# Patient Record
Sex: Male | Born: 1960 | Race: White | Hispanic: No | Marital: Single | Smoking: Former smoker
Health system: Southern US, Community
[De-identification: ages and names within clinical notes are randomized; demographics above are authoritative.]

## PROBLEM LIST (undated history)

## (undated) DIAGNOSIS — I82409 Acute embolism and thrombosis of unspecified deep veins of unspecified lower extremity: Secondary | ICD-10-CM

## (undated) DIAGNOSIS — I10 Essential (primary) hypertension: Secondary | ICD-10-CM

---

## 2020-11-12 ENCOUNTER — Encounter (HOSPITAL_COMMUNITY): Payer: Self-pay | Admitting: Student

## 2020-11-12 ENCOUNTER — Emergency Department (HOSPITAL_COMMUNITY): Payer: Self-pay

## 2020-11-12 ENCOUNTER — Inpatient Hospital Stay (HOSPITAL_COMMUNITY)
Admission: EM | Admit: 2020-11-12 | Discharge: 2020-11-18 | DRG: 270 | Disposition: A | Discharge status: Home / Self Care | Payer: Self-pay | Attending: Internal Medicine | Admitting: Internal Medicine

## 2020-11-12 ENCOUNTER — Emergency Department (HOSPITAL_BASED_OUTPATIENT_CLINIC_OR_DEPARTMENT_OTHER): Payer: Self-pay

## 2020-11-12 ENCOUNTER — Other Ambulatory Visit: Payer: Self-pay

## 2020-11-12 DIAGNOSIS — I82411 Acute embolism and thrombosis of right femoral vein: Secondary | ICD-10-CM

## 2020-11-12 DIAGNOSIS — M79605 Pain in left leg: Secondary | ICD-10-CM

## 2020-11-12 DIAGNOSIS — I82422 Acute embolism and thrombosis of left iliac vein: Secondary | ICD-10-CM

## 2020-11-12 DIAGNOSIS — E222 Syndrome of inappropriate secretion of antidiuretic hormone: Secondary | ICD-10-CM | POA: Diagnosis present

## 2020-11-12 DIAGNOSIS — I82451 Acute embolism and thrombosis of right peroneal vein: Secondary | ICD-10-CM | POA: Diagnosis present

## 2020-11-12 DIAGNOSIS — Z79891 Long term (current) use of opiate analgesic: Secondary | ICD-10-CM

## 2020-11-12 DIAGNOSIS — I10 Essential (primary) hypertension: Secondary | ICD-10-CM

## 2020-11-12 DIAGNOSIS — I7092 Chronic total occlusion of artery of the extremities: Secondary | ICD-10-CM | POA: Diagnosis present

## 2020-11-12 DIAGNOSIS — I824Y3 Acute embolism and thrombosis of unspecified deep veins of proximal lower extremity, bilateral: Secondary | ICD-10-CM

## 2020-11-12 DIAGNOSIS — I82441 Acute embolism and thrombosis of right tibial vein: Secondary | ICD-10-CM

## 2020-11-12 DIAGNOSIS — I82413 Acute embolism and thrombosis of femoral vein, bilateral: Principal | ICD-10-CM | POA: Diagnosis present

## 2020-11-12 DIAGNOSIS — D62 Acute posthemorrhagic anemia: Secondary | ICD-10-CM | POA: Diagnosis not present

## 2020-11-12 DIAGNOSIS — I82433 Acute embolism and thrombosis of popliteal vein, bilateral: Secondary | ICD-10-CM | POA: Diagnosis present

## 2020-11-12 DIAGNOSIS — I2609 Other pulmonary embolism with acute cor pulmonale: Secondary | ICD-10-CM

## 2020-11-12 DIAGNOSIS — Z20822 Contact with and (suspected) exposure to covid-19: Secondary | ICD-10-CM | POA: Diagnosis present

## 2020-11-12 DIAGNOSIS — Z7901 Long term (current) use of anticoagulants: Secondary | ICD-10-CM

## 2020-11-12 DIAGNOSIS — I82431 Acute embolism and thrombosis of right popliteal vein: Secondary | ICD-10-CM

## 2020-11-12 DIAGNOSIS — Z85828 Personal history of other malignant neoplasm of skin: Secondary | ICD-10-CM

## 2020-11-12 DIAGNOSIS — Z86711 Personal history of pulmonary embolism: Secondary | ICD-10-CM

## 2020-11-12 DIAGNOSIS — J430 Unilateral pulmonary emphysema [MacLeod's syndrome]: Secondary | ICD-10-CM | POA: Diagnosis present

## 2020-11-12 DIAGNOSIS — D6859 Other primary thrombophilia: Secondary | ICD-10-CM | POA: Diagnosis present

## 2020-11-12 DIAGNOSIS — I82461 Acute embolism and thrombosis of right calf muscular vein: Secondary | ICD-10-CM | POA: Diagnosis present

## 2020-11-12 DIAGNOSIS — Z6831 Body mass index (BMI) 31.0-31.9, adult: Secondary | ICD-10-CM

## 2020-11-12 DIAGNOSIS — I82409 Acute embolism and thrombosis of unspecified deep veins of unspecified lower extremity: Secondary | ICD-10-CM

## 2020-11-12 DIAGNOSIS — G8929 Other chronic pain: Secondary | ICD-10-CM | POA: Diagnosis present

## 2020-11-12 DIAGNOSIS — M549 Dorsalgia, unspecified: Secondary | ICD-10-CM | POA: Diagnosis present

## 2020-11-12 DIAGNOSIS — Z86718 Personal history of other venous thrombosis and embolism: Secondary | ICD-10-CM

## 2020-11-12 DIAGNOSIS — I2699 Other pulmonary embolism without acute cor pulmonale: Secondary | ICD-10-CM | POA: Diagnosis present

## 2020-11-12 DIAGNOSIS — I2510 Atherosclerotic heart disease of native coronary artery without angina pectoris: Secondary | ICD-10-CM | POA: Diagnosis present

## 2020-11-12 DIAGNOSIS — T45516A Underdosing of anticoagulants, initial encounter: Secondary | ICD-10-CM | POA: Diagnosis present

## 2020-11-12 DIAGNOSIS — E278 Other specified disorders of adrenal gland: Secondary | ICD-10-CM | POA: Diagnosis present

## 2020-11-12 DIAGNOSIS — Z79899 Other long term (current) drug therapy: Secondary | ICD-10-CM

## 2020-11-12 DIAGNOSIS — Z91120 Patient's intentional underdosing of medication regimen due to financial hardship: Secondary | ICD-10-CM

## 2020-11-12 DIAGNOSIS — M79604 Pain in right leg: Secondary | ICD-10-CM

## 2020-11-12 DIAGNOSIS — I70202 Unspecified atherosclerosis of native arteries of extremities, left leg: Secondary | ICD-10-CM | POA: Diagnosis present

## 2020-11-12 DIAGNOSIS — Z87891 Personal history of nicotine dependence: Secondary | ICD-10-CM

## 2020-11-12 DIAGNOSIS — M7989 Other specified soft tissue disorders: Secondary | ICD-10-CM

## 2020-11-12 DIAGNOSIS — I82423 Acute embolism and thrombosis of iliac vein, bilateral: Secondary | ICD-10-CM | POA: Diagnosis present

## 2020-11-12 DIAGNOSIS — E669 Obesity, unspecified: Secondary | ICD-10-CM | POA: Diagnosis present

## 2020-11-12 DIAGNOSIS — Z597 Insufficient social insurance and welfare support: Secondary | ICD-10-CM

## 2020-11-12 DIAGNOSIS — I82220 Acute embolism and thrombosis of inferior vena cava: Secondary | ICD-10-CM | POA: Diagnosis present

## 2020-11-12 DIAGNOSIS — Z95828 Presence of other vascular implants and grafts: Secondary | ICD-10-CM

## 2020-11-12 HISTORY — DX: Essential (primary) hypertension: I10

## 2020-11-12 HISTORY — DX: Acute embolism and thrombosis of unspecified deep veins of unspecified lower extremity: I82.409

## 2020-11-12 LAB — BASIC METABOLIC PANEL
Anion gap: 10 (ref 5–15)
BUN: 19 mg/dL (ref 6–20)
CO2: 25 mmol/L (ref 22–32)
Calcium: 9.3 mg/dL (ref 8.9–10.3)
Chloride: 94 mmol/L — ABNORMAL LOW (ref 98–111)
Creatinine, Ser: 1.18 mg/dL (ref 0.61–1.24)
GFR, Estimated: >60 mL/min (ref >60)
Glucose, Bld: 100 mg/dL — ABNORMAL HIGH (ref 70–99)
Potassium: 4.5 mmol/L (ref 3.5–5.1)
Sodium: 129 mmol/L — ABNORMAL LOW (ref 135–145)

## 2020-11-12 LAB — CBC
HCT: 41.3 % (ref 39.0–52.0)
Hemoglobin: 13.7 g/dL (ref 13.0–17.0)
MCH: 30.2 pg (ref 26.0–34.0)
MCHC: 33.2 g/dL (ref 30.0–36.0)
MCV: 91.2 fL (ref 80.0–100.0)
Platelets: 205 10*3/uL (ref 150–400)
RBC: 4.53 MIL/uL (ref 4.22–5.81)
RDW: 13.9 % (ref 11.5–15.5)
WBC: 7.5 10*3/uL (ref 4.0–10.5)
nRBC: 0 % (ref 0.0–0.2)

## 2020-11-12 LAB — BRAIN NATRIURETIC PEPTIDE: B Natriuretic Peptide: 15.5 pg/mL (ref 0.0–100.0)

## 2020-11-12 LAB — PROCALCITONIN: Procalcitonin: <0.1 ng/mL

## 2020-11-12 LAB — RESP PANEL BY RT-PCR (FLU A&B, COVID) ARPGX2
Influenza A by PCR: NEGATIVE
Influenza B by PCR: NEGATIVE
SARS Coronavirus 2 by RT PCR: NEGATIVE

## 2020-11-12 LAB — APTT
aPTT: 36 seconds (ref 24–36)
aPTT: 75 seconds — ABNORMAL HIGH (ref 24–36)

## 2020-11-12 LAB — MAGNESIUM: Magnesium: 2.2 mg/dL (ref 1.7–2.4)

## 2020-11-12 LAB — PROTIME-INR
INR: 1.1 (ref 0.8–1.2)
Prothrombin Time: 14.2 seconds (ref 11.4–15.2)

## 2020-11-12 LAB — PHOSPHORUS: Phosphorus: 3.3 mg/dL (ref 2.5–4.6)

## 2020-11-12 LAB — TROPONIN I (HIGH SENSITIVITY): Troponin I (High Sensitivity): 40 ng/L — ABNORMAL HIGH (ref <18)

## 2020-11-12 LAB — HEPARIN LEVEL (UNFRACTIONATED): Heparin Unfractionated: >1.1 IU/mL — ABNORMAL HIGH (ref 0.30–0.70)

## 2020-11-12 LAB — OSMOLALITY: Osmolality: 270 mOsm/kg — ABNORMAL LOW (ref 275–295)

## 2020-11-12 MED ORDER — HYDROMORPHONE HCL 1 MG/ML IJ SOLN
0.5000 mg | INTRAMUSCULAR | Status: DC | PRN
  Administered 2020-11-12 – 2020-11-15 (×8): 1 mg via INTRAVENOUS
  Filled 2020-11-12 (×9): qty 1

## 2020-11-12 MED ORDER — POLYETHYLENE GLYCOL 3350 17 G PO PACK: 17.0000 g | PACK | Freq: Every day | ORAL | Status: DC | PRN

## 2020-11-12 MED ORDER — ACETAMINOPHEN 325 MG PO TABS: 650.0000 mg | ORAL_TABLET | Freq: Four times a day (QID) | ORAL | Status: DC | PRN

## 2020-11-12 MED ORDER — THIAMINE HCL 100 MG/ML IJ SOLN: 100.0000 mg | Freq: Every day | INTRAMUSCULAR | Status: DC

## 2020-11-12 MED ORDER — AMLODIPINE BESYLATE 5 MG PO TABS
5.0000 mg | ORAL_TABLET | Freq: Every day | ORAL | Status: DC
  Administered 2020-11-12 – 2020-11-18 (×6): 5 mg via ORAL
  Filled 2020-11-12 (×6): qty 1

## 2020-11-12 MED ORDER — GABAPENTIN 100 MG PO CAPS
100.0000 mg | ORAL_CAPSULE | Freq: Three times a day (TID) | ORAL | Status: DC
  Administered 2020-11-13 – 2020-11-18 (×15): 100 mg via ORAL
  Filled 2020-11-12 (×15): qty 1

## 2020-11-12 MED ORDER — HEPARIN (PORCINE) 25000 UT/250ML-% IV SOLN
2100.0000 [IU]/h | INTRAVENOUS | Status: DC
  Administered 2020-11-12: 1700 [IU]/h via INTRAVENOUS
  Administered 2020-11-13: 2100 [IU]/h via INTRAVENOUS
  Administered 2020-11-13: 1700 [IU]/h via INTRAVENOUS
  Administered 2020-11-14: 2100 [IU]/h via INTRAVENOUS
  Filled 2020-11-12 (×4): qty 250

## 2020-11-12 MED ORDER — ADULT MULTIVITAMIN W/MINERALS CH
1.0000 | ORAL_TABLET | Freq: Every day | ORAL | Status: DC
  Administered 2020-11-12 – 2020-11-18 (×6): 1 via ORAL
  Filled 2020-11-12 (×6): qty 1

## 2020-11-12 MED ORDER — THIAMINE HCL 100 MG PO TABS
100.0000 mg | ORAL_TABLET | Freq: Every day | ORAL | Status: DC
  Administered 2020-11-12 – 2020-11-18 (×6): 100 mg via ORAL
  Filled 2020-11-12 (×6): qty 1

## 2020-11-12 MED ORDER — LORAZEPAM 2 MG/ML IJ SOLN: 1.0000 mg | INTRAMUSCULAR | Status: AC | PRN

## 2020-11-12 MED ORDER — ACETAMINOPHEN 650 MG RE SUPP: 650.0000 mg | Freq: Four times a day (QID) | RECTAL | Status: DC | PRN

## 2020-11-12 MED ORDER — MORPHINE SULFATE (PF) 4 MG/ML IV SOLN
4.0000 mg | Freq: Once | INTRAVENOUS | Status: AC
  Administered 2020-11-12: 4 mg via INTRAVENOUS
  Filled 2020-11-12: qty 1

## 2020-11-12 MED ORDER — HEPARIN BOLUS VIA INFUSION
6000.0000 [IU] | Freq: Once | INTRAVENOUS | Status: AC
  Administered 2020-11-12: 6000 [IU] via INTRAVENOUS
  Filled 2020-11-12: qty 6000

## 2020-11-12 MED ORDER — LORAZEPAM 1 MG PO TABS
1.0000 mg | ORAL_TABLET | ORAL | Status: AC | PRN
  Administered 2020-11-14: 2 mg via ORAL
  Filled 2020-11-12: qty 2

## 2020-11-12 MED ORDER — CYCLOBENZAPRINE HCL 10 MG PO TABS
10.0000 mg | ORAL_TABLET | Freq: Three times a day (TID) | ORAL | Status: DC
  Administered 2020-11-13 – 2020-11-18 (×15): 10 mg via ORAL
  Filled 2020-11-12 (×17): qty 1

## 2020-11-12 MED ORDER — AMOXICILLIN-POT CLAVULANATE 875-125 MG PO TABS
1.0000 | ORAL_TABLET | Freq: Two times a day (BID) | ORAL | Status: DC
  Administered 2020-11-12 – 2020-11-18 (×11): 1 via ORAL
  Filled 2020-11-12 (×13): qty 1

## 2020-11-12 MED ORDER — FOLIC ACID 1 MG PO TABS
1.0000 mg | ORAL_TABLET | Freq: Every day | ORAL | Status: DC
  Administered 2020-11-12 – 2020-11-18 (×6): 1 mg via ORAL
  Filled 2020-11-12 (×6): qty 1

## 2020-11-12 MED ORDER — IOHEXOL 350 MG/ML SOLN
80.0000 mL | Freq: Once | INTRAVENOUS | Status: AC | PRN
  Administered 2020-11-12: 80 mL via INTRAVENOUS

## 2020-11-12 NOTE — ED Notes (Signed)
[facility] called for pt transport to [facility]

## 2020-11-12 NOTE — ED Triage Notes (Addendum)
Pt BIB EMS from home. Pt went to hospital on Saturday for back problems and was started on 2 pain medications and a BP medication. Pt noticed bilateral leg swelling after leaving to hospital. Pt has extensive hx of blood clot.

## 2020-11-12 NOTE — ED Notes (Signed)
Vascular at bedside

## 2020-11-12 NOTE — ED Provider Notes (Signed)
[facility] DEPT Provider Note   CSN: [id number] Arrival date & time: 11/12/20  1132     History Chief Complaint  Patient presents with   Leg Swelling   Leg Pain    Edgar Olson is a 60 y.o. male.   Leg Pain  Patient presents to the ED for evaluation of right leg swelling.  Patient has history of DVT.  He was switched from Xarelto to Eliquis due to cost issues last weekend.  Patient states he was in a hospital for treatment of back pain and during that treatment was switched to Eliquis as it was somewhat cheaper.  Patient states he has not missed any Xarelto or Eliquis doses.  He drove from [place] to [place] for work.  Patient noticed after that drive that he started having swelling in his right leg.  It moves up towards his thigh.  Patient states that his left leg is at baseline and is not swollen.  He is not having any chest pain or shortness of breath.  No fevers or chills.  Past Medical History:  Diagnosis Date   DVT (deep venous thrombosis) ([place])    Hypertension     There are no problems to display for this patient.   History reviewed. No pertinent surgical history.     History reviewed. No pertinent family history.  Social History   Tobacco Use   Smoking status: Former    Pack years: 0.00    Types: Cigarettes    Quit date: 2018    Years since quitting: 4.5   Smokeless tobacco: Never  Vaping Use   Vaping Use: Never used  Substance Use Topics   Alcohol use: Yes    Alcohol/week: 42.0 standard drinks    Types: 42 Cans of beer per week    Comment: 6 beers a night   Drug use: Yes    Frequency: 1.0 times per week    Types: Marijuana    Home Medications Prior to Admission medications   Medication Sig Start Date End Date Taking? Authorizing Provider  amLODipine (NORVASC) 5 MG tablet Take 5 mg by mouth daily.   Yes [provider]  apixaban (ELIQUIS) 5 MG TABS tablet Take 5 mg by mouth 2 (two) times daily.    Yes [provider]  cyclobenzaprine (FLEXERIL) 10 MG tablet Take 10 mg by mouth in the morning, at noon, and at bedtime.   Yes [provider]  gabapentin (NEURONTIN) 100 MG capsule Take 100 mg by mouth 3 (three) times daily.   Yes [provider]  oxyCODONE-acetaminophen (PERCOCET/ROXICET) 5-325 MG tablet Take 1 tablet by mouth every 6 (six) hours.   Yes [provider]    Allergies    Patient has no known allergies.  Review of Systems   Review of Systems  All other systems reviewed and are negative.  Physical Exam Updated Vital Signs BP (!) 154/100   Pulse 92   Temp 98 F (36.7 C) (Oral)   Resp (!) 21   Ht 1.835 m (6' 0.25")   Wt 99.8 kg   SpO2 96%   BMI 29.63 kg/m   Physical Exam Vitals and nursing note reviewed.  Constitutional:      General: He is not in acute distress.    Appearance: He is well-developed.  HENT:     Head: Normocephalic and atraumatic.     Right Ear: External ear normal.     Left Ear: External ear normal.  Eyes:  General: No scleral icterus.       Right eye: No discharge.        Left eye: No discharge.     Conjunctiva/sclera: Conjunctivae normal.  Neck:     Trachea: No tracheal deviation.  Cardiovascular:     Rate and Rhythm: Normal rate and regular rhythm.  Pulmonary:     Effort: Pulmonary effort is normal. No respiratory distress.     Breath sounds: Normal breath sounds. No stridor. No wheezing or rales.  Abdominal:     General: Bowel sounds are normal. There is no distension.     Palpations: Abdomen is soft.     Tenderness: There is no abdominal tenderness. There is no guarding or rebound.  Musculoskeletal:        General: Swelling present. No tenderness or deformity.     Cervical back: Neck supple.     Right lower leg: Edema present.  Skin:    General: Skin is warm and dry.     Findings: No rash.  Neurological:     General: No focal deficit present.     Mental Status: He is alert.      Cranial Nerves: No cranial nerve deficit (no facial droop, extraocular movements intact, no slurred speech).     Sensory: No sensory deficit.     Motor: No abnormal muscle tone or seizure activity.     Coordination: Coordination normal.  Psychiatric:        Mood and Affect: Mood normal.    ED Results / Procedures / Treatments   Labs (all labs ordered are listed, but only abnormal results are displayed) Labs Reviewed  BASIC METABOLIC PANEL - Abnormal; Notable for the following components:      Result Value   Sodium 129 (*)    Chloride 94 (*)    Glucose, Bld 100 (*)    All other components within normal limits  CBC  PROTIME-INR  APTT  TROPONIN I (HIGH SENSITIVITY)    EKG EKG Interpretation  Date/Time:  Tuesday November 12 2020 14:40:42 EDT Ventricular Rate:  84 PR Interval:  174 QRS Duration: 110 QT Interval:  366 QTC Calculation: 433 R Axis:   15 Text Interpretation: Sinus rhythm Probable left atrial enlargement Left ventricular hypertrophy No old tracing to compare Confirmed by Dorie Rank [phone]) on 11/12/2020 2:43:11 PM  Radiology DG Abd 1 View  Result Date: 11/12/2020 CLINICAL DATA:  Encounter for IVC location. Patient reports IVC filter was placed 2 years ago. EXAM: ABDOMEN - 1 VIEW COMPARISON:  None. FINDINGS: IVC filter is seen to the right of L2-L3. normal visualized bowel gas pattern. Excreted IV contrast within both renal collecting systems from prior CT. Kyphoplasty within lower thoracic vertebra, partially included. IMPRESSION: IVC filter to the right of L2-L3. Electronically Signed   By: Keith Rake M.D.   On: 11/12/2020 16:10   CT Angio Chest PE W and/or Wo Contrast  Result Date: 11/12/2020 CLINICAL DATA:  Bilateral lower extremity swelling. EXAM: CT ANGIOGRAPHY CHEST WITH CONTRAST TECHNIQUE: Multidetector CT imaging of the chest was performed using the standard protocol during bolus administration of intravenous contrast. Multiplanar CT image reconstructions and  MIPs were obtained to evaluate the vascular anatomy. CONTRAST:  67mL OMNIPAQUE IOHEXOL 350 MG/ML SOLN COMPARISON:  None. FINDINGS: Cardiovascular: Large linear pulmonary embolus is noted in the distal right pulmonary artery which extends into the upper and lower lobe branches. RV/LV ratio of 1.4 is noted suggesting right heart strain. Normal cardiac size. Coronary artery calcifications are  noted. No pericardial effusion is noted. Filling defect is also seen in upper lobe branch of the left pulmonary artery consistent with pulmonary embolus. Mediastinum/Nodes: No enlarged mediastinal, hilar, or axillary lymph nodes. Thyroid gland, trachea, and esophagus demonstrate no significant findings. Lungs/Pleura: No pneumothorax or pleural effusion is noted. 3.8 x 1.9 cm fluid-filled cavitary abnormality is noted posteriorly in the right lower lobe concerning for possible abscess or infected bulla. Upper Abdomen: 1.8 cm right adrenal nodule is noted. Musculoskeletal: Status post kyphoplasty at 2 lower thoracic levels. Review of the MIP images confirms the above findings. IMPRESSION: Large linear pulmonary embolus is noted in the distal right pulmonary artery which extends into the upper and lower lobe branches. Smaller embolus is seen in upper lobe branch of left pulmonary artery. Positive for acute PE with CT evidence of right heart strain (RV/LV Ratio = 1.4) consistent with at least submassive (intermediate risk) PE. The presence of right heart strain has been associated with an increased risk of morbidity and mortality. Please refer to the "PE Focused" order set in EPIC. 3.8 x 1.9 cm fluid-filled cavitary abnormality is noted posteriorly in the right lower lobe concerning for possible abscess or infected bulla. Critical Value/emergent results were called by telephone at the time of interpretation on 11/12/2020 at 4:07 pm to provider [facility] , who verbally acknowledged these results. 1.8 cm right adrenal nodule is noted.  Follow-up CT or MRI in 12 months is recommended to ensure stability. Electronically Signed   By: Marijo Conception M.D.   On: 11/12/2020 16:09   VAS [place] LOWER EXTREMITY VENOUS (DVT) (ONLY [facility] & [facility])  Result Date: 11/12/2020  Lower Venous DVT Study Patient Name:  TYKE OUTMAN  Date of Exam:   11/12/2020 Medical Rec #: [id number]        Accession #:    [id number] Date of Birth: [birth year]        Patient Gender: M Patient Age:   59Y Exam Location:  [facility] Procedure:      VAS [place] LOWER EXTREMITY VENOUS (DVT) Referring Phys: [id number] Adan Beal --------------------------------------------------------------------------------  Indications: Swelling, and Pain.  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 Anticoagulation: Eliquis. Limitations: Poor ultrasound/tissue interface. Performing Technologist: Rogelia Rohrer RVT, RDMS  Examination Guidelines: A complete evaluation includes B-mode imaging, spectral Doppler, color Doppler, and power Doppler as needed of all accessible portions of each vessel. Bilateral testing is considered an integral part of a complete examination. Limited examinations for reoccurring indications may be performed as noted. The reflux portion of the exam is performed with the patient in reverse Trendelenburg.  +---------+---------------+---------+-----------+----------+-----------------+ RIGHT    CompressibilityPhasicitySpontaneityPropertiesThrombus Aging    +---------+---------------+---------+-----------+----------+-----------------+ CFV      Partial        Yes      Yes                  Age Indeterminate +---------+---------------+---------+-----------+----------+-----------------+ SFJ      Partial                                      Acute             +---------+---------------+---------+-----------+----------+-----------------+ FV Prox  None           No       No  Acute              +---------+---------------+---------+-----------+----------+-----------------+ FV Mid   None           No       No                   Acute             +---------+---------------+---------+-----------+----------+-----------------+ FV DistalNone           No       No                   Acute             +---------+---------------+---------+-----------+----------+-----------------+ PFV      None           No       No                   Acute             +---------+---------------+---------+-----------+----------+-----------------+ POP      None           No       No                   Acute             +---------+---------------+---------+-----------+----------+-----------------+ PTV      None           No       No                   Acute             +---------+---------------+---------+-----------+----------+-----------------+ PERO     None           No       No                   Acute             +---------+---------------+---------+-----------+----------+-----------------+ Gastroc  None           No       No                   Acute             +---------+---------------+---------+-----------+----------+-----------------+   +---------+---------------+---------+-----------+----------+--------------+ LEFT     CompressibilityPhasicitySpontaneityPropertiesThrombus Aging +---------+---------------+---------+-----------+----------+--------------+ CFV      Partial        Yes      Yes                  Acute          +---------+---------------+---------+-----------+----------+--------------+ SFJ      Partial                                      Acute          +---------+---------------+---------+-----------+----------+--------------+ FV Prox  Full           Yes      Yes                                 +---------+---------------+---------+-----------+----------+--------------+ FV Mid   None           No       No  Acute           +---------+---------------+---------+-----------+----------+--------------+ FV DistalNone           No       No                   Acute          +---------+---------------+---------+-----------+----------+--------------+ PFV      Full           Yes      Yes                                 +---------+---------------+---------+-----------+----------+--------------+ POP      Partial        No       No                   Acute          +---------+---------------+---------+-----------+----------+--------------+ PTV      Full                                                        +---------+---------------+---------+-----------+----------+--------------+ PERO     Full                                                        +---------+---------------+---------+-----------+----------+--------------+     Summary: BILATERAL: -No evidence of popliteal cyst, bilaterally. RIGHT: - Findings consistent with acute deep vein thrombosis involving the SF junction, right femoral vein, right proximal profunda vein, right popliteal vein, right posterior tibial veins, right peroneal veins, and right gastrocnemius veins. - Findings consistent with age indeterminate deep vein thrombosis involving the right common femoral vein. - There is no evidence of superficial venous thrombosis.  LEFT: - Findings consistent with acute deep vein thrombosis involving the left common femoral vein, SF junction, left femoral vein, and left popliteal vein.  *See table(s) above for measurements and observations. Electronically signed by Ruta Hinds MD on 11/12/2020 at 3:53:09 PM.    Final    VAS US AORTA/IVC/ILIACS  Result Date: 11/12/2020 IVC/ILIAC STUDY Patient Name:  ANN GROENEVELD  Date of Exam:   11/12/2020 Medical Rec #: [id number]        Accession #:    [id number] Date of Birth: [birth year]        Patient Gender: M Patient Age:   59Y Exam Location:  [facility] Procedure:      VAS US AORTA/IVC/ILIACS Referring  Phys: [id number] Sherina Stammer --------------------------------------------------------------------------------  Indications: DVT OF BLE with extension into iliac system. 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                Paitent states he has IV filter also. Medical records unavailable                ([place]  Comparison Study: No previous exams in our system. Performing Technologist: Rogelia Rohrer RVT, RDMS  Examination Guidelines: A complete evaluation includes B-mode imaging, spectral Doppler, color Doppler, and power Doppler as needed of all accessible portions  of each vessel. Bilateral testing is considered an integral part of a complete examination. Limited examinations for reoccurring indications may be performed as noted.  IVC/Iliac Findings: +----------+------+--------+------------------------------+    IVC    PatentThrombus           Comments            +----------+------+--------+------------------------------+ IVC Prox  patent                                       +----------+------+--------+------------------------------+ IVC Mid         chronic mixed thrombus - acute/chronic +----------+------+--------+------------------------------+ IVC Distal       acute                                 +----------+------+--------+------------------------------+  +-------------+---------+-----------+---------+-----------+--------------------+      CIV     RT-PatentRT-ThrombusLT-PatentLT-Thrombus      Comments       +-------------+---------+-----------+---------+-----------+--------------------+ Common Iliac            chronic              acute    RT mixed thrombus   Mid                                                     acute/chronic     +-------------+---------+-----------+---------+-----------+--------------------+  +--------------+---------+-----------+---------+-----------+-------------------+      EIV       RT-PatentRT-ThrombusLT-PatentLT-Thrombus     Comments       +--------------+---------+-----------+---------+-----------+-------------------+ External Iliac           chronic              acute   RT mixed thrombus - Vein Mid                                                 acute/chronic    +--------------+---------+-----------+---------+-----------+-------------------+   Summary: IVC/Iliac: There is evidence of age indeterminate thrombus involving the IVC. There is evidence of age indeterminate thrombus involving the right common iliac vein. There is evidence of acute thrombus involving the left common iliac vein. There is evidence of age indeterminate thrombus involving the right external iliac vein. There is evidence of acute thrombus involving the left external iliac vein.  *See table(s) above for measurements and observations.  Electronically signed by Ruta Hinds MD on 11/12/2020 at 3:47:02 PM.    Final     Procedures .Critical Care  Date/Time: 11/12/2020 4:14 PM Performed by: Dorie Rank, MD Authorized by: Dorie Rank, MD   Critical care provider statement:    Critical care time (minutes):  45   Critical care was time spent personally by me on the following activities:  Discussions with consultants, evaluation of patient's response to treatment, examination of patient, ordering and performing treatments and interventions, ordering and review of laboratory studies, ordering and review of radiographic studies, pulse oximetry, re-evaluation of patient's condition, obtaining history from patient or surrogate and review of old charts   Medications Ordered in ED Medications  morphine 4 MG/ML injection 4 mg (4 mg Intravenous  Given 11/12/20 1439)  iohexol (OMNIPAQUE) 350 MG/ML injection 80 mL (80 mLs Intravenous Contrast Given 11/12/20 1542)    ED Course  I have reviewed the triage vital signs and the nursing notes.  Pertinent labs & imaging results that were available during my care  of the patient were reviewed by me and considered in my medical decision making (see chart for details).  Clinical Course as of 11/12/20 1613  Tue Nov 12, 2020  1428 Patient is now complaining of chest pain.  Will add on CT angiogram and an EKG [JK]  1428 Discussed with Dr. Oneida Alar.  He will consult on patient [JK]  1429 extensive clot, R>L. into IVC as well verbal report from tech.   Prelim report not available yet  [JK]  1610 CT scan also shows PE [JK]    Clinical Course User Index [JK] Dorie Rank, MD   MDM Rules/Calculators/A&P                          Patient presented to the ED for evaluation of worsening leg swelling.  Patient has known history of DVT.  He also has a IVC filter.  Patient states he has been compliant with his anticoagulant medications.  Patient is hemodynamically stable without signs of hypotension or tachycardia however he does have evidence of DVT involving the IVC.  Patient started complaining of chest pain while he is here so CT scan was performed and it does show pulmonary embolism.  I have started the patient on IV heparin.  I will consult with vascular surgery to see if he is a candidate for any further intervention.  I will consult the medical service for admission. Final Clinical Impression(s) / ED Diagnoses Final diagnoses:  Deep vein thrombosis (DVT) of proximal vein of both lower extremities, unspecified chronicity ([place])  Acute pulmonary embolism with acute cor pulmonale, unspecified pulmonary embolism type [facility])    Rx / DC Orders ED Discharge Orders     None        Dorie Rank, MD 11/12/20 1614

## 2020-11-12 NOTE — Progress Notes (Signed)
BLE venous duplex & IVC/Iliacs duplex have been completed.  Critical findings given to Dr. Tomi Bamberger.  Results can be found under chart review under CV PROC. 11/12/2020 3:50 PM Charmaine Placido RVT, RDMS

## 2020-11-12 NOTE — Consult Note (Signed)
NAME:  Edgar Olson, MRN:  [id number], DOB:  1961/01/09, LOS: 0 ADMISSION DATE:  11/12/2020, CONSULTATION DATE:  11/12/20 REFERRING MD: Hildred Laser CHIEF COMPLAINT: Chest pain  History of Present Illness:  60 year old with history of recurrent blood clots who was admitted with submassive PE and incidental discovery of right lower lobe cavitary lesion for which we are consulted.  H&P reviewed.  Vascular surgery consult note reviewed.  Patient in the ED with leg swelling.  Similar to prior presentation with clots.  I received filter placed 2 years ago.  Has been on Eliquis since.  Recently moved to the area.  Flying back and forth.  In the hospital for couple days 6/20 through 7/2 with low back pain.  Drove to [place] the next day.  Right leg was more swollen, up to the thigh.  Left leg looked a bit more swollen but not as bad.  Had chest pain.  Came to the ED.  CTA PE protocol with right greater than left clot, scattered bullae or cystic lung lesions primarily on the right with a large right lower lobe bullae with air-fluid level.  Procalcitonin negative.  Labs notable for hyponatremia.  Creatinine 1.18.  Troponin mildly elevated at 40.  CBC unremarkable.  Denies any cough.  Occasional brief sweats during the day.  Homeless for up to a year at a time.  4 days in jail.  He denies any risky sexual behavior.  No IV drug use.  Used to smoke cigarettes.  At least 2 packs a day.  Up to 40 years.  Sometimes more.  Smoke marijuana as well.  Quit cigarette smoking a few years ago with the PE.  Denies smoking other illicit substances.  Pertinent  Medical History  Recurrent blood clots  Significant Hospital Events: Including procedures, antibiotic start and stop dates in addition to other pertinent events   Admitted, CTA with PE, submassive, concern for thrombosed IVC filter  Interim History / Subjective:  Admitted with submassive PE, incidental right lower lobe cavitary lesion with air-fluid  level  Objective   Blood pressure (!) 154/105, pulse 92, temperature 98 F (36.7 C), temperature source Oral, resp. rate 20, height 6' 0.25" (1.835 m), weight 99.8 kg, SpO2 99 %.       No intake or output data in the 24 hours ending 11/12/20 1905 Filed Weights   11/12/20 1151  Weight: 99.8 kg    Examination: General: NAD, in stretcher Neck: Supple, no JVP Eyes: EOMI, no icterus Lungs: Clear to auscultation, normal work of breathing Cardiovascular: Regular rate and rhythm, no murmur Abdomen: Nondistended, bowel sounds present MSK: No synovitis, no joint effusion Neuro: No weakness, sensation intact Psych: Normal mood, full affect  Resolved Hospital Problem list     Assessment & Plan:  Cavitary lung lesion with air fluid level: suspect most likely related to aspiration vs CAP and represents abscess. He has no cough, not producing sputum. Location is atypical for TB - risk factor being h/o homelessness. Not consistent with active pulmonary TB based on symptoms. Vasculitis possible, infected bullae possible. Lastly possible infected area of infarct given PEs.  --Augmentin BID for at least 4 weeks --No role for bronchoscopy, no identified immune deficiency --No suspicion for active pulmonary TB, could consider quantiferon gold  test but no role for isolation and smears --ANCA ordered, send UA --If ultimate diagnosis is pulmonary abscess, will need antibiotics for length as above with repeat CT scan in 4 weeks to assess for improvement  Acute PE: PESI  score 67, low risk. Submassive with trop leak. --Heparin --TTE  PCCM will continue to follow.  Best Practice (right click and "Reselect all SmartList Selections" daily)   Per Primary  Labs   CBC: Recent Labs  Lab 11/12/20 1340  WBC 7.5  HGB 13.7  HCT 41.3  MCV 91.2  PLT 481    Basic Metabolic Panel: Recent Labs  Lab 11/12/20 1340  NA 129*  K 4.5  CL 94*  CO2 25  GLUCOSE 100*  BUN 19  CREATININE 1.18  CALCIUM  9.3   GFR: Estimated Creatinine Clearance: 84.8 mL/min (by C-G formula based on SCr of 1.18 mg/dL). Recent Labs  Lab 11/12/20 1340 11/12/20 1711  PROCALCITON  --  <0.10  WBC 7.5  --     Liver Function Tests: No results for input(s): AST, ALT, ALKPHOS, BILITOT, PROT, ALBUMIN in the last 168 hours. No results for input(s): LIPASE, AMYLASE in the last 168 hours. No results for input(s): AMMONIA in the last 168 hours.  ABG No results found for: PHART, PCO2ART, PO2ART, HCO3, TCO2, ACIDBASEDEF, O2SAT   Coagulation Profile: Recent Labs  Lab 11/12/20 1340  INR 1.1    Cardiac Enzymes: No results for input(s): CKTOTAL, CKMB, CKMBINDEX, TROPONINI in the last 168 hours.  HbA1C: No results found for: HGBA1C  CBG: No results for input(s): GLUCAP in the last 168 hours.  Review of Systems:   No CP, orthopnea, PND. Comprehensive ROS otherwise negative.   Past Medical History:  He,  has a past medical history of DVT (deep venous thrombosis) ([place]) and Hypertension.   Surgical History:  History reviewed. No pertinent surgical history.   Social History:   reports that he quit smoking about 4 years ago. His smoking use included cigarettes. He has never used smokeless tobacco. He reports current alcohol use of about 42.0 standard drinks of alcohol per week. He reports current drug use. Frequency: 1.00 time per week. Drug: Marijuana.   Family History:  His family history is not on file.   Allergies No Known Allergies   Home Medications  Prior to Admission medications   Medication Sig Start Date End Date Taking? Authorizing Provider  amLODipine (NORVASC) 5 MG tablet Take 5 mg by mouth daily.   Yes [provider]  apixaban (ELIQUIS) 5 MG TABS tablet Take 5 mg by mouth 2 (two) times daily.   Yes [provider]  cyclobenzaprine (FLEXERIL) 10 MG tablet Take 10 mg by mouth in the morning, at noon, and at bedtime.   Yes [provider]  gabapentin (NEURONTIN)  100 MG capsule Take 100 mg by mouth 3 (three) times daily.   Yes [provider]  oxyCODONE-acetaminophen (PERCOCET/ROXICET) 5-325 MG tablet Take 1 tablet by mouth every 6 (six) hours.   Yes [provider]     Critical care time: n/a

## 2020-11-12 NOTE — ED Notes (Signed)
Attempted to call report on pt to [place] at [facility]. Charge nurse will call me back for report

## 2020-11-12 NOTE — H&P (Addendum)
History and Physical        Hospital Admission Note Date: 11/12/2020  Patient name: Edgar Olson Medical record number: [id number] Date of birth: Sep 23, 1960 Age: 60 y.o. Gender: male  PCP: Patient is visiting from [place]    Patient coming from: Patient is visiting from [place]  I have reviewed all records in the [facility].    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  Patient reported that initially he was on Xarelto however he could not afford it and was recently then switched to Eliquis.  Patient reports that he has been visiting [facility] for a job, flew back to [place] last week, was admitted to [facility] in [place] on Thursday 11/07/2020 for acute back pain.  Patient was released on Saturday, 11/09/2020 and he drove down to [place] o next day.  Patient noticed that after the drive he started having swelling in his right leg which has been progressively worsening up to his thigh.  His left leg also appears to be swollen but is close to the baseline.  Today he was having chest pain which concerned him to come to ED.  At the time of my examination chest pain 4/10, sharp and intermittent.  No acute shortness of breath.  No fevers or chills or coughing.  Patient reports compliance with Eliquis and has not missed doses.   ED work-up/course:  In ED, patient noted to have temp 98 F, respirate 16, pulse 96, BP 166/96, O2 sats 98-100% on room air CBC unremarkable, sodium 129, chloride 94, glucose 100, creatinine 1.18 EKG showed rate 84, normal sinus rhythm, LVH  CT angiogram chest showed largely no pulmonary embolism in the distal right pulmonary artery extending into the  upper and lower lobe branches.  Smaller embolus in the upper lobe branch of the left pulmonary artery.  CT evidence of right heart strain consistent with at least submassive PE. 3.8x 1.9 cm fluid-filled cavitary abnormality noted posteriorly in the right lower lobe concerning for possible abscess or infected bulla  Ultrasound of the lower extremity showed acute DVT in the right SFA junction, right femoral, right proximal profunda vein, right popliteal vein, right posterior tibial vein, right peroneal vein and right gastrocnemius vein. Acute DVT in the left common femoral, SF junction, left femoral vein, left popliteal vein  Vascular ultrasound aorta/iliacs showed age indeterminate thrombus involving the IVC.  Age-indeterminate thrombus involving the right common iliac vein, left common iliac vein.  Age indeterminate thrombus involving the right external and left external iliac vein.   Review of Systems: Positives marked in 'bold' Constitutional: Denies fever, chills, diaphoresis, poor appetite and fatigue.  HEENT: Denies photophobia, eye pain, redness, hearing loss, ear pain, congestion, sore throat, rhinorrhea, sneezing, mouth sores, trouble swallowing, neck pain, neck stiffness and tinnitus.   Respiratory: Please see HPI Cardiovascular: Please see HPI Gastrointestinal: Denies nausea, vomiting, abdominal pain, diarrhea, constipation, blood in stool and abdominal distention.  Genitourinary: Denies dysuria, urgency, frequency, hematuria, flank  pain and difficulty urinating.  Musculoskeletal: Denies myalgias,  joint swelling, arthralgias and gait problem. + Chronic back pain Skin: Denies pallor, rash and wound.  Neurological: Denies dizziness, seizures, syncope, weakness, light-headedness, numbness and headaches.  Hematological: Denies adenopathy. Easy bruising, personal or family bleeding history  Psychiatric/Behavioral: Denies suicidal ideation, mood changes, confusion, nervousness, sleep  disturbance and agitation  Past Medical History: Past Medical History:  Diagnosis Date   DVT (deep venous thrombosis) (HCC)    Hypertension     Past surgical history None  Medications: Prior to Admission medications   Medication Sig Start Date End Date Taking? Authorizing Provider  amLODipine (NORVASC) 5 MG tablet Take 5 mg by mouth daily.   Yes [provider]  apixaban (ELIQUIS) 5 MG TABS tablet Take 5 mg by mouth 2 (two) times daily.   Yes [provider]  cyclobenzaprine (FLEXERIL) 10 MG tablet Take 10 mg by mouth in the morning, at noon, and at bedtime.   Yes [provider]  gabapentin (NEURONTIN) 100 MG capsule Take 100 mg by mouth 3 (three) times daily.   Yes [provider]  oxyCODONE-acetaminophen (PERCOCET/ROXICET) 5-325 MG tablet Take 1 tablet by mouth every 6 (six) hours.   Yes [provider]    Allergies:  No Known Allergies  Social History:  reports that he quit smoking about 4 years ago. His smoking use included cigarettes. He has never used smokeless tobacco. He reports current alcohol use of about 42.0 standard drinks of alcohol per week. He reports current drug use. Frequency: 1.00 time per week. Drug: Marijuana.  Family History: Patient reports that his mother died of PE otherwise no prior history of blood clots or blood clots in the family  Physical Exam: Blood pressure (!) 154/105, pulse 92, temperature 98 F (36.7 C), temperature source Oral, resp. rate 20, height 6' 0.25" (1.835 m), weight 99.8 kg, SpO2 99 %. General: Alert, awake, oriented x3, in no acute distress. Eyes: pink conjunctiva,anicteric sclera, pupils equal and reactive to light and accomodation, HEENT: normocephalic, atraumatic, oropharynx clear Neck: supple, no masses or lymphadenopathy, no goiter, no bruits, no JVD CVS: Regular rate and rhythm, without murmurs, rubs or gallops. No lower extremity edema Resp : Clear to auscultation bilaterally,  no wheezing, rales or rhonchi. GI : Soft, nontender, nondistended, positive bowel sounds, no masses. No hepatomegaly. No hernia.  Musculoskeletal: Right leg edema up to thigh, L LE edema, at baseline.  Both lower extremity with erythema Neuro: Grossly intact, no focal neurological deficits, strength 5/5 upper and lower extremities bilaterally Psych: alert and oriented x 3, normal mood and affect Skin: no rashes or lesions, warm and dry   LABS on Admission: I have personally reviewed all the labs and imagings below    Basic Metabolic Panel: Recent Labs  Lab 11/12/20 1340  NA 129*  K 4.5  CL 94*  CO2 25  GLUCOSE 100*  BUN 19  CREATININE 1.18  CALCIUM 9.3   Liver Function Tests: No results for input(s): AST, ALT, ALKPHOS, BILITOT, PROT, ALBUMIN in the last 168 hours. No results for input(s): LIPASE, AMYLASE in the last 168 hours. No results for input(s): AMMONIA in the last 168 hours. CBC: Recent Labs  Lab 11/12/20 1340  WBC 7.5  HGB 13.7  HCT 41.3  MCV 91.2  PLT 205   Cardiac Enzymes: No results for input(s): CKTOTAL, CKMB, CKMBINDEX, TROPONINI in the last 168 hours. BNP: Invalid input(s): POCBNP CBG: No results for input(s): GLUCAP in the last  168 hours.  Radiological Exams on Admission:  DG Abd 1 View  Result Date: 11/12/2020 CLINICAL DATA:  Encounter for IVC location. Patient reports IVC filter was placed 2 years ago. EXAM: ABDOMEN - 1 VIEW COMPARISON:  None. FINDINGS: IVC filter is seen to the right of L2-L3. normal visualized bowel gas pattern. Excreted IV contrast within both renal collecting systems from prior CT. Kyphoplasty within lower thoracic vertebra, partially included. IMPRESSION: IVC filter to the right of L2-L3. Electronically Signed   By: Keith Rake M.D.   On: 11/12/2020 16:10   CT Angio Chest PE W and/or Wo Contrast  Result Date: 11/12/2020 CLINICAL DATA:  Bilateral lower extremity swelling. EXAM: CT ANGIOGRAPHY CHEST WITH CONTRAST TECHNIQUE:  Multidetector CT imaging of the chest was performed using the standard protocol during bolus administration of intravenous contrast. Multiplanar CT image reconstructions and MIPs were obtained to evaluate the vascular anatomy. CONTRAST:  67mL OMNIPAQUE IOHEXOL 350 MG/ML SOLN COMPARISON:  None. FINDINGS: Cardiovascular: Large linear pulmonary embolus is noted in the distal right pulmonary artery which extends into the upper and lower lobe branches. RV/LV ratio of 1.4 is noted suggesting right heart strain. Normal cardiac size. Coronary artery calcifications are noted. No pericardial effusion is noted. Filling defect is also seen in upper lobe branch of the left pulmonary artery consistent with pulmonary embolus. Mediastinum/Nodes: No enlarged mediastinal, hilar, or axillary lymph nodes. Thyroid gland, trachea, and esophagus demonstrate no significant findings. Lungs/Pleura: No pneumothorax or pleural effusion is noted. 3.8 x 1.9 cm fluid-filled cavitary abnormality is noted posteriorly in the right lower lobe concerning for possible abscess or infected bulla. Upper Abdomen: 1.8 cm right adrenal nodule is noted. Musculoskeletal: Status post kyphoplasty at 2 lower thoracic levels. Review of the MIP images confirms the above findings. IMPRESSION: Large linear pulmonary embolus is noted in the distal right pulmonary artery which extends into the upper and lower lobe branches. Smaller embolus is seen in upper lobe branch of left pulmonary artery. Positive for acute PE with CT evidence of right heart strain (RV/LV Ratio = 1.4) consistent with at least submassive (intermediate risk) PE. The presence of right heart strain has been associated with an increased risk of morbidity and mortality. Please refer to the "PE Focused" order set in EPIC. 3.8 x 1.9 cm fluid-filled cavitary abnormality is noted posteriorly in the right lower lobe concerning for possible abscess or infected bulla. Critical Value/emergent results were  called by telephone at the time of interpretation on 11/12/2020 at 4:07 pm to provider [facility] , who verbally acknowledged these results. 1.8 cm right adrenal nodule is noted. Follow-up CT or MRI in 12 months is recommended to ensure stability. Electronically Signed   By: Marijo Conception M.D.   On: 11/12/2020 16:09   VAS [place] LOWER EXTREMITY VENOUS (DVT) (ONLY [facility] & [facility])  Result Date: 11/12/2020  Lower Venous DVT Study Patient Name:  Edgar Olson  Date of Exam:   11/12/2020 Medical Rec #: [id number]        Accession #:    [id number] Date of Birth: Dec 19, 1960        Patient Gender: M Patient Age:   59Y Exam Location:  [facility] Procedure:      VAS [place] LOWER EXTREMITY VENOUS (DVT) Referring Phys: [id number] JON KNAPP --------------------------------------------------------------------------------  Indications: Swelling, and Pain.  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 Anticoagulation: Eliquis. Limitations: Poor ultrasound/tissue interface. Performing Technologist: Rogelia Rohrer  RVT, RDMS  Examination Guidelines: A complete evaluation includes B-mode imaging, spectral Doppler, color Doppler, and power Doppler as needed of all accessible portions of each vessel. Bilateral testing is considered an integral part of a complete examination. Limited examinations for reoccurring indications may be performed as noted. The reflux portion of the exam is performed with the patient in reverse Trendelenburg.  +---------+---------------+---------+-----------+----------+-----------------+ RIGHT    CompressibilityPhasicitySpontaneityPropertiesThrombus Aging    +---------+---------------+---------+-----------+----------+-----------------+ CFV      Partial        Yes      Yes                  Age Indeterminate +---------+---------------+---------+-----------+----------+-----------------+ SFJ      Partial                                      Acute              +---------+---------------+---------+-----------+----------+-----------------+ FV Prox  None           No       No                   Acute             +---------+---------------+---------+-----------+----------+-----------------+ FV Mid   None           No       No                   Acute             +---------+---------------+---------+-----------+----------+-----------------+ FV DistalNone           No       No                   Acute             +---------+---------------+---------+-----------+----------+-----------------+ PFV      None           No       No                   Acute             +---------+---------------+---------+-----------+----------+-----------------+ POP      None           No       No                   Acute             +---------+---------------+---------+-----------+----------+-----------------+ PTV      None           No       No                   Acute             +---------+---------------+---------+-----------+----------+-----------------+ PERO     None           No       No                   Acute             +---------+---------------+---------+-----------+----------+-----------------+ Gastroc  None           No       No  Acute             +---------+---------------+---------+-----------+----------+-----------------+   +---------+---------------+---------+-----------+----------+--------------+ LEFT     CompressibilityPhasicitySpontaneityPropertiesThrombus Aging +---------+---------------+---------+-----------+----------+--------------+ CFV      Partial        Yes      Yes                  Acute          +---------+---------------+---------+-----------+----------+--------------+ SFJ      Partial                                      Acute          +---------+---------------+---------+-----------+----------+--------------+ FV Prox  Full           Yes      Yes                                  +---------+---------------+---------+-----------+----------+--------------+ FV Mid   None           No       No                   Acute          +---------+---------------+---------+-----------+----------+--------------+ FV DistalNone           No       No                   Acute          +---------+---------------+---------+-----------+----------+--------------+ PFV      Full           Yes      Yes                                 +---------+---------------+---------+-----------+----------+--------------+ POP      Partial        No       No                   Acute          +---------+---------------+---------+-----------+----------+--------------+ PTV      Full                                                        +---------+---------------+---------+-----------+----------+--------------+ PERO     Full                                                        +---------+---------------+---------+-----------+----------+--------------+     Summary: BILATERAL: -No evidence of popliteal cyst, bilaterally. RIGHT: - Findings consistent with acute deep vein thrombosis involving the SF junction, right femoral vein, right proximal profunda vein, right popliteal vein, right posterior tibial veins, right peroneal veins, and right gastrocnemius veins. - Findings consistent with age indeterminate deep vein thrombosis involving the right common femoral vein. - There is no evidence of superficial venous thrombosis.  LEFT: - Findings consistent with acute deep vein thrombosis involving the  left common femoral vein, SF junction, left femoral vein, and left popliteal vein.  *See table(s) above for measurements and observations. Electronically signed by Ruta Hinds MD on 11/12/2020 at 3:53:09 PM.    Final    VAS US AORTA/IVC/ILIACS  Result Date: 11/12/2020 IVC/ILIAC STUDY Patient Name:  Edgar Olson  Date of Exam:   11/12/2020 Medical Rec #: [id number]        Accession #:    [id number]  Date of Birth: 1961/03/19        Patient Gender: M Patient Age:   59Y Exam Location:  [facility] Procedure:      VAS US AORTA/IVC/ILIACS Referring Phys: [id number] JON KNAPP --------------------------------------------------------------------------------  Indications: DVT OF BLE with extension into iliac system. 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                Paitent states he has IV filter also. Medical records unavailable                [place]  Comparison Study: No previous exams in our system. Performing Technologist: Rogelia Rohrer RVT, RDMS  Examination Guidelines: A complete evaluation includes B-mode imaging, spectral Doppler, color Doppler, and power Doppler as needed of all accessible portions of each vessel. Bilateral testing is considered an integral part of a complete examination. Limited examinations for reoccurring indications may be performed as noted.  IVC/Iliac Findings: +----------+------+--------+------------------------------+    IVC    PatentThrombus           Comments            +----------+------+--------+------------------------------+ IVC Prox  patent                                       +----------+------+--------+------------------------------+ IVC Mid         chronic mixed thrombus - acute/chronic +----------+------+--------+------------------------------+ IVC Distal       acute                                 +----------+------+--------+------------------------------+  +-------------+---------+-----------+---------+-----------+--------------------+      CIV     RT-PatentRT-ThrombusLT-PatentLT-Thrombus      Comments       +-------------+---------+-----------+---------+-----------+--------------------+ Common Iliac            chronic              acute    RT mixed thrombus   Mid                                                     acute/chronic      +-------------+---------+-----------+---------+-----------+--------------------+  +--------------+---------+-----------+---------+-----------+-------------------+      EIV      RT-PatentRT-ThrombusLT-PatentLT-Thrombus     Comments       +--------------+---------+-----------+---------+-----------+-------------------+ External Iliac           chronic              acute   RT mixed thrombus - Vein Mid  acute/chronic    +--------------+---------+-----------+---------+-----------+-------------------+   Summary: IVC/Iliac: There is evidence of age indeterminate thrombus involving the IVC. There is evidence of age indeterminate thrombus involving the right common iliac vein. There is evidence of acute thrombus involving the left common iliac vein. There is evidence of age indeterminate thrombus involving the right external iliac vein. There is evidence of acute thrombus involving the left external iliac vein.  *See table(s) above for measurements and observations.  Electronically signed by Ruta Hinds MD on 11/12/2020 at 3:47:02 PM.    Final       EKG: Independently reviewed. EKG showed rate 84, normal sinus rhythm, LVH   Assessment/Plan Principal Problem:   Acute bilateral submassive pulmonary embolism (HCC), acute bilateral DVT lower extremity, acute thrombus in bilateral common iliac veins and IVC -Admit to progressive care unit at [facility], vascular surgery has been consulted, EDP discussed with Dr. Oneida Alar. -Continue IV heparin drip.  Per patient he did not miss any doses of Eliquis, may need hematology consult for extensive DVT/PE -Obtain 2D echo   Active Problems:  Abscess versus infected bulla right lower lobe noted on CT angiogram chest -CT angiogram chest showed 3.8x 1.9 cm fluid-filled cavitary abnormality in the posterior right lower lobe concerning for possible abscess and infected bulla -Will place on IV antibiotics,  obtain blood cultures, pulmonary consulted  - currently has no productive cough, fevers or chills or leukocytosis     Essential hypertension -Continue Norvasc 5 mg daily   Alcohol use -States he drinks 5-6 beers every few days, last drink was 3 days ago -Placed on CIWA protocol with Ativan as needed, thiamine, folate, MVI -Counseled on alcohol cessation  Hyponatremia  - no prior baseline, obtain serum osmolarity, urine osm, urine Na for work up  DVT prophylaxis: Currently on heparin drip  CODE STATUS: Full CODE STATUS  Consults called: pulmonology, EDP has called vascular surgery, Dr. Oneida Alar  Family Communication: Admission, patients condition and plan of care including tests being ordered have been discussed with the patient who indicates understanding and agree with the plan and Code Status  Admission status: Inpatient, progressive   The medical decision making on this patient was of high complexity and the patient is at high risk for clinical deterioration, therefore this is a level 3 admission.  Severity of Illness:      The appropriate patient status for this patient is INPATIENT. Inpatient status is judged to be reasonable and necessary in order to provide the required intensity of service to ensure the patient's safety. The patient's presenting symptoms, physical exam findings, and initial radiographic and laboratory data in the context of their chronic comorbidities is felt to place them at high risk for further clinical deterioration. Furthermore, it is not anticipated that the patient will be medically stable for discharge from the hospital within 2 midnights of admission. The following factors support the patient status of inpatient.   " The patient's presenting symptoms include chest pain with right lower leg swelling " The worrisome physical exam findings include right lower leg swelling " The initial radiographic and laboratory data are worrisome because of extensive  bilateral PE, bilateral DVT " The chronic co-morbidities include history of prior PE and DVT on anticoagulation, hypertension   * I certify that at the point of admission it is my clinical judgment that the patient will require inpatient hospital care spanning beyond 2 midnights from the point of admission due to high intensity of service, high risk for further deterioration and  high frequency of surveillance required.*   Time Spent on Admission: 70 minutes     Dawnetta Copenhaver M.D. [facility] 11/12/2020, 5:17 PM

## 2020-11-12 NOTE — Progress Notes (Signed)
Report received from [place], RN in ED on pt with [facility] included awaiting pt's arrival from [facility].

## 2020-11-12 NOTE — Consult Note (Addendum)
Referring Physician: [facility]  Patient name: Edgar Olson MRN: [id number] DOB: 1960/10/01 Sex: male  REASON FOR CONSULT: Occlusion of inferior vena cava filter  HPI: Hazem Kenner is a 60 y.o. 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  Most of his treatment was at [facility] in [place].  He states that at that time the never really found out why he clotted everything.  An IVC filter was placed.  He was placed on long-term anticoagulation.  He was originally on Xarelto.  He had no complications from this.  He was recently switched to Eliquis about 5 months ago.  He was traveling from [place] to [place] recently and developed swelling in both of his lower extremities.  He has a family history of his mother dying from some type of "blood clot".  He has no history of diabetes.  He does not know if he has a history of hypercoagulable state.  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  He states that the pathology did not show melanoma but he was concerned that it might be melanoma.  He states he has had a colonoscopy in the past which did not show any abnormalities.  Other medical problems include hypertension which has been stable.  He was a former smoker but quit about 4 5 years ago.  He drinks about a sixpack of beer per day.  He has chronic back pain and was treated for this recently with some type of physical therapy.  He does not recall any prior back operations.  He does not know the exact circumstances surrounding why an IVC filter was placed.  He does not recall have any pulmonary embolus in the past.  He has not had any bleeding complications.  He complains of swelling in both legs and some pain in the left inner thigh.  He does not have any numbness or tingling in his lower extremities.  He does not have any difficulty walking.  Past Medical History:   Diagnosis Date   DVT (deep venous thrombosis) (HCC)    Hypertension    History reviewed. No pertinent surgical history.  History reviewed. No pertinent family history.  SOCIAL HISTORY: Social History   Socioeconomic History   Marital status: Single    Spouse name: Not on file   Number of children: Not on file   Years of education: Not on file   Highest education level: Not on file  Occupational History   Not on file  Tobacco Use   Smoking status: Former    Pack years: 0.00    Types: Cigarettes    Quit date: 2018    Years since quitting: 4.5   Smokeless tobacco: Never  Vaping Use   Vaping Use: Never used  Substance and Sexual Activity   Alcohol use: Yes    Alcohol/week: 42.0 standard drinks    Types: 42 Cans of beer per week    Comment: 6 beers a night   Drug use: Yes    Frequency: 1.0 times per week    Types: Marijuana   Sexual activity: Not on file  Other Topics Concern   Not on file  Social History Narrative   Not on file   Social Determinants of Health   Financial Resource Strain: Not on file  Food Insecurity: Not on file  Transportation Needs: Not on file  Physical Activity:  Not on file  Stress: Not on file  Social Connections: Not on file  Intimate Partner Violence: Not on file    No Known Allergies  Current Facility-Administered Medications  Medication Dose Route Frequency Provider Last Rate Last Admin   heparin ADULT infusion 100 units/mL (25000 units/262mL)  1,700 Units/hr Intravenous Continuous Polly Cobia, RPH 17 mL/hr at 11/12/20 1656 1,700 Units/hr at 11/12/20 1656   Current Outpatient Medications  Medication Sig Dispense Refill   amLODipine (NORVASC) 5 MG tablet Take 5 mg by mouth daily.     apixaban (ELIQUIS) 5 MG TABS tablet Take 5 mg by mouth 2 (two) times daily.     cyclobenzaprine (FLEXERIL) 10 MG tablet Take 10 mg by mouth in the morning, at noon, and at bedtime.     gabapentin (NEURONTIN) 100 MG capsule Take 100 mg by mouth 3  (three) times daily.     oxyCODONE-acetaminophen (PERCOCET/ROXICET) 5-325 MG tablet Take 1 tablet by mouth every 6 (six) hours.      ROS:   General:  No weight loss, Fever, chills  HEENT: No recent headaches, no nasal bleeding, no visual changes, no sore throat  Neurologic: No dizziness, blackouts, seizures. No recent symptoms of stroke or mini- stroke. No recent episodes of slurred speech, or temporary blindness.  Cardiac: No recent episodes of chest pain/pressure, no shortness of breath at rest.  No shortness of breath with exertion.  Denies history of atrial fibrillation or irregular heartbeat  Vascular: No history of rest pain in feet.  No history of claudication.  No history of non-healing ulcer, No history of DVT   Pulmonary: No home oxygen, no productive cough, no hemoptysis,  No asthma or wheezing  Musculoskeletal:  [ ]  Arthritis, [ ]  Low back pain,  [ ]  Joint pain  Hematologic:No history of hypercoagulable state.  No history of easy bleeding.  No history of anemia  Gastrointestinal: No hematochezia or melena,  No gastroesophageal reflux, no trouble swallowing  Urinary: [ ]  chronic Kidney disease, [ ]  on HD - [ ]  MWF or [ ]  TTHS, [ ]  Burning with urination, [ ]  Frequent urination, [ ]  Difficulty urinating;   Skin: No rashes  Psychological: No history of anxiety,  No history of depression   Physical Examination  Vitals:   11/12/20 1415 11/12/20 1430 11/12/20 1515 11/12/20 1630  BP: (!) 160/109 (!) 165/99 (!) 154/100 (!) 154/105  Pulse: 89 87 92 92  Resp: 16 20 (!) 21 20  Temp:      TempSrc:      SpO2: 98% 98% 96% 99%  Weight:      Height:        Body mass index is 29.63 kg/m.  General:  Alert and oriented, no acute distress HEENT: Normal Neck: No JVD Pulmonary: Clear to auscultation bilaterally Cardiac: Regular Rate and Rhythm  Abdomen: Soft, non-tender, non-distended, no mass Skin: No rash, bluish congested appearance in the calf area  bilaterally Extremity Pulses:  2+ radial, brachial, femoral, absent popliteal dorsalis pedis, posterior tibial pulses bilaterally Musculoskeletal: No deformity or edema  Neurologic: Upper and lower extremity motor 5/5 and symmetric  DATA:  Lower extremity duplex exam showed acute DVT in the right common femoral superficial femoral profunda popliteal and tibial veins.  Common femoral vein did have some findings of chronic DVT.  Left leg also had similar findings of DVT with no chronic findings.  Caval iliac duplex showed age-indeterminate thrombus in the IVC and right common iliac vein.  There  was acute thrombus in the left common iliac vein with age-indeterminate thrombus in the right external iliac vein there was also acute thrombus in the left external iliac vein.  I reviewed and interpreted these images.  Plain abdominal film shows what appears to be a Cook select removable filter in the inferior vena cava.  I reviewed these images.  PE CT shows right distal pulmonary artery embolus with extension into upper and lower lobe branches smaller embolus in the left upper lobe branch.  There was some evidence of right heart strain.  There is also a 4 x 2 cm fluid collection in the posterior right lower lobe may represent an abscess.   CBC    Component Value Date/Time   WBC 7.5 11/12/2020 1340   RBC 4.53 11/12/2020 1340   HGB 13.7 11/12/2020 1340   HCT 41.3 11/12/2020 1340   PLT 205 11/12/2020 1340   MCV 91.2 11/12/2020 1340   MCH 30.2 11/12/2020 1340   MCHC 33.2 11/12/2020 1340   RDW 13.9 11/12/2020 1340    BMET    Component Value Date/Time   NA 129 (L) 11/12/2020 1340   K 4.5 11/12/2020 1340   CL 94 (L) 11/12/2020 1340   CO2 25 11/12/2020 1340   GLUCOSE 100 (H) 11/12/2020 1340   BUN 19 11/12/2020 1340   CREATININE 1.18 11/12/2020 1340   CALCIUM 9.3 11/12/2020 1340   GFRNONAA >60 11/12/2020 1340   INR 1.1   COVID test is pending   ASSESSMENT: 1.  Pulmonary embolus with some  evidence of right heart strain  2.  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  Possible right lung abscess   PLAN: 1.  Patient needs to be transferred to [facility]  2.  Patient needs IV heparin for now.  We will leave at discretion of primary service whether or not the pulmonary team needs to be involved for his pulmonary embolus  3.  Potential thrombolysis on 11/14/2020 with bilateral popliteal approach and potential angio VAC and possible removal of IVC filter on Friday, November 15, 2020.  4.  Need to follow-up on results of COVID testing.  However, prior to considering any removal of his IVC filter I think we need more information regarding what happened to him when he was in [place] at the time of his filter placement.  We need to make sure there is not a complete contraindication to removal of this.  Will follow as consult.   Ruta Hinds, MD [facility] Office: [phone]

## 2020-11-12 NOTE — Progress Notes (Addendum)
[place] for IV heparin Indication: New PE  No Known Allergies  Patient Measurements: Height: 6' 0.25" (183.5 cm) Weight: 99.8 kg (220 lb) IBW/kg (Calculated) : 78.18 Heparin Dosing Weight: ~TBW  Vital Signs: Temp: 98 F (36.7 C) (07/05 1154) Temp Source: Oral (07/05 1154) BP: 154/105 (07/05 1630) Pulse Rate: 92 (07/05 1630)  Labs: Recent Labs    11/12/20 1340 11/12/20 1711  HGB 13.7  --   HCT 41.3  --   PLT 205  --   APTT 36  --   LABPROT 14.2  --   INR 1.1  --   HEPARINUNFRC  --  >1.10*  CREATININE 1.18  --   TROPONINIHS  --  40*    Estimated Creatinine Clearance: 84.8 mL/min (by C-G formula based on SCr of 1.18 mg/dL).   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 Pharmacy to dose IV heparin.  Baseline aPTT WNL; HL elevated d/t recent Eliquis Prior anticoagulation: Eliquis 5 mg bid, last dose 7/4 PM  Significant events:  Today, 11/12/2020: CBC: WNL SCr WNL No bleeding or infusion issues per nursing Per Vascular Surgery, may need thrombolysis/mechanical thrombectomy  Goal of Therapy: Heparin level 0.3-0.7 units/ml Monitor platelets by anticoagulation protocol: Yes  Plan: Heparin 6000  units IV bolus x 1 Heparin 1700 units/hr IV infusion Check aPTT 6 hrs after start Daily CBC and heparin level; aPTT as needed while DOAC anti-Xa effects persist Monitor for signs of bleeding or thrombosis F/u Vascular surgery plans  Reuel Boom, PharmD, BCPS [phone] 11/12/2020, 8:14 PM

## 2020-11-13 ENCOUNTER — Inpatient Hospital Stay (HOSPITAL_COMMUNITY): Payer: Self-pay

## 2020-11-13 DIAGNOSIS — J948 Other specified pleural conditions: Secondary | ICD-10-CM

## 2020-11-13 DIAGNOSIS — I82403 Acute embolism and thrombosis of unspecified deep veins of lower extremity, bilateral: Secondary | ICD-10-CM

## 2020-11-13 DIAGNOSIS — J189 Pneumonia, unspecified organism: Secondary | ICD-10-CM

## 2020-11-13 DIAGNOSIS — I2609 Other pulmonary embolism with acute cor pulmonale: Secondary | ICD-10-CM

## 2020-11-13 DIAGNOSIS — I10 Essential (primary) hypertension: Secondary | ICD-10-CM

## 2020-11-13 DIAGNOSIS — E871 Hypo-osmolality and hyponatremia: Secondary | ICD-10-CM

## 2020-11-13 LAB — COMPREHENSIVE METABOLIC PANEL
ALT: 12 U/L (ref 0–44)
AST: 16 U/L (ref 15–41)
Albumin: 3.3 g/dL — ABNORMAL LOW (ref 3.5–5.0)
Alkaline Phosphatase: 47 U/L (ref 38–126)
Anion gap: 8 (ref 5–15)
BUN: 17 mg/dL (ref 6–20)
CO2: 24 mmol/L (ref 22–32)
Calcium: 9.3 mg/dL (ref 8.9–10.3)
Chloride: 96 mmol/L — ABNORMAL LOW (ref 98–111)
Creatinine, Ser: 1.11 mg/dL (ref 0.61–1.24)
GFR, Estimated: >60 mL/min (ref >60)
Glucose, Bld: 128 mg/dL — ABNORMAL HIGH (ref 70–99)
Potassium: 3.9 mmol/L (ref 3.5–5.1)
Sodium: 128 mmol/L — ABNORMAL LOW (ref 135–145)
Total Bilirubin: 1 mg/dL (ref 0.3–1.2)
Total Protein: 6.5 g/dL (ref 6.5–8.1)

## 2020-11-13 LAB — CBC
HCT: 38.7 % — ABNORMAL LOW (ref 39.0–52.0)
Hemoglobin: 13.1 g/dL (ref 13.0–17.0)
MCH: 30.3 pg (ref 26.0–34.0)
MCHC: 33.9 g/dL (ref 30.0–36.0)
MCV: 89.4 fL (ref 80.0–100.0)
Platelets: 217 10*3/uL (ref 150–400)
RBC: 4.33 MIL/uL (ref 4.22–5.81)
RDW: 13.7 % (ref 11.5–15.5)
WBC: 6.3 10*3/uL (ref 4.0–10.5)
nRBC: 0 % (ref 0.0–0.2)

## 2020-11-13 LAB — OSMOLALITY: Osmolality: 271 mOsm/kg — ABNORMAL LOW (ref 275–295)

## 2020-11-13 LAB — APTT
aPTT: 63 seconds — ABNORMAL HIGH (ref 24–36)
aPTT: 62 seconds — ABNORMAL HIGH (ref 24–36)
aPTT: 71 seconds — ABNORMAL HIGH (ref 24–36)

## 2020-11-13 LAB — ECHOCARDIOGRAM COMPLETE
Area-P 1/2: 2.5 cm2
Calc EF: 53.9 %
Height: 73 in
S' Lateral: 4.1 cm
Single Plane A2C EF: 54.1 %
Single Plane A4C EF: 54 %
Weight: 3817.6 oz

## 2020-11-13 LAB — OSMOLALITY, URINE: Osmolality, Ur: 653 mOsm/kg (ref 300–900)

## 2020-11-13 LAB — TSH: TSH: 3.154 u[IU]/mL (ref 0.350–4.500)

## 2020-11-13 LAB — HEPARIN LEVEL (UNFRACTIONATED): Heparin Unfractionated: >1.1 IU/mL — ABNORMAL HIGH (ref 0.30–0.70)

## 2020-11-13 LAB — HIV ANTIBODY (ROUTINE TESTING W REFLEX): HIV Screen 4th Generation wRfx: NONREACTIVE

## 2020-11-13 MED ORDER — IOHEXOL 350 MG/ML SOLN
100.0000 mL | Freq: Once | INTRAVENOUS | Status: AC | PRN
  Administered 2020-11-13: 100 mL via INTRAVENOUS

## 2020-11-13 NOTE — Progress Notes (Signed)
[place] for  heparin Indication:  PE  No Known Allergies  Patient Measurements: Height: 6\' 1"  (185.4 cm) Weight: 108.2 kg (238 lb 9.6 oz) IBW/kg (Calculated) : 79.9 Heparin Dosing Weight: ~TBW  Vital Signs: Temp: 98.3 F (36.8 C) (07/06 0453) Temp Source: Oral (07/06 0453) BP: 133/87 (07/06 1200) Pulse Rate: 100 (07/06 1200)  Labs: Recent Labs    11/12/20 1340 11/12/20 1711 11/12/20 2303 11/13/20 0338 11/13/20 1258  HGB 13.7  --   --  13.1  --   HCT 41.3  --   --  38.7*  --   PLT 205  --   --  217  --   APTT 36  --  75* 63* 62*  LABPROT 14.2  --   --   --   --   INR 1.1  --   --   --   --   HEPARINUNFRC  --  >1.10*  --  >1.10*  --   CREATININE 1.18  --   --  1.11  --   TROPONINIHS  --  40*  --   --   --      Estimated Creatinine Clearance: 94.7 mL/min (by C-G formula based on SCr of 1.11 mg/dL).  Assessment: 60 y.o. male with h/o VTE on Eliquis, now with new PE, for heparin.   aPTT came back still subtherapeutic at 62, on 1900  units/hr. No s/sx of bleeding or infusion issues.   Goal of Therapy: aPTT: 66-102 sec Heparin level 0.3-0.7 units/ml Monitor platelets by anticoagulation protocol: Yes  Plan: Increase heparin infusion to 2100 units/hr Order level in 6 hours Monitor CBC, aPTT/HL until correlate, and for s/sx of bleeding   Antonietta Jewel, PharmD, [place] Pharmacist  Phone: [phone] 11/13/2020 2:01 PM  Please check AMION for all [facility] phone numbers After 10:00 PM, call [place] [phone]

## 2020-11-13 NOTE — Progress Notes (Addendum)
[place] for  heparin Indication:  PE  No Known Allergies  Patient Measurements: Height: 6\' 1"  (185.4 cm) Weight: 108.2 kg (238 lb 9.6 oz) IBW/kg (Calculated) : 79.9 Heparin Dosing Weight: ~TBW  Vital Signs: Temp: 98.1 F (36.7 C) (07/05 2240) Temp Source: Oral (07/05 2240) BP: 154/100 (07/05 2240) Pulse Rate: 89 (07/05 2240)  Labs: Recent Labs    11/12/20 1340 11/12/20 1711 11/12/20 2303  HGB 13.7  --   --   HCT 41.3  --   --   PLT 205  --   --   APTT 36  --  75*  LABPROT 14.2  --   --   INR 1.1  --   --   HEPARINUNFRC  --  >1.10*  --   CREATININE 1.18  --   --   TROPONINIHS  --  40*  --      Estimated Creatinine Clearance: 89.1 mL/min (by C-G formula based on SCr of 1.18 mg/dL).  Assessment: 60 y.o. male with h/o VTE on Eliquis, now with new PE, for heparin  Goal of Therapy: Heparin level 0.3-0.7 units/ml Monitor platelets by anticoagulation protocol: Yes  Plan: Continue Heparin at current rate  Follow-up am labs.  Phillis Knack, PharmD, BCPS  11/13/2020, 12:14 AM  Addendum: aPTT this morning dropped to 63  Increase Heparin  1900 units/hr  Phillis Knack, PharmD, BCPS  11/13/2020 5:24 AM

## 2020-11-13 NOTE — Plan of Care (Signed)
  Problem: Education: Goal: Knowledge of General Education information will improve Description: Including pain rating scale, medication(s)/side effects and non-pharmacologic comfort measures Outcome: Progressing   Problem: Health Behavior/Discharge Planning: Goal: Ability to manage health-related needs will improve Outcome: Progressing   Problem: Clinical Measurements: Goal: Ability to maintain clinical measurements within normal limits will improve Outcome: Progressing Goal: Will remain free from infection Outcome: Progressing Goal: Diagnostic test results will improve Outcome: Progressing Goal: Respiratory complications will improve Outcome: Progressing Goal: Cardiovascular complication will be avoided Outcome: Progressing   Problem: Nutrition: Goal: Adequate nutrition will be maintained Outcome: Progressing   Problem: Activity: Goal: Risk for activity intolerance will decrease Outcome: Progressing   Problem: Coping: Goal: Level of anxiety will decrease Outcome: Progressing   Problem: Pain Managment: Goal: General experience of comfort will improve Outcome: Progressing   Problem: Safety: Goal: Ability to remain free from injury will improve Outcome: Progressing   Problem: Skin Integrity: Goal: Risk for impaired skin integrity will decrease Outcome: Progressing

## 2020-11-13 NOTE — Progress Notes (Signed)
  Echocardiogram 2D Echocardiogram has been performed.  Michiel Cowboy 11/13/2020, 3:18 PM

## 2020-11-13 NOTE — Consult Note (Signed)
NAME:  Estell Dillinger, MRN:  [id number], DOB:  Feb 17, 1961, LOS: 1 ADMISSION DATE:  11/12/2020, CONSULTATION DATE:  11/13/20 REFERRING MD: Hildred Laser CHIEF COMPLAINT: Chest pain  History of Present Illness:  60 year old with history of recurrent blood clots who was admitted with submassive PE and incidental discovery of right lower lobe cavitary lesion for which we are consulted.  H&P reviewed.  Vascular surgery consult note reviewed.  Patient in the ED with leg swelling.  Similar to prior presentation with clots.  I received filter placed 2 years ago.  Has been on Eliquis since.  Recently moved to the area.  Flying back and forth.  In the hospital for couple days 6/20 through 7/2 with low back pain.  Drove to [place] the next day.  Right leg was more swollen, up to the thigh.  Left leg looked a bit more swollen but not as bad.  Had chest pain.  Came to the ED.  CTA PE protocol with right greater than left clot, scattered bullae or cystic lung lesions primarily on the right with a large right lower lobe bullae with air-fluid level.  Procalcitonin negative.  Labs notable for hyponatremia.  Creatinine 1.18.  Troponin mildly elevated at 40.  CBC unremarkable.  Denies any cough.  Occasional brief sweats during the day.  Homeless for up to a year at a time.  4 days in jail.  He denies any risky sexual behavior.  No IV drug use.  Used to smoke cigarettes.  At least 2 packs a day.  Up to 40 years.  Sometimes more.  Smoke marijuana as well.  Quit cigarette smoking a few years ago with the PE.  Denies smoking other illicit substances.  Pertinent  Medical History  Recurrent blood clots  Significant Hospital Events: Including procedures, antibiotic start and stop dates in addition to other pertinent events   Admitted, CTA with PE, submassive, concern for thrombosed IVC filter  Interim History / Subjective:  Denies chest pain, fever, cough, sputum, hemoptysis.  Leg pain Rt > Lt.  Objective   Blood pressure  134/88, pulse 99, temperature 98.3 F (36.8 C), temperature source Oral, resp. rate 16, height 6\' 1"  (1.854 m), weight 108.2 kg, SpO2 96 %.        Intake/Output Summary (Last 24 hours) at 11/13/2020 0849 Last data filed at 11/13/2020 0420 Gross per 24 hour  Intake 852.54 ml  Output 300 ml  Net 552.54 ml   Filed Weights   11/12/20 1151 11/12/20 2240  Weight: 99.8 kg 108.2 kg    Examination:  General - alert Eyes - pupils reactive ENT - no sinus tenderness, no stridor Cardiac - regular rate/rhythm, no murmur Chest - equal breath sounds b/l, no wheezing or rales Abdomen - soft, non tender, + bowel sounds Extremities - 1+ non pitting edema Skin - no rashes Neuro - normal strength, moves extremities, follows commands Psych - normal mood and behavior  CT angio chest 11/12/20 >> PE in distal Rt PA and Lt upper lobe branch PA with RV:LV 1.4, coronary calcifications, 3.8 x 1.9 cm fluid filled cavitary lesion RLL, 1.8 cm Rt adrenal nodule  Resolved Hospital Problem list     Assessment & Plan:   Cavitary lung lesion Rt lower lobe. - most likely infected bulla (he reports being told about a hole in his lung several years ago) - would continue antibiotics for total of 3 weeks - will arrange for outpt follow up and arrange for follow up imaging studies at that  time  Acute b/l PE and acute b/l DVT. Occluded IVC filter. - remains on heparin gtt - vascular surgery to arrange for thrombolysis followed by angiovac later this week - Echo pending  Hyponatremia. - per primary team  He has been scheduled for follow up appointment with Dr. Silas Flood on August 9 at 9:30 am.  PCCM will sign off.  Please call if there are additional questions while he is in hospital.   Labs    CMP Latest Ref Rng & Units 11/13/2020 11/12/2020  Glucose 70 - 99 mg/dL 128(H) 100(H)  BUN 6 - 20 mg/dL 17 19  Creatinine 0.61 - 1.24 mg/dL 1.11 1.18  Sodium 135 - 145 mmol/L 128(L) 129(L)  Potassium 3.5 - 5.1 mmol/L  3.9 4.5  Chloride 98 - 111 mmol/L 96(L) 94(L)  CO2 22 - 32 mmol/L 24 25  Calcium 8.9 - 10.3 mg/dL 9.3 9.3  Total Protein 6.5 - 8.1 g/dL 6.5 -  Total Bilirubin 0.3 - 1.2 mg/dL 1.0 -  Alkaline Phos 38 - 126 U/L 47 -  AST 15 - 41 U/L 16 -  ALT 0 - 44 U/L 12 -    CBC Latest Ref Rng & Units 11/13/2020 11/12/2020  WBC 4.0 - 10.5 K/uL 6.3 7.5  Hemoglobin 13.0 - 17.0 g/dL 13.1 13.7  Hematocrit 39.0 - 52.0 % 38.7(L) 41.3  Platelets 150 - 400 K/uL 217 205    Signature:  Chesley Mires, MD [place] Pager - [phone] 11/13/2020, 8:54 AM

## 2020-11-13 NOTE — Progress Notes (Signed)
PROGRESS NOTE        PATIENT DETAILS Name: Edgar Olson Age: 60 y.o. Sex: male Date of Birth: Apr 28, 1961 Admit Date: 11/12/2020 Admitting Physician Ripudeep Krystal Eaton, MD PCP:Pcp, No  Brief Narrative: Patient is a 60 y.o. 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  Started on anticoagulation, antibiotics-and subsequently admitted to the hospitalist service.  See below for further details.  Significant events: 7/5>> presented [facility]-with bilateral leg pain-found to have DVT/PE/cavitary lung lesion-transferred to [facility]   Significant studies: 7/5>> bilateral lower extremity Doppler: Extensive bilateral DVT 7/5>> Doppler/US aorta/IVC/iliacs: Age indeterminate thrombus involving IVC, right common iliac vein, acute thrombus involving left iliac vein. 7/5>> CTA chest: PE in the distal right pulmonary artery, CT evidence of RV strain, 3.8 x 1.9 cm fluid-filled cavitary lesion in the right lower lobe.  Antimicrobial therapy: None  Microbiology data: 7/5>> blood culture: No growth 7/5>> COVID/influenza PCR: Negative  Procedures : None  Consults: Vascular surgery, pulmonology  DVT Prophylaxis : IV heparin   Subjective: Lying comfortably in bed-continues to have some swelling in lower extremities.   Assessment/Plan: Bilateral DVT/PE with occluded IVC filter: Continue IV heparin-no clinical signs of RV strain (although has CT evidence).  Vascular surgery following with plans to pursue thrombolysis on Thursday, and angio vac on Friday.  Likely will need indefinite anticoagulation given this is his second episode.  Right lower lobe cavitary lesion: Per PCCM-not suspicious for TB-recommendations are to continue Augmentin x3 weeks.  PCCM will arrange for outpatient follow-up.  HTN: BP stable-continue  amlodipine.  1.8 cm right adrenal nodule: Radiology recommends CT/MRI in 12 months.  EtOH use: No signs of withdrawal-apparently drinks 5-6 beers every few days.  Watch for signs of withdrawal-on Ativan per CIWA protocol.  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  Asymptomatic-mild-stable for watchful monitoring.  Obesity: Estimated body mass index is 31.48 kg/m as calculated from the following:   Height as of this encounter: 6\' 1"  (1.854 m).   Weight as of this encounter: 108.2 kg.    Diet: Diet Order             Diet NPO time specified  Diet effective midnight           Diet Heart Room service appropriate? Yes; Fluid consistency: Thin  Diet effective now                    Code Status: Full code   Family Communication: None at bedside  Disposition Plan: Status is: Inpatient  Remains inpatient appropriate because:IV treatments appropriate due to intensity of illness or inability to take PO  Dispo: The patient is from: Home              Anticipated d/c is to: Home              Patient currently is not medically stable to d/c.   Difficult to place patient No   Barriers to Discharge: Extensive bilateral lower extremity DVT-pulmonary embolism-on IV heparin-vascular surgery planning thrombolysis and angio vac later this week.  Antimicrobial agents: Anti-infectives (From admission, onward)    Start     Dose/Rate Route Frequency Ordered Stop   11/12/20 2000  amoxicillin-clavulanate (  AUGMENTIN) 875-125 MG per tablet 1 tablet        1 tablet Oral Every 12 hours 11/12/20 1909          Time spent: 35 minutes-Greater than 50% of this time was spent in counseling, explanation of diagnosis, planning of further management, and coordination of care.  MEDICATIONS: Scheduled Meds:  amLODipine  5 mg Oral Daily   amoxicillin-clavulanate  1 tablet Oral Q12H   cyclobenzaprine  10 mg Oral TID    folic acid  1 mg Oral Daily   gabapentin  100 mg Oral TID   multivitamin with minerals  1 tablet Oral Daily   thiamine  100 mg Oral Daily   Or   thiamine  100 mg Intravenous Daily   Continuous Infusions:  heparin 1,900 Units/hr (11/13/20 0619)   PRN Meds:.acetaminophen **OR** acetaminophen, HYDROmorphone (DILAUDID) injection, LORazepam **OR** LORazepam, polyethylene glycol   PHYSICAL EXAM: Vital signs: Vitals:   11/12/20 2000 11/12/20 2240 11/13/20 0453 11/13/20 1200  BP: (!) 140/99 (!) 154/100 134/88 133/87  Pulse: 84 89 99 100  Resp: 20 15 16 16   Temp:  98.1 F (36.7 C) 98.3 F (36.8 C)   TempSrc:  Oral Oral   SpO2: 98% 97% 96% 98%  Weight:  108.2 kg    Height:  6\' 1"  (1.854 m)     Filed Weights   11/12/20 1151 11/12/20 2240  Weight: 99.8 kg 108.2 kg   Body mass index is 31.48 kg/m.   Gen Exam:Alert awake-not in any distress HEENT:atraumatic, normocephalic Chest: B/L clear to auscultation anteriorly CVS:S1S2 regular Abdomen:soft non tender, non distended Extremities: Right more than left lower extremity swelling. Neurology: Non focal Skin: no rash  I have personally reviewed following labs and imaging studies  LABORATORY DATA: CBC: Recent Labs  Lab 11/12/20 1340 11/13/20 0338  WBC 7.5 6.3  HGB 13.7 13.1  HCT 41.3 38.7*  MCV 91.2 89.4  PLT 205 703    Basic Metabolic Panel: Recent Labs  Lab 11/12/20 1340 11/12/20 1839 11/13/20 0338  NA 129*  --  128*  K 4.5  --  3.9  CL 94*  --  96*  CO2 25  --  24  GLUCOSE 100*  --  128*  BUN 19  --  17  CREATININE 1.18  --  1.11  CALCIUM 9.3  --  9.3  MG  --  2.2  --   PHOS  --  3.3  --     GFR: Estimated Creatinine Clearance: 94.7 mL/min (by C-G formula based on SCr of 1.11 mg/dL).  Liver Function Tests: Recent Labs  Lab 11/13/20 0338  AST 16  ALT 12  ALKPHOS 47  BILITOT 1.0  PROT 6.5  ALBUMIN 3.3*   No results for input(s): LIPASE, AMYLASE in the last 168 hours. No results for  input(s): AMMONIA in the last 168 hours.  Coagulation Profile: Recent Labs  Lab 11/12/20 1340  INR 1.1    Cardiac Enzymes: No results for input(s): CKTOTAL, CKMB, CKMBINDEX, TROPONINI in the last 168 hours.  BNP (last 3 results) No results for input(s): PROBNP in the last 8760 hours.  Lipid Profile: No results for input(s): CHOL, HDL, LDLCALC, TRIG, CHOLHDL, LDLDIRECT in the last 72 hours.  Thyroid Function Tests: No results for input(s): TSH, T4TOTAL, FREET4, T3FREE, THYROIDAB in the last 72 hours.  Anemia Panel: No results for input(s): VITAMINB12, FOLATE, FERRITIN, TIBC, IRON, RETICCTPCT in the last 72 hours.  Urine analysis: No results found for: COLORURINE,  APPEARANCEUR, LABSPEC, PHURINE, GLUCOSEU, HGBUR, BILIRUBINUR, KETONESUR, PROTEINUR, UROBILINOGEN, NITRITE, LEUKOCYTESUR  Sepsis Labs: Lactic Acid, Venous No results found for: LATICACIDVEN  MICROBIOLOGY: Recent Results (from the past 240 hour(s))  Resp Panel by RT-PCR (Flu A&B, Covid) Nasopharyngeal Swab     Status: None   Collection Time: 11/12/20  5:11 PM   Specimen: Nasopharyngeal Swab; Nasopharyngeal(NP) swabs in vial transport medium  Result Value Ref Range Status   SARS Coronavirus 2 by RT PCR NEGATIVE NEGATIVE Final    Comment: (NOTE) SARS-CoV-2 target nucleic acids are NOT DETECTED.  The SARS-CoV-2 RNA is generally detectable in upper respiratory specimens during the acute phase of infection. The lowest concentration of SARS-CoV-2 viral copies this assay can detect is 138 copies/mL. A negative result does not preclude SARS-Cov-2 infection and should not be used as the sole basis for treatment or other patient management decisions. A negative result may occur with  improper specimen collection/handling, submission of specimen other than nasopharyngeal swab, presence of viral mutation(s) within the areas targeted by this assay, and inadequate number of viral copies(<138 copies/mL). A negative result  must be combined with clinical observations, patient history, and epidemiological information. The expected result is Negative.  Fact Sheet for Patients:  EntrepreneurPulse.com.au  Fact Sheet for Healthcare Providers:  IncredibleEmployment.be  This test is no t yet approved or cleared by the [place] FDA and  has been authorized for detection and/or diagnosis of SARS-CoV-2 by FDA under an Emergency Use Authorization (EUA). This EUA will remain  in effect (meaning this test can be used) for the duration of the COVID-19 declaration under Section 564(b)(1) of the Act, 21 U.S.C.section 360bbb-3(b)(1), unless the authorization is terminated  or revoked sooner.       Influenza A by PCR NEGATIVE NEGATIVE Final   Influenza B by PCR NEGATIVE NEGATIVE Final    Comment: (NOTE) The Xpert Xpress SARS-CoV-2/FLU/RSV plus assay is intended as an aid in the diagnosis of influenza from Nasopharyngeal swab specimens and should not be used as a sole basis for treatment. Nasal washings and aspirates are unacceptable for Xpert Xpress SARS-CoV-2/FLU/RSV testing.  Fact Sheet for Patients: EntrepreneurPulse.com.au  Fact Sheet for Healthcare Providers: IncredibleEmployment.be  This test is not yet approved or cleared by the [place] FDA and has been authorized for detection and/or diagnosis of SARS-CoV-2 by FDA under an Emergency Use Authorization (EUA). This EUA will remain in effect (meaning this test can be used) for the duration of the COVID-19 declaration under Section 564(b)(1) of the Act, 21 U.S.C. section 360bbb-3(b)(1), unless the authorization is terminated or revoked.  Performed at [facility], [place] [address], [place], [place] [address]   Culture, blood (Routine X 2) w Reflex to ID Panel     Status: None (Preliminary result)   Collection Time: 11/12/20  6:25 PM   Specimen: BLOOD   Result Value Ref Range Status   Specimen Description   Final    BLOOD SITE NOT SPECIFIED Performed at [facility] [address] [place], [place] [address]    Special Requests   Final    BOTTLES DRAWN AEROBIC ONLY Blood Culture adequate volume Performed at [facility] [address], [place], [place] [address]    Culture   Final    NO GROWTH < 12 HOURS Performed at [facility] [address], [place], [place] [address]    Report Status PENDING  Incomplete  Culture, blood (Routine X 2) w Reflex to ID  Panel     Status: None (Preliminary result)   Collection Time: 11/12/20  6:39 PM   Specimen: BLOOD  Result Value Ref Range Status   Specimen Description   Final    BLOOD RIGHT ANTECUBITAL Performed at [facility] [address], [place], [place] [address]    Special Requests   Final    BOTTLES DRAWN AEROBIC AND ANAEROBIC Blood Culture adequate volume Performed at [facility] [address] [place], [place] [address]    Culture   Final    NO GROWTH < 12 HOURS Performed at [facility] [address], [place], [place] [address]    Report Status PENDING  Incomplete    RADIOLOGY STUDIES/RESULTS: DG Abd 1 View  Result Date: 11/12/2020 CLINICAL DATA:  Encounter for IVC location. Patient reports IVC filter was placed 2 years ago. EXAM: ABDOMEN - 1 VIEW COMPARISON:  None. FINDINGS: IVC filter is seen to the right of L2-L3. normal visualized bowel gas pattern. Excreted IV contrast within both renal collecting systems from prior CT. Kyphoplasty within lower thoracic vertebra, partially included. IMPRESSION: IVC filter to the right of L2-L3. Electronically Signed   By: Keith Rake M.D.   On: 11/12/2020 16:10   CT Angio Chest PE W and/or Wo Contrast  Result Date: 11/12/2020 CLINICAL DATA:  Bilateral lower extremity swelling. EXAM: CT ANGIOGRAPHY CHEST WITH CONTRAST TECHNIQUE: Multidetector  CT imaging of the chest was performed using the standard protocol during bolus administration of intravenous contrast. Multiplanar CT image reconstructions and MIPs were obtained to evaluate the vascular anatomy. CONTRAST:  66mL OMNIPAQUE IOHEXOL 350 MG/ML SOLN COMPARISON:  None. FINDINGS: Cardiovascular: Large linear pulmonary embolus is noted in the distal right pulmonary artery which extends into the upper and lower lobe branches. RV/LV ratio of 1.4 is noted suggesting right heart strain. Normal cardiac size. Coronary artery calcifications are noted. No pericardial effusion is noted. Filling defect is also seen in upper lobe branch of the left pulmonary artery consistent with pulmonary embolus. Mediastinum/Nodes: No enlarged mediastinal, hilar, or axillary lymph nodes. Thyroid gland, trachea, and esophagus demonstrate no significant findings. Lungs/Pleura: No pneumothorax or pleural effusion is noted. 3.8 x 1.9 cm fluid-filled cavitary abnormality is noted posteriorly in the right lower lobe concerning for possible abscess or infected bulla. Upper Abdomen: 1.8 cm right adrenal nodule is noted. Musculoskeletal: Status post kyphoplasty at 2 lower thoracic levels. Review of the MIP images confirms the above findings. IMPRESSION: Large linear pulmonary embolus is noted in the distal right pulmonary artery which extends into the upper and lower lobe branches. Smaller embolus is seen in upper lobe branch of left pulmonary artery. Positive for acute PE with CT evidence of right heart strain (RV/LV Ratio = 1.4) consistent with at least submassive (intermediate risk) PE. The presence of right heart strain has been associated with an increased risk of morbidity and mortality. Please refer to the "PE Focused" order set in EPIC. 3.8 x 1.9 cm fluid-filled cavitary abnormality is noted posteriorly in the right lower lobe concerning for possible abscess or infected bulla. Critical Value/emergent results were called by telephone  at the time of interpretation on 11/12/2020 at 4:07 pm to provider [facility] , who verbally acknowledged these results. 1.8 cm right adrenal nodule is noted. Follow-up CT or MRI in 12 months is recommended to ensure stability. Electronically Signed   By: Marijo Conception M.D.   On: 11/12/2020 16:09   VAS [place] LOWER EXTREMITY VENOUS (DVT) (  ONLY [facility] & [facility])  Result Date: 11/12/2020  Lower Venous DVT Study Patient Name:  Edgar Olson  Date of Exam:   11/12/2020 Medical Rec #: [id number]        Accession #:    [id number] Date of Birth: Sep 03, 1960        Patient Gender: M Patient Age:   59Y Exam Location:  [facility] Procedure:      VAS [place] LOWER EXTREMITY VENOUS (DVT) Referring Phys: [id number] JON KNAPP --------------------------------------------------------------------------------  Indications: Swelling, and Pain.  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 Anticoagulation: Eliquis. Limitations: Poor ultrasound/tissue interface. Performing Technologist: Rogelia Rohrer RVT, RDMS  Examination Guidelines: A complete evaluation includes B-mode imaging, spectral Doppler, color Doppler, and power Doppler as needed of all accessible portions of each vessel. Bilateral testing is considered an integral part of a complete examination. Limited examinations for reoccurring indications may be performed as noted. The reflux portion of the exam is performed with the patient in reverse Trendelenburg.  +---------+---------------+---------+-----------+----------+-----------------+ RIGHT    CompressibilityPhasicitySpontaneityPropertiesThrombus Aging    +---------+---------------+---------+-----------+----------+-----------------+ CFV      Partial        Yes      Yes                  Age Indeterminate +---------+---------------+---------+-----------+----------+-----------------+ SFJ      Partial                                      Acute              +---------+---------------+---------+-----------+----------+-----------------+ FV Prox  None           No       No                   Acute             +---------+---------------+---------+-----------+----------+-----------------+ FV Mid   None           No       No                   Acute             +---------+---------------+---------+-----------+----------+-----------------+ FV DistalNone           No       No                   Acute             +---------+---------------+---------+-----------+----------+-----------------+ PFV      None           No       No                   Acute             +---------+---------------+---------+-----------+----------+-----------------+ POP      None           No       No                   Acute             +---------+---------------+---------+-----------+----------+-----------------+ PTV      None           No       No  Acute             +---------+---------------+---------+-----------+----------+-----------------+ PERO     None           No       No                   Acute             +---------+---------------+---------+-----------+----------+-----------------+ Gastroc  None           No       No                   Acute             +---------+---------------+---------+-----------+----------+-----------------+   +---------+---------------+---------+-----------+----------+--------------+ LEFT     CompressibilityPhasicitySpontaneityPropertiesThrombus Aging +---------+---------------+---------+-----------+----------+--------------+ CFV      Partial        Yes      Yes                  Acute          +---------+---------------+---------+-----------+----------+--------------+ SFJ      Partial                                      Acute          +---------+---------------+---------+-----------+----------+--------------+ FV Prox  Full           Yes      Yes                                  +---------+---------------+---------+-----------+----------+--------------+ FV Mid   None           No       No                   Acute          +---------+---------------+---------+-----------+----------+--------------+ FV DistalNone           No       No                   Acute          +---------+---------------+---------+-----------+----------+--------------+ PFV      Full           Yes      Yes                                 +---------+---------------+---------+-----------+----------+--------------+ POP      Partial        No       No                   Acute          +---------+---------------+---------+-----------+----------+--------------+ PTV      Full                                                        +---------+---------------+---------+-----------+----------+--------------+ PERO     Full                                                        +---------+---------------+---------+-----------+----------+--------------+  Summary: BILATERAL: -No evidence of popliteal cyst, bilaterally. RIGHT: - Findings consistent with acute deep vein thrombosis involving the SF junction, right femoral vein, right proximal profunda vein, right popliteal vein, right posterior tibial veins, right peroneal veins, and right gastrocnemius veins. - Findings consistent with age indeterminate deep vein thrombosis involving the right common femoral vein. - There is no evidence of superficial venous thrombosis.  LEFT: - Findings consistent with acute deep vein thrombosis involving the left common femoral vein, SF junction, left femoral vein, and left popliteal vein.  *See table(s) above for measurements and observations. Electronically signed by Ruta Hinds MD on 11/12/2020 at 3:53:09 PM.    Final    VAS US AORTA/IVC/ILIACS  Result Date: 11/12/2020 IVC/ILIAC STUDY Patient Name:  Edgar Olson  Date of Exam:   11/12/2020 Medical Rec #: [id number]        Accession #:    [id number]  Date of Birth: 02-May-1961        Patient Gender: M Patient Age:   59Y Exam Location:  [facility] Procedure:      VAS US AORTA/IVC/ILIACS Referring Phys: [id number] JON KNAPP --------------------------------------------------------------------------------  Indications: DVT OF BLE with extension into iliac system. 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                Paitent states he has IV filter also. Medical records unavailable                ([place] out of state).  Comparison Study: No previous exams in our system. Performing Technologist: Rogelia Rohrer RVT, RDMS  Examination Guidelines: A complete evaluation includes B-mode imaging, spectral Doppler, color Doppler, and power Doppler as needed of all accessible portions of each vessel. Bilateral testing is considered an integral part of a complete examination. Limited examinations for reoccurring indications may be performed as noted.  IVC/Iliac Findings: +----------+------+--------+------------------------------+    IVC    PatentThrombus           Comments            +----------+------+--------+------------------------------+ IVC Prox  patent                                       +----------+------+--------+------------------------------+ IVC Mid         chronic mixed thrombus - acute/chronic +----------+------+--------+------------------------------+ IVC Distal       acute                                 +----------+------+--------+------------------------------+  +-------------+---------+-----------+---------+-----------+--------------------+      CIV     RT-PatentRT-ThrombusLT-PatentLT-Thrombus      Comments       +-------------+---------+-----------+---------+-----------+--------------------+ Common Iliac            chronic              acute    RT mixed thrombus   Mid                                                     acute/chronic      +-------------+---------+-----------+---------+-----------+--------------------+  +--------------+---------+-----------+---------+-----------+-------------------+      EIV      RT-PatentRT-ThrombusLT-PatentLT-Thrombus  Comments       +--------------+---------+-----------+---------+-----------+-------------------+ External Iliac           chronic              acute   RT mixed thrombus - Vein Mid                                                 acute/chronic    +--------------+---------+-----------+---------+-----------+-------------------+   Summary: IVC/Iliac: There is evidence of age indeterminate thrombus involving the IVC. There is evidence of age indeterminate thrombus involving the right common iliac vein. There is evidence of acute thrombus involving the left common iliac vein. There is evidence of age indeterminate thrombus involving the right external iliac vein. There is evidence of acute thrombus involving the left external iliac vein.  *See table(s) above for measurements and observations.  Electronically signed by Ruta Hinds MD on 11/12/2020 at 3:47:02 PM.    Final      LOS: 1 day   Oren Binet, MD  [facility]    To contact the attending provider between 7A-7P or the covering provider during after hours 7P-7A, please log into the web site [email or web address] and access using universal [place] password for that web site. If you do not have the password, please call the hospital operator.  11/13/2020, 12:29 PM

## 2020-11-13 NOTE — Progress Notes (Signed)
Pt's aPTT is 71 and therapeutic no change at this time.

## 2020-11-13 NOTE — Progress Notes (Signed)
CSW received consult for substance use resources for patient. CSW met with patient at bedside. CSW offered patient outpatient substance use treatment services resources. Patient declined. 

## 2020-11-13 NOTE — Progress Notes (Addendum)
  Progress Note    11/13/2020 7:49 AM * No surgery found *  Subjective:  No pain in BLE when legs are elevated in bed   Vitals:   11/12/20 2240 11/13/20 0453  BP: (!) 154/100 134/88  Pulse: 89 99  Resp: 15 16  Temp: 98.1 F (36.7 C) 98.3 F (36.8 C)  SpO2: 97% 96%   Physical Exam: Lungs:  non labored Extremities:  feet warm and well perfused with motor and function intact; Calves are soft Abdomen:  soft, NT, ND Neurologic: A&O  CBC    Component Value Date/Time   WBC 6.3 11/13/2020 0338   RBC 4.33 11/13/2020 0338   HGB 13.1 11/13/2020 0338   HCT 38.7 (L) 11/13/2020 0338   PLT 217 11/13/2020 0338   MCV 89.4 11/13/2020 0338   MCH 30.3 11/13/2020 0338   MCHC 33.9 11/13/2020 0338   RDW 13.7 11/13/2020 0338    BMET    Component Value Date/Time   NA 128 (L) 11/13/2020 0338   K 3.9 11/13/2020 0338   CL 96 (L) 11/13/2020 0338   CO2 24 11/13/2020 0338   GLUCOSE 128 (H) 11/13/2020 0338   BUN 17 11/13/2020 0338   CREATININE 1.11 11/13/2020 0338   CALCIUM 9.3 11/13/2020 0338   GFRNONAA >60 11/13/2020 0338    INR    Component Value Date/Time   INR 1.1 11/12/2020 1340    No intake or output data in the 24 hours ending 11/13/20 0749   Assessment/Plan:  60 y.o. male is s/p occluded IVC filter which was placed in [place] about 18 months ago  Feet warm and well perfused without sign of compartment syndrome of BLE Continue leg elevation above the heart and IV heparin Potential thrombolysis on 11/14/2020 with bilateral popliteal approach and potential angio VAC and possible removal of IVC filter on Friday, November 15, 2020   Dagoberto Ligas, PA-C [facility] [phone] 11/13/2020 7:49 AM  Agree with above swelling about the same.  Will continue to try to obtain records from [place] regarding indications and circumstances surrounding prior thrombotic event.  Continue heparin even on call to cath lab tomorrow  Thrombolysis Thursday followed by  AngioVac Friday.  Procedure risk benefits complications discussed including but not limited to bleeding including intracranial infection injury to IVC d/w pt who wisheds to proceed.   Ruta Hinds, MD [facility] [place] Office: [phone]

## 2020-11-13 NOTE — Progress Notes (Signed)
I ordered a CT venogram to help with case planning.   Annamarie Major

## 2020-11-13 NOTE — Progress Notes (Signed)
[facility] for  IV [facility] Indication:  PE  No Known Allergies  Patient Measurements: Height: 6\' 1"  (185.4 cm) Weight: 108.2 kg (238 lb 9.6 oz) IBW/kg (Calculated) : 79.9 Heparin Dosing Weight: 102.4 kg  Vital Signs: BP: 133/87 (07/06 1200) Pulse Rate: 79 (07/06 1800)  Labs: Recent Labs    11/12/20 1340 11/12/20 1711 11/12/20 2303 11/13/20 0338 11/13/20 1258 11/13/20 2047  HGB 13.7  --   --  13.1  --   --   HCT 41.3  --   --  38.7*  --   --   PLT 205  --   --  217  --   --   APTT 36  --    < > 63* 62* 71*  LABPROT 14.2  --   --   --   --   --   INR 1.1  --   --   --   --   --   HEPARINUNFRC  --  >1.10*  --  >1.10*  --   --   CREATININE 1.18  --   --  1.11  --   --   TROPONINIHS  --  40*  --   --   --   --    < > = values in this interval not displayed.    Estimated Creatinine Clearance: 94.7 mL/min (by C-G formula based on SCr of 1.11 mg/dL).  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 Pharmacy was consulted to dose IV heparin. Given recent apixaban exposure, will monitor anticoagulation using aPTT until aPTT and heparin levels correlate.  aPTT ~6.5 hrs after heparin infusion was increased to 2100 units/hr was 71 sec, which is within the goal range for this pt. H/H 13.1/38.7, plt 217. Per RN, no issues with IV or bleeding observed.  VVS planning thrombolysis on Thursday and angiovac on Friday.  Goal of Therapy: aPTT: 66-102 sec Heparin level 0.3-0.7 units/ml Monitor platelets by anticoagulation protocol: Yes  Plan: Continue heparin infusion at 2100 units/hr Check confirmatory aPTT, heparin level in 6 hrs Monitor daily aPTT, heparin level, CBC Monitor for bleeding F/U after VVS  procedures  Gillermina Hu, PharmD, BCPS, [facility] Clinical Pharmacist 11/13/2020 9:35 PM

## 2020-11-14 ENCOUNTER — Encounter (HOSPITAL_COMMUNITY)
Admission: EM | Disposition: A | Discharge status: Home / Self Care | Payer: Self-pay | Source: Home / Self Care | Attending: Internal Medicine

## 2020-11-14 DIAGNOSIS — J852 Abscess of lung without pneumonia: Secondary | ICD-10-CM

## 2020-11-14 DIAGNOSIS — I82493 Acute embolism and thrombosis of other specified deep vein of lower extremity, bilateral: Secondary | ICD-10-CM

## 2020-11-14 HISTORY — PX: PERIPHERAL VASCULAR THROMBECTOMY: CATH118306

## 2020-11-14 HISTORY — PX: IVC VENOGRAPHY: CATH118301

## 2020-11-14 LAB — CBC
HCT: 39.3 % (ref 39.0–52.0)
HCT: 40.4 % (ref 39.0–52.0)
Hemoglobin: 13.2 g/dL (ref 13.0–17.0)
Hemoglobin: 13.8 g/dL (ref 13.0–17.0)
MCH: 30.2 pg (ref 26.0–34.0)
MCH: 30.2 pg (ref 26.0–34.0)
MCHC: 33.6 g/dL (ref 30.0–36.0)
MCHC: 34.2 g/dL (ref 30.0–36.0)
MCV: 89.9 fL (ref 80.0–100.0)
MCV: 88.4 fL (ref 80.0–100.0)
Platelets: 221 10*3/uL (ref 150–400)
Platelets: 200 10*3/uL (ref 150–400)
RBC: 4.37 MIL/uL (ref 4.22–5.81)
RBC: 4.57 MIL/uL (ref 4.22–5.81)
RDW: 13.6 % (ref 11.5–15.5)
RDW: 13.5 % (ref 11.5–15.5)
WBC: 5.7 10*3/uL (ref 4.0–10.5)
WBC: 5.5 10*3/uL (ref 4.0–10.5)
nRBC: 0 % (ref 0.0–0.2)
nRBC: 0 % (ref 0.0–0.2)

## 2020-11-14 LAB — ANCA TITERS
Atypical P-ANCA titer: <1:20 {titer}
C-ANCA: <1:20 {titer}
P-ANCA: <1:20 {titer}

## 2020-11-14 LAB — COMPREHENSIVE METABOLIC PANEL
ALT: 13 U/L (ref 0–44)
AST: 17 U/L (ref 15–41)
Albumin: 3.1 g/dL — ABNORMAL LOW (ref 3.5–5.0)
Alkaline Phosphatase: 47 U/L (ref 38–126)
Anion gap: 8 (ref 5–15)
BUN: 17 mg/dL (ref 6–20)
CO2: 27 mmol/L (ref 22–32)
Calcium: 9.2 mg/dL (ref 8.9–10.3)
Chloride: 94 mmol/L — ABNORMAL LOW (ref 98–111)
Creatinine, Ser: 1.2 mg/dL (ref 0.61–1.24)
GFR, Estimated: >60 mL/min (ref >60)
Glucose, Bld: 98 mg/dL (ref 70–99)
Potassium: 4 mmol/L (ref 3.5–5.1)
Sodium: 129 mmol/L — ABNORMAL LOW (ref 135–145)
Total Bilirubin: 0.9 mg/dL (ref 0.3–1.2)
Total Protein: 6.4 g/dL — ABNORMAL LOW (ref 6.5–8.1)

## 2020-11-14 LAB — APTT
aPTT: 86 seconds — ABNORMAL HIGH (ref 24–36)
aPTT: 32 seconds (ref 24–36)

## 2020-11-14 LAB — FIBRINOGEN: Fibrinogen: 540 mg/dL — ABNORMAL HIGH (ref 210–475)

## 2020-11-14 LAB — HEPARIN LEVEL (UNFRACTIONATED)
Heparin Unfractionated: 0.89 IU/mL — ABNORMAL HIGH (ref 0.30–0.70)
Heparin Unfractionated: 0.34 IU/mL (ref 0.30–0.70)

## 2020-11-14 SURGERY — PERIPHERAL VASCULAR THROMBECTOMY
Anesthesia: LOCAL

## 2020-11-14 MED ORDER — SODIUM CHLORIDE 0.9% FLUSH: 3.0000 mL | INTRAVENOUS | Status: DC | PRN

## 2020-11-14 MED ORDER — LIDOCAINE 5 % EX PTCH
1.0000 | MEDICATED_PATCH | CUTANEOUS | Status: DC
  Administered 2020-11-14 – 2020-11-16 (×3): 1 via TRANSDERMAL
  Filled 2020-11-14 (×5): qty 1

## 2020-11-14 MED ORDER — HEPARIN (PORCINE) IN NACL 1000-0.9 UT/500ML-% IV SOLN
INTRAVENOUS | Status: AC
  Filled 2020-11-14: qty 500

## 2020-11-14 MED ORDER — SODIUM CHLORIDE 0.9 % IV SOLN: INTRAVENOUS | Status: DC

## 2020-11-14 MED ORDER — LIDOCAINE HCL (PF) 1 % IJ SOLN
INTRAMUSCULAR | Status: DC | PRN
  Administered 2020-11-14 (×2): 5 mL

## 2020-11-14 MED ORDER — HEPARIN (PORCINE) 25000 UT/250ML-% IV SOLN: 1000.0000 [IU]/h | INTRAVENOUS | Status: DC

## 2020-11-14 MED ORDER — ONDANSETRON HCL 4 MG/2ML IJ SOLN: 4.0000 mg | Freq: Four times a day (QID) | INTRAMUSCULAR | Status: DC | PRN

## 2020-11-14 MED ORDER — SODIUM CHLORIDE 0.9 % IV SOLN: 250.0000 mL | INTRAVENOUS | Status: DC | PRN

## 2020-11-14 MED ORDER — IODIXANOL 320 MG/ML IV SOLN
INTRAVENOUS | Status: DC | PRN
  Administered 2020-11-14: 20 mL

## 2020-11-14 MED ORDER — SODIUM CHLORIDE 0.9 % IV SOLN
0.2500 mg/h | INTRAVENOUS | Status: DC
  Filled 2020-11-14 (×2): qty 10

## 2020-11-14 MED ORDER — HEPARIN (PORCINE) IN NACL 1000-0.9 UT/500ML-% IV SOLN
INTRAVENOUS | Status: DC | PRN
  Administered 2020-11-14: 500 mL

## 2020-11-14 MED ORDER — HEPARIN (PORCINE) 25000 UT/250ML-% IV SOLN
400.0000 [IU]/h | INTRAVENOUS | Status: AC
  Administered 2020-11-14: 400 [IU]/h via INTRAVENOUS
  Filled 2020-11-14: qty 250

## 2020-11-14 MED ORDER — CHLORHEXIDINE GLUCONATE CLOTH 2 % EX PADS
6.0000 | MEDICATED_PAD | Freq: Every day | CUTANEOUS | Status: DC
  Administered 2020-11-15 – 2020-11-17 (×3): 6 via TOPICAL

## 2020-11-14 MED ORDER — LIDOCAINE HCL (PF) 1 % IJ SOLN
INTRAMUSCULAR | Status: AC
  Filled 2020-11-14: qty 30

## 2020-11-14 MED ORDER — MIDAZOLAM HCL 2 MG/2ML IJ SOLN: 1.0000 mg | INTRAMUSCULAR | Status: DC | PRN

## 2020-11-14 MED ORDER — MORPHINE SULFATE (PF) 4 MG/ML IV SOLN
4.0000 mg | INTRAVENOUS | Status: DC | PRN
  Administered 2020-11-16: 4 mg via INTRAVENOUS
  Filled 2020-11-14: qty 1

## 2020-11-14 MED ORDER — SODIUM CHLORIDE 0.9 % IV SOLN
0.2500 mg/h | INTRAVENOUS | Status: DC
  Administered 2020-11-14: 0.5 mg/h
  Filled 2020-11-14 (×3): qty 10

## 2020-11-14 MED ORDER — HEPARIN (PORCINE) 25000 UT/250ML-% IV SOLN
1000.0000 [IU]/h | INTRAVENOUS | Status: DC
  Administered 2020-11-14: 400 [IU]/h via INTRAVENOUS
  Filled 2020-11-14: qty 250

## 2020-11-14 MED ORDER — SODIUM CHLORIDE 0.9% FLUSH
3.0000 mL | Freq: Two times a day (BID) | INTRAVENOUS | Status: DC
  Administered 2020-11-14 – 2020-11-17 (×5): 3 mL via INTRAVENOUS

## 2020-11-14 SURGICAL SUPPLY — 11 items
CATH ANGIO 5F BER2 100CM (CATHETERS) ×3 IMPLANT
CATH INFUS 135CMX50CM (CATHETERS) ×6 IMPLANT
GLIDEWIRE ADV .035X260CM (WIRE) ×3 IMPLANT
KIT MICROPUNCTURE NIT STIFF (SHEATH) ×3 IMPLANT
KIT PV (KITS) ×3 IMPLANT
SHEATH PINNACLE 6F 10CM (SHEATH) ×6 IMPLANT
SHEATH PROBE COVER 6X72 (BAG) ×3 IMPLANT
TRAY PV CATH (CUSTOM PROCEDURE TRAY) ×3 IMPLANT
WIRE BENTSON .035X145CM (WIRE) ×3 IMPLANT
WIRE MICROINTRODUCER 60CM (WIRE) ×3 IMPLANT
WIRE TORQFLEX AUST .018X40CM (WIRE) ×3 IMPLANT

## 2020-11-14 NOTE — Op Note (Signed)
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  Ultrasound-guided access of left popliteal vein 2.  Left leg venogram and venacavogram 3.  Placement of left lower extremity thrombolytics catheter with initiation of chemical thrombolysis including the left popliteal, femoral, common femoral, iliac, and IVC 4.  Ultrasound-guided access of right small saphenous vein 5.  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  He presents today for planned bilateral lower extremity thrombolytics catheter placement through popliteal approach with plans for angio [facility] tomorrow.  Risks and benefits have been discussed.  Findings:  I initially accessed the left small saphenous but could not get the wire to advance.  I then accessed the left popliteal vein and had some difficulty getting the wire to track through the left femoral segment suggesting some chronic component to his thrombus but ultimately was able to get into the iliac vein and above the IVC filter where I confirmed with hand-injection that I was in the IVC.  I then accessed the right small saphenous vein and the wire easily advanced through the iliofemoral segment and into the IVC.  Bilateral lower extremity thrombolytics catheters were placed with the tips just above the IVC filter.   Procedure:  The patient was identified in the holding area and taken to room 8.   Patient was placed prone on the table.  Bilateral popliteal fossa's were then prepped and draped.  A timeout was performed.  Initially evaluated the left small saphenous vein and it appeared compressible and patent and image was saved.  This was accessed with a micro access needle and placed a microwire and I could not get the wire to advance even though I could clearly visualize the wire was in the true lumen.  I then elected to access the left popliteal vein and again this was evaluated ultrasound, appeared patent, and image was saved.  I then accessed this with micro access needle and placed a microwire and then a micro sheath.  I did shoot a left leg venogram given had some resistance getting the wire to advance and this did show what look like an occluded femoral segment in the left leg with collaterals.  I then placed a Glidewire advantage and a short 6 French sheath in the left popliteal vein.  I then used a BER 2 catheter with a Glidewire advantage to cross the occluded femoral vein up into the iliac vein and IVC.  I got my catheter above the vena cava filter to confirm I was in the true lumen which I was.  The UniFuse catheter was then placed from the left popliteal access up to the IVC filter.  I then evaluated the right small saphenous vein it was patent and image was saved.  I accessed this with micro access needle placed a microwire and then micro sheath and I then advanced the Glidewire advantage up the right small saphenous vein and this advanced easily through the iliofemoral segment into the IVC.  I then upsized to a short 6 [place] sheath in  the right small saphenous vein and advanced my UniFuse catheter over the wire up into the IVC filter.  Both sheaths were secured and TPA will be initiated at 0.5 mg an hour through each UniFuse catheter with tPA.  He remained stable.  Will be transported to ICU overnight.  Plan: We will initiate thrombolysis at 0.5 mg an hour through each UniFuse catheter.   Will initiate heparin at 400 units an hour through each sheath.  Pharmacy will help titrate heparin.   Marty Heck, MD [facility] [place] Office: [phone]

## 2020-11-14 NOTE — Progress Notes (Signed)
Heparin drip infusing at 400U/hr to each sheath insertion site(behind rt and left knee); total 800U/hr

## 2020-11-14 NOTE — Progress Notes (Signed)
ANTICOAGULATION CONSULT NOTE - Follow-Up  Pharmacy Consult for Heparin (w/ concurrent alteplase for thrombolysis) Indication:  PE and B/L DVT  No Known Allergies  Patient Measurements: Height: 6\' 1"  (185.4 cm) Weight: 108.2 kg (238 lb 9.6 oz) IBW/kg (Calculated) : 79.9 Heparin Dosing Weight: ~TBW  Vital Signs: Temp: 98.5 F (36.9 C) (07/07 1626) Temp Source: Oral (07/07 1626) BP: 166/99 (07/07 1535) Pulse Rate: 80 (07/07 1535)  Labs: Recent Labs    11/12/20 1340 11/12/20 1711 11/12/20 2303 11/13/20 0338 11/13/20 1258 11/13/20 2047 11/14/20 0429  HGB 13.7  --   --  13.1  --   --  13.2  HCT 41.3  --   --  38.7*  --   --  39.3  PLT 205  --   --  217  --   --  221  APTT 36  --    < > 63* 62* 71* 86*  LABPROT 14.2  --   --   --   --   --   --   INR 1.1  --   --   --   --   --   --   HEPARINUNFRC  --  >1.10*  --  >1.10*  --   --  0.89*  CREATININE 1.18  --   --  1.11  --   --  1.20  TROPONINIHS  --  40*  --   --   --   --   --    < > = values in this interval not displayed.     Estimated Creatinine Clearance: 87.6 mL/min (by C-G formula based on SCr of 1.2 mg/dL).  Assessment: 56 YOM with history of DVT, PE, IVC filter on Apixaban who presented on 7/5 with B/L leg pain and was found to have an acute PE and B/L DVTs. Pharmacy was consulted for heparin dosing.   The patient has been on Heparin and was therapeutic with an aPTT of 86 on a rate of 2100 units/hr. Heparin level is falsely elevated with recent Apixaban use.   VVS was consulted on the patient today and given lack of improvement in the leg swelling - the patient was taken for thrombolysis. Now with alteplase running through a catheter and heparin running through a sheath in both the RLE and LLE.   Post-procedural labs show aPTT 32, Heparin level still slightly falsely elevated at 0.34. Fibrinogen 540, CBC wnl. Will continue with checking both aPTT/HL for now until correlated. Will increase the Heparin running in  the sheaths. No issues or bleeding noted per RN.   The patient was previously therapeutic at a rate of 2100 units/hr. Will increase to a similar total rate.   Goal of Therapy: aPTT: 66-102 sec Heparin level 0.3-0.7 units/ml Monitor platelets by anticoagulation protocol: Yes  Plan: - Increase Heparin in each sheath (LLE + RLE) to 1000 units/hr for a total dose of 2000 units/hr - Will continue to monitor q6h HL/aPTT/fibrinogen checks for the next 24 hours - next check at 0000 - Will monitor for bleeding and/or complications  Thank you for allowing pharmacy to be a part of this patient's care.  Alycia Rossetti, PharmD, BCPS Clinical Pharmacist Clinical phone for 11/14/2020: [phone] 11/14/2020 5:20 PM   **Pharmacist phone directory can now be found on [email or web address] ([email or web address]).  Listed under [place].

## 2020-11-14 NOTE — Progress Notes (Signed)
PROGRESS NOTE        PATIENT DETAILS Name: Edgar Olson Age: 60 y.o. Sex: male Date of Birth: 1961/03/03 Admit Date: 11/12/2020 Admitting Physician Ripudeep Krystal Eaton, MD PCP:Pcp, No  Brief Narrative: Patient is a 60 y.o. 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  Started on anticoagulation, antibiotics-and subsequently admitted to the hospitalist service.  See below for further details.  Significant events: 7/5>> presented [facility]-with bilateral leg pain-found to have DVT/PE/cavitary lung lesion-transferred to [facility]   Significant studies: 7/5>> bilateral lower extremity Doppler: Extensive bilateral DVT 7/5>> Doppler/US aorta/IVC/iliacs: Age indeterminate thrombus involving IVC, right common iliac vein, acute thrombus involving left iliac vein. 7/5>> CTA chest: PE in the distal right pulmonary artery, CT evidence of RV strain, 3.8 x 1.9 cm fluid-filled cavitary lesion in the right lower lobe. 7/6>> CT angio aorta/bifemoral: Acute expansile venous thrombus extending from bilateral popliteal veins to just inferior to the renal vein confluence, indwelling IVC filter is occluded.  Antimicrobial therapy: None  Microbiology data: 7/5>> blood culture: No growth 7/5>> COVID/influenza PCR: Negative  Procedures : None  Consults: Vascular surgery, pulmonology  DVT Prophylaxis : IV heparin   Subjective: Some mild improvement in lower extremity swelling.  For thrombolysis today.   Assessment/Plan: Bilateral DVT/PE with occluded IVC filter: Continue IV heparin-no clinical signs of RV strain.  Vascular surgery following-with plans for thrombolysis today-an angio VAC and angio vac/IVC filter removal tomorrow.  Given this is his second episode of VTE-will likely require indefinite anticoagulation.    Right lower  lobe cavitary lesion: Per PCCM-not suspicious for TB-recommendations are to continue Augmentin x3 weeks.  PCCM will arrange for outpatient follow-up.  HTN: BP stable-continue amlodipine.  1.8 cm right adrenal nodule: Radiology recommends CT/MRI in 12 months.  EtOH use: No signs of withdrawal-apparently drinks 5-6 beers every few days.  Watch for signs of withdrawal-on Ativan per CIWA protocol.  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  Continue supportive care.  Obesity: Estimated body mass index is 31.48 kg/m as calculated from the following:   Height as of this encounter: 6\' 1"  (1.854 m).   Weight as of this encounter: 108.2 kg.    Diet: Diet Order             Diet NPO time specified  Diet effective midnight                    Code Status: Full code   Family Communication: None at bedside  Disposition Plan: Status is: Inpatient  Remains inpatient appropriate because:IV treatments appropriate due to intensity of illness or inability to take PO  Dispo: The patient is from: Home              Anticipated d/c is to: Home              Patient currently is not medically stable to d/c.   Difficult to place patient No   Barriers to Discharge: Extensive bilateral lower extremity DVT-pulmonary embolism-on IV heparin-vascular surgery planning thrombolysis and angio vac later this week.  Antimicrobial agents: Anti-infectives (From admission, onward)    Start     Dose/Rate Route Frequency Ordered Stop   11/12/20 2000  amoxicillin-clavulanate (AUGMENTIN) 875-125 MG per tablet 1 tablet        1 tablet Oral Every 12 hours 11/12/20 1909          Time spent: 35 minutes-Greater than 50% of this time was spent in counseling, explanation of diagnosis, planning of further management, and coordination of care.  MEDICATIONS: Scheduled Meds:  amLODipine  5 mg Oral Daily   amoxicillin-clavulanate  1 tablet Oral  Q12H   cyclobenzaprine  10 mg Oral TID   folic acid  1 mg Oral Daily   gabapentin  100 mg Oral TID   multivitamin with minerals  1 tablet Oral Daily   thiamine  100 mg Oral Daily   Or   thiamine  100 mg Intravenous Daily   Continuous Infusions:  sodium chloride 100 mL/hr at 11/14/20 1023   heparin 2,100 Units/hr (11/14/20 0621)   PRN Meds:.acetaminophen **OR** acetaminophen, HYDROmorphone (DILAUDID) injection, LORazepam **OR** LORazepam, polyethylene glycol   PHYSICAL EXAM: Vital signs: Vitals:   11/13/20 2001 11/13/20 2300 11/14/20 0436 11/14/20 1015  BP: (!) 145/87 (!) 156/92 (!) 149/82 (!) 138/92  Pulse: 88  72   Resp: 18  16   Temp: 98 F (36.7 C)  98.2 F (36.8 C)   TempSrc: Oral  Oral   SpO2: 95%  99%   Weight:      Height:       Filed Weights   11/12/20 1151 11/12/20 2240  Weight: 99.8 kg 108.2 kg   Body mass index is 31.48 kg/m.   Gen Exam:Alert awake-not in any distress HEENT:atraumatic, normocephalic Chest: B/L clear to auscultation anteriorly CVS:S1S2 regular Abdomen:soft non tender, non distended Extremities: Mild bilateral leg swelling today. Neurology: Non focal Skin: no rash   I have personally reviewed following labs and imaging studies  LABORATORY DATA: CBC: Recent Labs  Lab 11/12/20 1340 11/13/20 0338 11/14/20 0429  WBC 7.5 6.3 5.7  HGB 13.7 13.1 13.2  HCT 41.3 38.7* 39.3  MCV 91.2 89.4 89.9  PLT 205 217 221     Basic Metabolic Panel: Recent Labs  Lab 11/12/20 1340 11/12/20 1839 11/13/20 0338 11/14/20 0429  NA 129*  --  128* 129*  K 4.5  --  3.9 4.0  CL 94*  --  96* 94*  CO2 25  --  24 27  GLUCOSE 100*  --  128* 98  BUN 19  --  17 17  CREATININE 1.18  --  1.11 1.20  CALCIUM 9.3  --  9.3 9.2  MG  --  2.2  --   --   PHOS  --  3.3  --   --      GFR: Estimated Creatinine Clearance: 87.6 mL/min (by C-G formula based on SCr of 1.2 mg/dL).  Liver Function Tests: Recent Labs  Lab 11/13/20 0338 11/14/20 0429  AST 16  17  ALT 12 13  ALKPHOS 47 47  BILITOT 1.0 0.9  PROT 6.5 6.4*  ALBUMIN 3.3* 3.1*    No results for input(s): LIPASE, AMYLASE in the last 168 hours. No results for input(s): AMMONIA in the last 168 hours.  Coagulation Profile: Recent Labs  Lab 11/12/20 1340  INR 1.1     Cardiac Enzymes: No results for input(s): CKTOTAL, CKMB, CKMBINDEX, TROPONINI in the last 168 hours.  BNP (last 3 results) No results for input(s): PROBNP in the last 8760 hours.  Lipid Profile: No results for input(s): CHOL, HDL, LDLCALC, TRIG, CHOLHDL, LDLDIRECT in the last 72 hours.  Thyroid  Function Tests: Recent Labs    11/13/20 1258  TSH 3.154    Anemia Panel: No results for input(s): VITAMINB12, FOLATE, FERRITIN, TIBC, IRON, RETICCTPCT in the last 72 hours.  Urine analysis: No results found for: COLORURINE, APPEARANCEUR, LABSPEC, PHURINE, GLUCOSEU, HGBUR, BILIRUBINUR, KETONESUR, PROTEINUR, UROBILINOGEN, NITRITE, LEUKOCYTESUR  Sepsis Labs: Lactic Acid, Venous No results found for: LATICACIDVEN  MICROBIOLOGY: Recent Results (from the past 240 hour(s))  Resp Panel by RT-PCR (Flu A&B, Covid) Nasopharyngeal Swab     Status: None   Collection Time: 11/12/20  5:11 PM   Specimen: Nasopharyngeal Swab; Nasopharyngeal(NP) swabs in vial transport medium  Result Value Ref Range Status   SARS Coronavirus 2 by RT PCR NEGATIVE NEGATIVE Final    Comment: (NOTE) SARS-CoV-2 target nucleic acids are NOT DETECTED.  The SARS-CoV-2 RNA is generally detectable in upper respiratory specimens during the acute phase of infection. The lowest concentration of SARS-CoV-2 viral copies this assay can detect is 138 copies/mL. A negative result does not preclude SARS-Cov-2 infection and should not be used as the sole basis for treatment or other patient management decisions. A negative result may occur with  improper specimen collection/handling, submission of specimen other than nasopharyngeal swab, presence of viral  mutation(s) within the areas targeted by this assay, and inadequate number of viral copies(<138 copies/mL). A negative result must be combined with clinical observations, patient history, and epidemiological information. The expected result is Negative.  Fact Sheet for Patients:  EntrepreneurPulse.com.au  Fact Sheet for Healthcare Providers:  IncredibleEmployment.be  This test is no t yet approved or cleared by the [place] FDA and  has been authorized for detection and/or diagnosis of SARS-CoV-2 by FDA under an Emergency Use Authorization (EUA). This EUA will remain  in effect (meaning this test can be used) for the duration of the COVID-19 declaration under Section 564(b)(1) of the Act, 21 U.S.C.section 360bbb-3(b)(1), unless the authorization is terminated  or revoked sooner.       Influenza A by PCR NEGATIVE NEGATIVE Final   Influenza B by PCR NEGATIVE NEGATIVE Final    Comment: (NOTE) The Xpert Xpress SARS-CoV-2/FLU/RSV plus assay is intended as an aid in the diagnosis of influenza from Nasopharyngeal swab specimens and should not be used as a sole basis for treatment. Nasal washings and aspirates are unacceptable for Xpert Xpress SARS-CoV-2/FLU/RSV testing.  Fact Sheet for Patients: EntrepreneurPulse.com.au  Fact Sheet for Healthcare Providers: IncredibleEmployment.be  This test is not yet approved or cleared by the [place] FDA and has been authorized for detection and/or diagnosis of SARS-CoV-2 by FDA under an Emergency Use Authorization (EUA). This EUA will remain in effect (meaning this test can be used) for the duration of the COVID-19 declaration under Section 564(b)(1) of the Act, 21 U.S.C. section 360bbb-3(b)(1), unless the authorization is terminated or revoked.  Performed at [facility], [place] [address] [place], [place] [address]   Culture, blood (Routine  X 2) w Reflex to ID Panel     Status: None (Preliminary result)   Collection Time: 11/12/20  6:25 PM   Specimen: BLOOD  Result Value Ref Range Status   Specimen Description   Final    BLOOD SITE NOT SPECIFIED Performed at [facility] [address], [place], [place] [address]    Special Requests   Final    BOTTLES DRAWN AEROBIC ONLY Blood Culture adequate volume Performed at [facility] [address] [place], [place] [address]    Culture  Final    NO GROWTH 2 DAYS Performed at [facility], [place] [address] [place], [place] [address]    Report Status PENDING  Incomplete  Culture, blood (Routine X 2) w Reflex to ID Panel     Status: None (Preliminary result)   Collection Time: 11/12/20  6:39 PM   Specimen: BLOOD  Result Value Ref Range Status   Specimen Description   Final    BLOOD RIGHT ANTECUBITAL Performed at [facility] [address] [place], [place] [address]    Special Requests   Final    BOTTLES DRAWN AEROBIC AND ANAEROBIC Blood Culture adequate volume Performed at [facility] [address] [place], [place] [address]    Culture   Final    NO GROWTH 2 DAYS Performed at [facility] [address] [place],  [address]    Report Status PENDING  Incomplete    RADIOLOGY STUDIES/RESULTS: DG Abd 1 View  Result Date: 11/12/2020 CLINICAL DATA:  Encounter for IVC location. Patient reports IVC filter was placed 2 years ago. EXAM: ABDOMEN - 1 VIEW COMPARISON:  None. FINDINGS: IVC filter is seen to the right of L2-L3. normal visualized bowel gas pattern. Excreted IV contrast within both renal collecting systems from prior CT. Kyphoplasty within lower thoracic vertebra, partially included. IMPRESSION: IVC filter to the right of L2-L3. Electronically Signed   By: Keith Rake M.D.   On: 11/12/2020 16:10   CT Angio Chest PE W and/or Wo Contrast  Result Date:  11/12/2020 CLINICAL DATA:  Bilateral lower extremity swelling. EXAM: CT ANGIOGRAPHY CHEST WITH CONTRAST TECHNIQUE: Multidetector CT imaging of the chest was performed using the standard protocol during bolus administration of intravenous contrast. Multiplanar CT image reconstructions and MIPs were obtained to evaluate the vascular anatomy. CONTRAST:  72mL OMNIPAQUE IOHEXOL 350 MG/ML SOLN COMPARISON:  None. FINDINGS: Cardiovascular: Large linear pulmonary embolus is noted in the distal right pulmonary artery which extends into the upper and lower lobe branches. RV/LV ratio of 1.4 is noted suggesting right heart strain. Normal cardiac size. Coronary artery calcifications are noted. No pericardial effusion is noted. Filling defect is also seen in upper lobe branch of the left pulmonary artery consistent with pulmonary embolus. Mediastinum/Nodes: No enlarged mediastinal, hilar, or axillary lymph nodes. Thyroid gland, trachea, and esophagus demonstrate no significant findings. Lungs/Pleura: No pneumothorax or pleural effusion is noted. 3.8 x 1.9 cm fluid-filled cavitary abnormality is noted posteriorly in the right lower lobe concerning for possible abscess or infected bulla. Upper Abdomen: 1.8 cm right adrenal nodule is noted. Musculoskeletal: Status post kyphoplasty at 2 lower thoracic levels. Review of the MIP images confirms the above findings. IMPRESSION: Large linear pulmonary embolus is noted in the distal right pulmonary artery which extends into the upper and lower lobe branches. Smaller embolus is seen in upper lobe branch of left pulmonary artery. Positive for acute PE with CT evidence of right heart strain (RV/LV Ratio = 1.4) consistent with at least submassive (intermediate risk) PE. The presence of right heart strain has been associated with an increased risk of morbidity and mortality. Please refer to the "PE Focused" order set in EPIC. 3.8 x 1.9 cm fluid-filled cavitary abnormality is noted posteriorly in  the right lower lobe concerning for possible abscess or infected bulla. Critical Value/emergent results were called by telephone at the time of interpretation on 11/12/2020 at 4:07 pm to provider [facility] , who verbally acknowledged these results. 1.8 cm right adrenal  nodule is noted. Follow-up CT or MRI in 12 months is recommended to ensure stability. Electronically Signed   By: Marijo Conception M.D.   On: 11/12/2020 16:09   CT ANGIO AO+BIFEM W & OR WO CONTRAST  Result Date: 11/13/2020 CLINICAL DATA:  60 year old male with occluded indwelling inferior vena cava filter. EXAM: CT ANGIOGRAPHY OF ABDOMINAL AORTA WITH ILIOFEMORAL RUNOFF TECHNIQUE: Multidetector CT imaging of the abdomen, pelvis and lower extremities was performed using the standard protocol during bolus administration of intravenous contrast. Multiplanar CT image reconstructions and MIPs were obtained to evaluate the vascular anatomy. CONTRAST:  17mL OMNIPAQUE IOHEXOL 350 MG/ML SOLN COMPARISON:  None. FINDINGS: VASCULAR Aorta: Normal caliber aorta without aneurysm, dissection, vasculitis or significant stenosis. Celiac: Patent without evidence of aneurysm, dissection, vasculitis or significant stenosis. SMA: Patent without evidence of aneurysm, dissection, vasculitis or significant stenosis. Renals: Single bilateral renal arteries are patent without evidence of aneurysm, dissection, vasculitis, fibromuscular dysplasia or significant stenosis. IMA: Patent without evidence of aneurysm, dissection, vasculitis or significant stenosis. RIGHT Lower Extremity Inflow: Common, internal and external iliac arteries are patent without evidence of aneurysm, dissection, vasculitis or significant stenosis. Outflow: Tapering of the distal superficial femoral artery at the level of Hunter's canal with scattered atherosclerotic plaque to complete occlusion of the popliteal artery. There are prominent perigeniculate collaterals. Common and profunda femoral arteries are  patent without evidence of aneurysm, dissection, vasculitis or significant stenosis. Runoff: There are separate distal reconstitutions to the proximal anterior tibial artery, peroneal arter, and posterior tibial arteries. Patent 3 vessel runoff to the level of the foot. LEFT Lower Extremity Inflow: Common, internal and external iliac arteries are patent without evidence of aneurysm, dissection, vasculitis or significant stenosis. Outflow: Occlusion of the popliteal artery throughout its length. Prominent perigeniculate collaterals. The common, profundal, and superficial femoral arteries are patent. Runoff: Geniculate collaterals reconstitute to the trifurcation. The distal peroneal is diminutive. The posterior tibial anterior tibial arteries are patent to the level of the ankle, diminutive below. Veins: Expansile, acute appearing thrombus extending throughout the bilateral popliteal and femoral veins, through the common femoral veins, external iliac veins, common iliac veins, an infrarenal inferior vena cava through the level of the indwelling infrarenal inferior vena cava filter. The filter appears well position without complicating features. The bilateral renal veins and suprarenal inferior vena cava are patent. Review of the MIP images confirms the above findings. NON-VASCULAR Lower chest: Similar appearing irregularly-shaped right basilar cavitary lesion with air-fluid levels measuring up to approximately 4.0 x 2.0 cm. Scattered bibasilar subsegmental atelectasis. Similar appearing, partially visualized acute pulmonary embolism extending from the distal right main pulmonary artery into the lobar branches. The RV to LV ratio is calculated at 1.3. Hepatobiliary: No focal liver abnormality is seen. No gallstones, gallbladder wall thickening, or biliary dilatation. Pancreas: Unremarkable. No pancreatic ductal dilatation or surrounding inflammatory changes. Spleen: Normal in size without focal abnormality.  Adrenals/Urinary Tract: Unchanged 1.8 cm right adrenal nodule. The left adrenal gland is within normal limits. The kidneys are symmetric in size and enhancement bilaterally. No focal lesions or nephrolithiasis. No hydronephrosis. The bladder is distended with excreted previously administered intravenous contrast material. Node definite wall thickening. Stomach/Bowel: Stomach is within normal limits. Appendix appears normal. No evidence of bowel wall thickening, distention, or inflammatory changes. Lymphatic: No abdominopelvic lymphadenopathy. Reproductive: Prostate is upper limits of normal size (measures 5.0 cm in maximum axial dimension.). Other: No abdominal wall hernia or abnormality. No abdominopelvic ascites. Musculoskeletal: Status post segment vertebral body augmentation at T9  and S97 without complicating features. No acute osseous abnormality. IMPRESSION: VASCULAR 1. Acute expansile venous thrombus extending from the bilateral popliteal veins to just inferior to the renal vein confluence. The indwelling infrarenal inferior vena cava filter is occluded. No evidence of significant pelvic or lumbar collateralization. There are associated edematous changes in the bilateral lower extremities, right greater than left. 2. 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 Chronic occlusion of the left popliteal artery with three-vessel (but distally diminutive peroneal) runoff supplied by perigeniculate collaterals. 3. Similar appearing though partially visualized acute distal right main pulmonary embolism extending into the lobar branches. There is associated right heart strain within RV to LV ratio of 1.3, similar to comparison. NON-VASCULAR 1. No acute abdominopelvic abnormality. 2. Similar appearing cavitary lesion in the right lower lobe. This remains suspicious for infected pulmonary abscess. 3. Status  post T9-T11 vertebral body cement augmentation. 4. Prostatomegaly. 5. Similar appearing 1.8 cm right adrenal nodule. Agree with recent prior recommendation of follow-up CT or MRI adrenal mass protocol in 12 months. Ruthann Cancer, MD [facility] Electronically Signed   By: Ruthann Cancer MD   On: 11/13/2020 16:44   ECHOCARDIOGRAM COMPLETE  Result Date: 11/13/2020    ECHOCARDIOGRAM REPORT   Patient Name:   KOLTEN RYBACK Date of Exam: 11/13/2020 Medical Rec #:  [id number]       Height:       73.0 in Accession #:    [id number]      Weight:       238.6 lb Date of Birth:  07/08/1960       BSA:          2.319 m Patient Age:    3 years        BP:           133/87 mmHg Patient Gender: M               HR:           90 bpm. Exam Location:  Inpatient Procedure: 2D Echo, Cardiac Doppler and Color Doppler Indications:    Pulmonary Embolus I26.09  History:        Patient has no prior history of Echocardiogram examinations.                 Risk Factors:Hypertension. DVT.  Sonographer:    Vickie Epley RDCS Referring Phys: [id number] RIPUDEEP K RAI IMPRESSIONS  1. Left ventricular ejection fraction, by estimation, is 50 to 55%. The left ventricle has low normal function. The left ventricle demonstrates regional wall motion abnormalities (see scoring diagram/findings for description). Left ventricular diastolic  parameters were normal. There is mild hypokinesis of the left ventricular, basal inferoseptal wall, inferior wall and inferolateral wall.  2. Right ventricular systolic function is normal. The right ventricular size is normal. There is normal pulmonary artery systolic pressure.  3. The mitral valve is normal in structure. Trivial mitral valve regurgitation. No evidence of mitral stenosis.  4. The aortic valve was not well visualized. There is mild calcification of the aortic valve. Aortic valve regurgitation is trivial.  5. The inferior vena cava is normal in size with  greater than 50% respiratory variability, suggesting right atrial pressure of 3 mmHg. Comparison(s): No prior Echocardiogram. Conclusion(s)/Recommendation(s): Mild abnormal wall motion in basal inferoseptum/inferior/inferolateral walls with low normal EF. RV incompletely visualized but appears grossly normal. FINDINGS  Left Ventricle: Left ventricular  ejection fraction, by estimation, is 50 to 55%. The left ventricle has low normal function. The left ventricle demonstrates regional wall motion abnormalities. Mild hypokinesis of the left ventricular, basal inferoseptal  wall, inferior wall and inferolateral wall. The left ventricular internal cavity size was normal in size. There is no left ventricular hypertrophy. Left ventricular diastolic parameters were normal. Right Ventricle: The right ventricular size is normal. Right vetricular wall thickness was not well visualized. Right ventricular systolic function is normal. There is normal pulmonary artery systolic pressure. The tricuspid regurgitant velocity is 1.67 m/s, and with an assumed right atrial pressure of 3 mmHg, the estimated right ventricular systolic pressure is 73.7 mmHg. Left Atrium: Left atrial size was normal in size. Right Atrium: Right atrial size was normal in size. Pericardium: There is no evidence of pericardial effusion. Mitral Valve: The mitral valve is normal in structure. Trivial mitral valve regurgitation. No evidence of mitral valve stenosis. Tricuspid Valve: The tricuspid valve is normal in structure. Tricuspid valve regurgitation is trivial. No evidence of tricuspid stenosis. Aortic Valve: The aortic valve was not well visualized. There is mild calcification of the aortic valve. Aortic valve regurgitation is trivial. Pulmonic Valve: The pulmonic valve was not well visualized. Pulmonic valve regurgitation is not visualized. Aorta: The aortic root, ascending aorta, aortic arch and descending aorta are all structurally normal, with no  evidence of dilitation or obstruction. Venous: The inferior vena cava is normal in size with greater than 50% respiratory variability, suggesting right atrial pressure of 3 mmHg. IAS/Shunts: The interatrial septum was not well visualized.  LEFT VENTRICLE PLAX 2D LVIDd:         5.40 cm      Diastology LVIDs:         4.10 cm      LV e' medial:    8.27 cm/s LV PW:         1.00 cm      LV E/e' medial:  5.2 LV IVS:        1.00 cm      LV e' lateral:   10.00 cm/s LVOT diam:     2.20 cm      LV E/e' lateral: 4.3 LV SV:         60 LV SV Index:   26 LVOT Area:     3.80 cm  LV Volumes (MOD) LV vol d, MOD A2C: 128.0 ml LV vol d, MOD A4C: 117.0 ml LV vol s, MOD A2C: 58.7 ml LV vol s, MOD A4C: 53.8 ml LV SV MOD A2C:     69.3 ml LV SV MOD A4C:     117.0 ml LV SV MOD BP:      68.1 ml RIGHT VENTRICLE RV S prime:     19.40 cm/s TAPSE (M-mode): 2.4 cm LEFT ATRIUM             Index       RIGHT ATRIUM           Index LA diam:        3.70 cm 1.60 cm/m  RA Area:     14.90 cm LA Vol (A2C):   39.1 ml 16.84 ml/m RA Volume:   39.00 ml  16.82 ml/m LA Vol (A4C):   25.9 ml 11.17 ml/m LA Biplane Vol: 36.2 ml 15.61 ml/m  AORTIC VALVE LVOT Vmax:   84.20 cm/s LVOT Vmean:  57.600 cm/s LVOT VTI:    0.158 m  AORTA Ao Root diam: 3.70 cm Ao Asc  diam:  3.70 cm Ao Desc diam: 2.70 cm MITRAL VALVE               TRICUSPID VALVE MV Area (PHT): 2.50 cm    TR Peak grad:   11.2 mmHg MV Decel Time: 303 msec    TR Vmax:        167.00 cm/s MV E velocity: 43.30 cm/s MV A velocity: 69.80 cm/s  SHUNTS MV E/A ratio:  0.62        Systemic VTI:  0.16 m                            Systemic Diam: 2.20 cm Buford Dresser MD Electronically signed by Buford Dresser MD Signature Date/Time: 11/13/2020/6:18:19 PM    Final    VAS [place] LOWER EXTREMITY VENOUS (DVT) (ONLY [facility] & [facility])  Result Date: 11/12/2020  Lower Venous DVT Study Patient Name:  MALIKYE REPPOND  Date of Exam:   11/12/2020 Medical Rec #: [id number]        Accession #:    [id number] Date of Birth:  01-10-1961        Patient Gender: M Patient Age:   59Y Exam Location:  [facility] Procedure:      VAS [place] LOWER EXTREMITY VENOUS (DVT) Referring Phys: [id number] JON KNAPP --------------------------------------------------------------------------------  Indications: Swelling, and Pain.  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 Anticoagulation: Eliquis. Limitations: Poor ultrasound/tissue interface. Performing Technologist: Rogelia Rohrer RVT, RDMS  Examination Guidelines: A complete evaluation includes B-mode imaging, spectral Doppler, color Doppler, and power Doppler as needed of all accessible portions of each vessel. Bilateral testing is considered an integral part of a complete examination. Limited examinations for reoccurring indications may be performed as noted. The reflux portion of the exam is performed with the patient in reverse Trendelenburg.  +---------+---------------+---------+-----------+----------+-----------------+ RIGHT    CompressibilityPhasicitySpontaneityPropertiesThrombus Aging    +---------+---------------+---------+-----------+----------+-----------------+ CFV      Partial        Yes      Yes                  Age Indeterminate +---------+---------------+---------+-----------+----------+-----------------+ SFJ      Partial                                      Acute             +---------+---------------+---------+-----------+----------+-----------------+ FV Prox  None           No       No                   Acute             +---------+---------------+---------+-----------+----------+-----------------+ FV Mid   None           No       No                   Acute             +---------+---------------+---------+-----------+----------+-----------------+ FV DistalNone           No       No                   Acute              +---------+---------------+---------+-----------+----------+-----------------+  PFV      None           No       No                   Acute             +---------+---------------+---------+-----------+----------+-----------------+ POP      None           No       No                   Acute             +---------+---------------+---------+-----------+----------+-----------------+ PTV      None           No       No                   Acute             +---------+---------------+---------+-----------+----------+-----------------+ PERO     None           No       No                   Acute             +---------+---------------+---------+-----------+----------+-----------------+ Gastroc  None           No       No                   Acute             +---------+---------------+---------+-----------+----------+-----------------+   +---------+---------------+---------+-----------+----------+--------------+ LEFT     CompressibilityPhasicitySpontaneityPropertiesThrombus Aging +---------+---------------+---------+-----------+----------+--------------+ CFV      Partial        Yes      Yes                  Acute          +---------+---------------+---------+-----------+----------+--------------+ SFJ      Partial                                      Acute          +---------+---------------+---------+-----------+----------+--------------+ FV Prox  Full           Yes      Yes                                 +---------+---------------+---------+-----------+----------+--------------+ FV Mid   None           No       No                   Acute          +---------+---------------+---------+-----------+----------+--------------+ FV DistalNone           No       No                   Acute          +---------+---------------+---------+-----------+----------+--------------+ PFV      Full           Yes      Yes                                  +---------+---------------+---------+-----------+----------+--------------+  POP      Partial        No       No                   Acute          +---------+---------------+---------+-----------+----------+--------------+ PTV      Full                                                        +---------+---------------+---------+-----------+----------+--------------+ PERO     Full                                                        +---------+---------------+---------+-----------+----------+--------------+     Summary: BILATERAL: -No evidence of popliteal cyst, bilaterally. RIGHT: - Findings consistent with acute deep vein thrombosis involving the SF junction, right femoral vein, right proximal profunda vein, right popliteal vein, right posterior tibial veins, right peroneal veins, and right gastrocnemius veins. - Findings consistent with age indeterminate deep vein thrombosis involving the right common femoral vein. - There is no evidence of superficial venous thrombosis.  LEFT: - Findings consistent with acute deep vein thrombosis involving the left common femoral vein, SF junction, left femoral vein, and left popliteal vein.  *See table(s) above for measurements and observations. Electronically signed by Ruta Hinds MD on 11/12/2020 at 3:53:09 PM.    Final    VAS US AORTA/IVC/ILIACS  Result Date: 11/12/2020 IVC/ILIAC STUDY Patient Name:  DARRAL RISHEL  Date of Exam:   11/12/2020 Medical Rec #: [id number]        Accession #:    [id number] Date of Birth: 12/07/1960        Patient Gender: M Patient Age:   59Y Exam Location:  [facility] Procedure:      VAS US AORTA/IVC/ILIACS Referring Phys: [id number] JON KNAPP --------------------------------------------------------------------------------  Indications: DVT OF BLE with extension into iliac system. 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                Paitent states he has IV filter also. Medical records  unavailable                ([place] of [place]  Comparison Study: No previous exams in our system. Performing Technologist: Rogelia Rohrer RVT, RDMS  Examination Guidelines: A complete evaluation includes B-mode imaging, spectral Doppler, color Doppler, and power Doppler as needed of all accessible portions of each vessel. Bilateral testing is considered an integral part of a complete examination. Limited examinations for reoccurring indications may be performed as noted.  IVC/Iliac Findings: +----------+------+--------+------------------------------+    IVC    PatentThrombus           Comments            +----------+------+--------+------------------------------+ IVC Prox  patent                                       +----------+------+--------+------------------------------+ IVC Mid         chronic mixed thrombus - acute/chronic +----------+------+--------+------------------------------+  IVC Distal       acute                                 +----------+------+--------+------------------------------+  +-------------+---------+-----------+---------+-----------+--------------------+      CIV     RT-PatentRT-ThrombusLT-PatentLT-Thrombus      Comments       +-------------+---------+-----------+---------+-----------+--------------------+ Common Iliac            chronic              acute    RT mixed thrombus   Mid                                                     acute/chronic     +-------------+---------+-----------+---------+-----------+--------------------+  +--------------+---------+-----------+---------+-----------+-------------------+      EIV      RT-PatentRT-ThrombusLT-PatentLT-Thrombus     Comments       +--------------+---------+-----------+---------+-----------+-------------------+ External Iliac           chronic              acute   RT mixed thrombus - Vein Mid                                                 acute/chronic     +--------------+---------+-----------+---------+-----------+-------------------+   Summary: IVC/Iliac: There is evidence of age indeterminate thrombus involving the IVC. There is evidence of age indeterminate thrombus involving the right common iliac vein. There is evidence of acute thrombus involving the left common iliac vein. There is evidence of age indeterminate thrombus involving the right external iliac vein. There is evidence of acute thrombus involving the left external iliac vein.  *See table(s) above for measurements and observations.  Electronically signed by Ruta Hinds MD on 11/12/2020 at 3:47:02 PM.    Final      LOS: 2 days   Oren Binet, MD  [facility]    To contact the attending provider between 7A-7P or the covering provider during after hours 7P-7A, please log into the web site [email or web address] and access using universal [place] password for that web site. If you do not have the password, please call the hospital operator.  11/14/2020, 12:20 PM

## 2020-11-14 NOTE — Progress Notes (Signed)
[place] for  heparin Indication:  PE  No Known Allergies  Patient Measurements: Height: 6\' 1"  (185.4 cm) Weight: 108.2 kg (238 lb 9.6 oz) IBW/kg (Calculated) : 79.9 Heparin Dosing Weight: ~TBW  Vital Signs: Temp: 98.2 F (36.8 C) (07/07 0436) Temp Source: Oral (07/07 0436) BP: 149/82 (07/07 0436) Pulse Rate: 72 (07/07 0436)  Labs: Recent Labs    11/12/20 1340 11/12/20 1711 11/12/20 2303 11/13/20 0338 11/13/20 1258 11/13/20 2047 11/14/20 0429  HGB 13.7  --   --  13.1  --   --  13.2  HCT 41.3  --   --  38.7*  --   --  39.3  PLT 205  --   --  217  --   --  221  APTT 36  --    < > 63* 62* 71* 86*  LABPROT 14.2  --   --   --   --   --   --   INR 1.1  --   --   --   --   --   --   HEPARINUNFRC  --  >1.10*  --  >1.10*  --   --  0.89*  CREATININE 1.18  --   --  1.11  --   --  1.20  TROPONINIHS  --  40*  --   --   --   --   --    < > = values in this interval not displayed.     Estimated Creatinine Clearance: 87.6 mL/min (by C-G formula based on SCr of 1.2 mg/dL).  Assessment: 60 y.o. male with h/o VTE on Eliquis, now with new PE, for heparin.   aPTT came back therapeutic at 86, on 2100 units/hr. HL came back supratherapeutic at 0.89. aPTT/HL still do not correlate. No s/sx of bleeding or infusion issues. Hgb 13.2, plt wnl.  Goal of Therapy: aPTT: 66-102 sec Heparin level 0.3-0.7 units/ml Monitor platelets by anticoagulation protocol: Yes  Plan: Continue heparin infusion at 2100 units/hr Order aPTT/HL for tomorrow AM Monitor CBC, aPTT/HL until correlate, and for s/sx of bleeding  F/u plan for thrombolysis Thursday followed by angiovac Friday per vascular  Vance Peper, PharmD PGY1 Resident Phone: [phone] 11/14/2020 8:20 AM   Please check AMION for all [place] phone numbers After 10:00 PM, call [place] [phone]

## 2020-11-14 NOTE — Consult Note (Signed)
Patient with maybe some mild improvement in his leg swelling.  Our office made multiple attempts to obtain records regarding the time of his admission and placement of his IVC filter but were unsuccessful.  At this point plan will be to proceed with thrombolysis today angio [facility] tomorrow with possible removal of the IVC filter.  Procedure risks benefits discussed with patient again today.  Ruta Hinds, MD [facility] [place] Office: [phone]

## 2020-11-15 ENCOUNTER — Encounter (HOSPITAL_COMMUNITY)
Admission: EM | Disposition: A | Discharge status: Home / Self Care | Payer: Self-pay | Source: Home / Self Care | Attending: Internal Medicine

## 2020-11-15 ENCOUNTER — Inpatient Hospital Stay (HOSPITAL_COMMUNITY): Payer: Self-pay

## 2020-11-15 ENCOUNTER — Encounter (HOSPITAL_COMMUNITY): Payer: Self-pay | Admitting: Vascular Surgery

## 2020-11-15 ENCOUNTER — Inpatient Hospital Stay (HOSPITAL_COMMUNITY): Payer: Self-pay | Admitting: Anesthesiology

## 2020-11-15 DIAGNOSIS — I871 Compression of vein: Secondary | ICD-10-CM

## 2020-11-15 DIAGNOSIS — I82523 Chronic embolism and thrombosis of iliac vein, bilateral: Secondary | ICD-10-CM

## 2020-11-15 DIAGNOSIS — I82521 Chronic embolism and thrombosis of right iliac vein: Secondary | ICD-10-CM

## 2020-11-15 HISTORY — PX: VENA CAVA FILTER PLACEMENT: SHX1085

## 2020-11-15 HISTORY — PX: ULTRASOUND GUIDANCE FOR VASCULAR ACCESS: SHX6516

## 2020-11-15 HISTORY — PX: VENOGRAM: SHX5497

## 2020-11-15 HISTORY — PX: APPLICATION OF ANGIOVAC: SHX6777

## 2020-11-15 HISTORY — PX: MECHANICAL THROMBECTOMY WITH AORTOGRAM AND INTERVENTION: SHX6836

## 2020-11-15 HISTORY — PX: VENOPLASTY: SHX6195

## 2020-11-15 LAB — BASIC METABOLIC PANEL
Anion gap: 11 (ref 5–15)
BUN: 21 mg/dL — ABNORMAL HIGH (ref 6–20)
CO2: 23 mmol/L (ref 22–32)
Calcium: 8.8 mg/dL — ABNORMAL LOW (ref 8.9–10.3)
Chloride: 96 mmol/L — ABNORMAL LOW (ref 98–111)
Creatinine, Ser: 1.03 mg/dL (ref 0.61–1.24)
GFR, Estimated: >60 mL/min (ref >60)
Glucose, Bld: 101 mg/dL — ABNORMAL HIGH (ref 70–99)
Potassium: 4.2 mmol/L (ref 3.5–5.1)
Sodium: 130 mmol/L — ABNORMAL LOW (ref 135–145)

## 2020-11-15 LAB — CBC
HCT: 37.6 % — ABNORMAL LOW (ref 39.0–52.0)
HCT: 37.4 % — ABNORMAL LOW (ref 39.0–52.0)
Hemoglobin: 12.5 g/dL — ABNORMAL LOW (ref 13.0–17.0)
Hemoglobin: 12.9 g/dL — ABNORMAL LOW (ref 13.0–17.0)
MCH: 29.5 pg (ref 26.0–34.0)
MCH: 30.6 pg (ref 26.0–34.0)
MCHC: 33.2 g/dL (ref 30.0–36.0)
MCHC: 34.5 g/dL (ref 30.0–36.0)
MCV: 88.7 fL (ref 80.0–100.0)
MCV: 88.6 fL (ref 80.0–100.0)
Platelets: 168 10*3/uL (ref 150–400)
Platelets: 154 10*3/uL (ref 150–400)
RBC: 4.24 MIL/uL (ref 4.22–5.81)
RBC: 4.22 MIL/uL (ref 4.22–5.81)
RDW: 13.7 % (ref 11.5–15.5)
RDW: 13.6 % (ref 11.5–15.5)
WBC: 5.5 10*3/uL (ref 4.0–10.5)
WBC: 5.1 10*3/uL (ref 4.0–10.5)
nRBC: 0 % (ref 0.0–0.2)
nRBC: 0 % (ref 0.0–0.2)

## 2020-11-15 LAB — POCT ACTIVATED CLOTTING TIME
Activated Clotting Time: 150 seconds
Activated Clotting Time: 294 seconds
Activated Clotting Time: 289 seconds
Activated Clotting Time: 312 seconds
Activated Clotting Time: 260 seconds
Activated Clotting Time: 306 seconds
Activated Clotting Time: 341 seconds
Activated Clotting Time: 289 seconds

## 2020-11-15 LAB — HEPARIN LEVEL (UNFRACTIONATED)
Heparin Unfractionated: 0.49 IU/mL (ref 0.30–0.70)
Heparin Unfractionated: 0.46 IU/mL (ref 0.30–0.70)
Heparin Unfractionated: 0.67 IU/mL (ref 0.30–0.70)

## 2020-11-15 LAB — APTT
aPTT: 146 seconds — ABNORMAL HIGH (ref 24–36)
aPTT: 106 seconds — ABNORMAL HIGH (ref 24–36)

## 2020-11-15 LAB — TYPE AND SCREEN
ABO/RH(D): B POS
Antibody Screen: NEGATIVE

## 2020-11-15 LAB — ABO/RH: ABO/RH(D): B POS

## 2020-11-15 LAB — FIBRINOGEN
Fibrinogen: 104 mg/dL — ABNORMAL LOW (ref 210–475)
Fibrinogen: 119 mg/dL — ABNORMAL LOW (ref 210–475)

## 2020-11-15 SURGERY — APPLICATION OF ANGIOVAC
Anesthesia: General | Site: Neck | Laterality: Right

## 2020-11-15 MED ORDER — FENTANYL CITRATE (PF) 250 MCG/5ML IJ SOLN
INTRAMUSCULAR | Status: DC | PRN
  Administered 2020-11-15 (×2): 100 ug via INTRAVENOUS
  Administered 2020-11-15 (×3): 50 ug via INTRAVENOUS

## 2020-11-15 MED ORDER — LACTATED RINGERS IV SOLN: INTRAVENOUS | Status: DC | PRN

## 2020-11-15 MED ORDER — HEPARIN SODIUM (PORCINE) 1000 UNIT/ML IJ SOLN
INTRAMUSCULAR | Status: AC
  Filled 2020-11-15: qty 2

## 2020-11-15 MED ORDER — LIDOCAINE 2% (20 MG/ML) 5 ML SYRINGE
INTRAMUSCULAR | Status: DC | PRN
  Administered 2020-11-15: 60 mg via INTRAVENOUS

## 2020-11-15 MED ORDER — FENTANYL CITRATE (PF) 250 MCG/5ML IJ SOLN
INTRAMUSCULAR | Status: AC
  Filled 2020-11-15: qty 5

## 2020-11-15 MED ORDER — HEPARIN (PORCINE) 25000 UT/250ML-% IV SOLN
2100.0000 [IU]/h | INTRAVENOUS | Status: AC
  Administered 2020-11-15: 2100 [IU]/h via INTRAVENOUS
  Filled 2020-11-15 (×2): qty 250

## 2020-11-15 MED ORDER — SODIUM CHLORIDE 0.9% IV SOLUTION: Freq: Once | INTRAVENOUS | Status: DC

## 2020-11-15 MED ORDER — LIDOCAINE 2% (20 MG/ML) 5 ML SYRINGE
INTRAMUSCULAR | Status: AC
  Filled 2020-11-15: qty 5

## 2020-11-15 MED ORDER — HEPARIN SODIUM (PORCINE) 1000 UNIT/ML IJ SOLN
INTRAMUSCULAR | Status: DC | PRN
  Administered 2020-11-15 (×2): 5000 [IU] via INTRAVENOUS
  Administered 2020-11-15: 3000 [IU] via INTRAVENOUS
  Administered 2020-11-15: 10000 [IU] via INTRAVENOUS

## 2020-11-15 MED ORDER — PROPOFOL 10 MG/ML IV BOLUS
INTRAVENOUS | Status: DC | PRN
  Administered 2020-11-15: 40 mg via INTRAVENOUS
  Administered 2020-11-15: 130 mg via INTRAVENOUS

## 2020-11-15 MED ORDER — LABETALOL HCL 5 MG/ML IV SOLN
INTRAVENOUS | Status: DC | PRN
  Administered 2020-11-15: 10 mg via INTRAVENOUS

## 2020-11-15 MED ORDER — SUGAMMADEX SODIUM 200 MG/2ML IV SOLN
INTRAVENOUS | Status: DC | PRN
  Administered 2020-11-15: 420 mg via INTRAVENOUS

## 2020-11-15 MED ORDER — SUGAMMADEX SODIUM 500 MG/5ML IV SOLN
INTRAVENOUS | Status: AC
  Filled 2020-11-15: qty 5

## 2020-11-15 MED ORDER — PROPOFOL 10 MG/ML IV BOLUS
INTRAVENOUS | Status: AC
  Filled 2020-11-15: qty 20

## 2020-11-15 MED ORDER — ONDANSETRON HCL 4 MG/2ML IJ SOLN
INTRAMUSCULAR | Status: DC | PRN
  Administered 2020-11-15: 4 mg via INTRAVENOUS

## 2020-11-15 MED ORDER — POLYVINYL ALCOHOL 1.4 % OP SOLN
1.0000 [drp] | OPHTHALMIC | Status: DC | PRN
  Filled 2020-11-15: qty 15

## 2020-11-15 MED ORDER — PHENYLEPHRINE HCL-NACL 10-0.9 MG/250ML-% IV SOLN
INTRAVENOUS | Status: DC | PRN
  Administered 2020-11-15: 25 ug/min via INTRAVENOUS

## 2020-11-15 MED ORDER — IODIXANOL 320 MG/ML IV SOLN
INTRAVENOUS | Status: DC | PRN
  Administered 2020-11-15: 20 mL via INTRA_ARTERIAL

## 2020-11-15 MED ORDER — ROCURONIUM BROMIDE 10 MG/ML (PF) SYRINGE
PREFILLED_SYRINGE | INTRAVENOUS | Status: AC
  Filled 2020-11-15: qty 10

## 2020-11-15 MED ORDER — CEFAZOLIN SODIUM-DEXTROSE 2-3 GM-%(50ML) IV SOLR
INTRAVENOUS | Status: DC | PRN
  Administered 2020-11-15 (×2): 2 g via INTRAVENOUS

## 2020-11-15 MED ORDER — DEXAMETHASONE SODIUM PHOSPHATE 10 MG/ML IJ SOLN
INTRAMUSCULAR | Status: DC | PRN
  Administered 2020-11-15: 10 mg via INTRAVENOUS

## 2020-11-15 MED ORDER — PHENYLEPHRINE 40 MCG/ML (10ML) SYRINGE FOR IV PUSH (FOR BLOOD PRESSURE SUPPORT)
PREFILLED_SYRINGE | INTRAVENOUS | Status: DC | PRN
  Administered 2020-11-15: 120 ug via INTRAVENOUS

## 2020-11-15 MED ORDER — LABETALOL HCL 5 MG/ML IV SOLN
INTRAVENOUS | Status: AC
  Filled 2020-11-15: qty 4

## 2020-11-15 MED ORDER — MIDAZOLAM HCL 2 MG/2ML IJ SOLN
INTRAMUSCULAR | Status: DC | PRN
  Administered 2020-11-15: 2 mg via INTRAVENOUS

## 2020-11-15 MED ORDER — HEPARIN 6000 UNIT IRRIGATION SOLUTION
Status: DC | PRN
  Administered 2020-11-15 (×2): 1

## 2020-11-15 MED ORDER — HEPARIN SODIUM (PORCINE) 1000 UNIT/ML IJ SOLN
INTRAMUSCULAR | Status: AC
  Filled 2020-11-15: qty 1

## 2020-11-15 MED ORDER — MIDAZOLAM HCL 2 MG/2ML IJ SOLN
INTRAMUSCULAR | Status: AC
  Filled 2020-11-15: qty 2

## 2020-11-15 MED ORDER — ROCURONIUM BROMIDE 10 MG/ML (PF) SYRINGE
PREFILLED_SYRINGE | INTRAVENOUS | Status: DC | PRN
  Administered 2020-11-15: 30 mg via INTRAVENOUS
  Administered 2020-11-15: 40 mg via INTRAVENOUS
  Administered 2020-11-15: 60 mg via INTRAVENOUS
  Administered 2020-11-15: 30 mg via INTRAVENOUS
  Administered 2020-11-15 (×2): 40 mg via INTRAVENOUS

## 2020-11-15 MED ORDER — 0.9 % SODIUM CHLORIDE (POUR BTL) OPTIME
TOPICAL | Status: DC | PRN
  Administered 2020-11-15: 1000 mL

## 2020-11-15 SURGICAL SUPPLY — 91 items
ANGIOVAC CIRCUIT (MISCELLANEOUS)
ANGIOVAC CIRCUIT GEN3 (MISCELLANEOUS) ×4
BAG BANDED W/RUBBER/TAPE 36X54 (MISCELLANEOUS) ×4 IMPLANT
BAG COUNTER SPONGE SURGICOUNT (BAG) ×4 IMPLANT
BALLN MUSTANG 10X80X75 (BALLOONS) ×4
BALLOON MUSTANG 10X80X75 (BALLOONS) ×3 IMPLANT
CANISTER SUCT 3000ML PPV (MISCELLANEOUS) ×4 IMPLANT
CANNULA C180 ANGIOVAC 180 DEG (CANNULA) IMPLANT
CANNULA C20 ANGIOVAC 20 DEG (CANNULA) ×4 IMPLANT
CANNULA OPTISITE PERFUSION 16F (CANNULA) ×4 IMPLANT
CANNULA OPTISITE PERFUSION 18F (CANNULA) IMPLANT
CANNULA OPTISITE PERFUSION 20F (CANNULA) IMPLANT
CATH ANGIO 5F BER2 65CM (CATHETERS) ×4 IMPLANT
CATH ROBINSON RED A/P 18FR (CATHETERS) ×4 IMPLANT
CATH SOFTOUCH MOTARJEME 5F (CATHETERS) ×8 IMPLANT
CATH THROMBEC 7F 135 CLEANER15 (MISCELLANEOUS) ×4 IMPLANT
CATH VISIONS PV .035 IVUS (CATHETERS) ×4 IMPLANT
CIRCUIT ANGIOVAC (MISCELLANEOUS) IMPLANT
COVER PROBE W GEL 5X96 (DRAPES) ×4 IMPLANT
DERMABOND ADVANCED (GAUZE/BANDAGES/DRESSINGS) ×1
DERMABOND ADVANCED .7 DNX12 (GAUZE/BANDAGES/DRESSINGS) ×3 IMPLANT
DEVICE ENSNARE  12MMX20MM (VASCULAR PRODUCTS) ×1
DEVICE ENSNARE 12MMX20MM (VASCULAR PRODUCTS) ×3 IMPLANT
DEVICE INFLATION ENCORE 26 (MISCELLANEOUS) IMPLANT
DEVICE TORQUE H2O (MISCELLANEOUS) ×4 IMPLANT
DEVICE TORQUE KENDALL .025-038 (MISCELLANEOUS) IMPLANT
DRAIN CHANNEL 15F RND FF W/TCR (WOUND CARE) IMPLANT
DRAPE INCISE IOBAN 66X45 STRL (DRAPES) ×12 IMPLANT
DRYSEAL FLEXSHEATH 18FR 33CM (SHEATH)
DRYSEAL FLEXSHEATH 26FR 33CM (SHEATH) ×1
DRYSEAL FLEXSHEATH 26FR 65CM (SHEATH) ×1
ELECT REM PT RETURN 9FT ADLT (ELECTROSURGICAL) ×4
ELECTRODE REM PT RTRN 9FT ADLT (ELECTROSURGICAL) ×3 IMPLANT
EVACUATOR SILICONE 100CC (DRAIN) IMPLANT
FILTER VC CELECT-FEMORAL (Filter) ×4 IMPLANT
FORCEPS RADIAL JAW LRG 4 PULM (INSTRUMENTS) ×3 IMPLANT
GLIDEWIRE ADV .035X260CM (WIRE) IMPLANT
GLOVE SURG ENC MOIS LTX SZ7.5 (GLOVE) ×4 IMPLANT
GLOVE SURG UNDER POLY LF SZ7.5 (GLOVE) ×4 IMPLANT
GOWN STRL REUS W/ TWL LRG LVL3 (GOWN DISPOSABLE) ×6 IMPLANT
GOWN STRL REUS W/ TWL XL LVL3 (GOWN DISPOSABLE) ×3 IMPLANT
GOWN STRL REUS W/TWL LRG LVL3 (GOWN DISPOSABLE) ×2
GOWN STRL REUS W/TWL XL LVL3 (GOWN DISPOSABLE) ×1
GUIDEWIRE ANGLED .035X150CM (WIRE) IMPLANT
GUIDEWIRE ANGLED .035X260CM (WIRE) ×8 IMPLANT
HEMOSTAT SNOW SURGICEL 2X4 (HEMOSTASIS) IMPLANT
KIT BASIN OR (CUSTOM PROCEDURE TRAY) ×4 IMPLANT
KIT DILATOR VASC 18G NDL (KITS) ×4 IMPLANT
KIT ENCORE 26 ADVANTAGE (KITS) ×8 IMPLANT
KIT MICROPUNCTURE NIT STIFF (SHEATH) ×4 IMPLANT
KIT TURNOVER KIT B (KITS) IMPLANT
LUBRICANT VIPERSLIDE CORONARY (MISCELLANEOUS) ×4 IMPLANT
NEEDLE HYPO 25GX1X1/2 BEV (NEEDLE) IMPLANT
NEEDLE SPNL 20GX3.5 QUINCKE YW (NEEDLE) IMPLANT
PACK CIRCUIT ANGIOVAC GEN3 (MISCELLANEOUS) ×3 IMPLANT
PACK ENDO MINOR (CUSTOM PROCEDURE TRAY) ×4 IMPLANT
PACK UNIVERSAL I (CUSTOM PROCEDURE TRAY) ×4 IMPLANT
PAD ARMBOARD 7.5X6 YLW CONV (MISCELLANEOUS) ×8 IMPLANT
POSITIONER HEAD DONUT 9IN (MISCELLANEOUS) ×4 IMPLANT
PUMP SARN DELFIN (MISCELLANEOUS) ×4 IMPLANT
RADIAL JAW LRG 4 PULMONARY (INSTRUMENTS) ×1
SET MICROPUNCTURE 5F STIFF (MISCELLANEOUS) ×4 IMPLANT
SHEATH DESTINATION 8F 45CM (SHEATH) ×4 IMPLANT
SHEATH DRYSEAL FLEX 18FR 33CM (SHEATH) IMPLANT
SHEATH DRYSEAL FLEX 26FR 33CM (SHEATH) ×3 IMPLANT
SHEATH DRYSEAL FLEX 26FR 65CM (SHEATH) ×3 IMPLANT
SHEATH PINNACLE 8F 10CM (SHEATH) ×16 IMPLANT
SHEATH PINNACLE R/O II 6F 4CM (SHEATH) ×4 IMPLANT
SHIELD RADPAD SCOOP 12X17 (MISCELLANEOUS) ×8 IMPLANT
SLEEVE ISOL F/PACE RF HD COVER (MISCELLANEOUS) ×4 IMPLANT
SNARE GOOSENECK 10MM (VASCULAR PRODUCTS) ×4 IMPLANT
STOPCOCK 4 WAY LG BORE MALE ST (IV SETS) IMPLANT
SUT ETHILON 2 0 PSLX (SUTURE) ×16 IMPLANT
SUT ETHILON 3 0 PS 1 (SUTURE) ×20 IMPLANT
SUT PROLENE 6 0 BV (SUTURE) IMPLANT
SUT SILK 2 0 PERMA HAND 18 BK (SUTURE) ×4 IMPLANT
SUT SILK 3 0 (SUTURE)
SUT SILK 3-0 18XBRD TIE 12 (SUTURE) IMPLANT
SUT VIC AB 3-0 SH 27 (SUTURE)
SUT VIC AB 3-0 SH 27X BRD (SUTURE) IMPLANT
SYR 50ML LL SCALE MARK (SYRINGE) ×4 IMPLANT
SYR CONTROL 10ML LL (SYRINGE) IMPLANT
TOWEL GREEN STERILE (TOWEL DISPOSABLE) ×8 IMPLANT
TRAY FOLEY MTR SLVR 16FR STAT (SET/KITS/TRAYS/PACK) IMPLANT
TRAY FOLEY SLVR 14FR TEMP STAT (SET/KITS/TRAYS/PACK) ×4 IMPLANT
WATER STERILE IRR 1000ML POUR (IV SOLUTION) ×4 IMPLANT
WIRE AMPLATZ SS-J .035X180CM (WIRE) ×8 IMPLANT
WIRE AMPLATZ SS-J .035X260CM (WIRE) ×8 IMPLANT
WIRE BENTSON .035X145CM (WIRE) ×4 IMPLANT
WIRE STARTER BENTSON 035X150 (WIRE) ×8 IMPLANT
WIRE TORQFLEX AUST .018X40CM (WIRE) ×4 IMPLANT

## 2020-11-15 NOTE — Anesthesia Preprocedure Evaluation (Addendum)
Anesthesia Evaluation  Patient identified by MRN, date of birth, ID band Patient awake    Reviewed: Allergy & Precautions, NPO status , Patient's Chart, lab work & pertinent test results  History of Anesthesia Complications Negative for: history of anesthetic complications  Airway Mallampati: II  TM Distance: >3 FB Neck ROM: Full    Dental  (+) Dental Advisory Given   Pulmonary former smoker, PE   Pulmonary exam normal        Cardiovascular hypertension, Pt. on medications + DVT  Normal cardiovascular exam     Neuro/Psych negative neurological ROS  negative psych ROS   GI/Hepatic negative GI ROS, Neg liver ROS,   Endo/Other   Hyponatremia   Renal/GU negative Renal ROS     Musculoskeletal negative musculoskeletal ROS (+)   Abdominal   Peds  Hematology  (+) anemia ,  On eliquis    Anesthesia Other Findings   Reproductive/Obstetrics                            Anesthesia Physical Anesthesia Plan  ASA: 3  Anesthesia Plan: General   Post-op Pain Management:    Induction: Intravenous  PONV Risk Score and Plan: 2 and Treatment may vary due to age or medical condition, Ondansetron, Dexamethasone and Midazolam  Airway Management Planned: Oral ETT  Additional Equipment: Arterial line  Intra-op Plan:   Post-operative Plan: Extubation in OR  Informed Consent: I have reviewed the patients History and Physical, chart, labs and discussed the procedure including the risks, benefits and alternatives for the proposed anesthesia with the patient or authorized representative who has indicated his/her understanding and acceptance.     Dental advisory given  Plan Discussed with: CRNA and Anesthesiologist  Anesthesia Plan Comments:        Anesthesia Quick Evaluation

## 2020-11-15 NOTE — Progress Notes (Addendum)
ANTICOAGULATION CONSULT NOTE - Follow-Up  Pharmacy Consult for Heparin (w/ concurrent alteplase for thrombolysis) Indication:  PE and B/L DVT  No Known Allergies  Patient Measurements: Height: 6\' 1"  (185.4 cm) Weight: 108.2 kg (238 lb 9.6 oz) IBW/kg (Calculated) : 79.9 Heparin Dosing Weight: ~TBW  Vital Signs: Temp: 99 F (37.2 C) (07/08 0000) Temp Source: Oral (07/08 0000) BP: 132/83 (07/08 0200) Pulse Rate: 93 (07/08 0200)  Labs: Recent Labs     0000 11/12/20 1340 11/12/20 1711 11/12/20 2303 11/13/20 0338 11/13/20 1258 11/14/20 0429 11/14/20 1642 11/14/20 1718 11/15/20 0226  HGB  --  13.7  --   --  13.1  --  13.2 13.8  --  12.5*  HCT  --  41.3  --   --  38.7*  --  39.3 40.4  --  37.6*  PLT  --  205  --   --  217  --  221 200  --  168  APTT  --  36  --    < > 63*   < > 86*  --  32 146*  LABPROT  --  14.2  --   --   --   --   --   --   --   --   INR  --  1.1  --   --   --   --   --   --   --   --   HEPARINUNFRC   < >  --  >1.10*  --  >1.10*  --  0.89* 0.34  --  0.49  CREATININE  --  1.18  --   --  1.11  --  1.20  --   --  1.03  TROPONINIHS  --   --  40*  --   --   --   --   --   --   --    < > = values in this interval not displayed.    Estimated Creatinine Clearance: 102.1 mL/min (by C-G formula based on SCr of 1.03 mg/dL).  Assessment: 89 YOM with history of DVT, PE, IVC filter on Apixaban who presented on 7/5 with B/L leg pain and was found to have an acute PE and B/L DVTs. Pharmacy was consulted for heparin dosing.   The patient has been on heparin and was therapeutic with an aPTT of 86 on a rate of 2100 units/hr.  S/p thrombolysis 7/7. Now with alteplase running through a catheter and heparin running through a sheath in both the RLE and LLE.   Heparin level 0.49 (therapeutic), PTT 146 sec. Will utilize heparin level from here on out for monitoring as it is more accurate. Hgb down to 12.5, fibrinogen down to 104 (RN to page vascular about this number). RN  noted that IV site is oozing blood but sheath sites look good.  Addendum (0345) Dr. Carlis Abbott is decreasing the alteplase to 0.25mg /hr with low fibrinogen and will recheck labs in 4 hours.  Goal of Therapy: Heparin level 0.2-0.5 units/ml (per Dr. Carlis Abbott consult post-op) - would aim for 0.3-0.5 units/ml with PE Monitor platelets by anticoagulation protocol: Yes  Plan: Continue Heparin in each sheath (LLE + RLE) to 1000 units/hr for a total dose of 2000 units/hr Noted plan back to OR today at 0730 Will f/u 4hr PTT, heparin level, and fibrinogen ordered by Dr. Carlis Abbott  Thank you for allowing pharmacy to be a part of this patient's care.  Sherlon Handing, PharmD, BCPS Please see amion  for complete clinical pharmacist phone list 11/15/2020 3:16 AM

## 2020-11-15 NOTE — Progress Notes (Signed)
ANTICOAGULATION CONSULT NOTE - Follow-Up  Pharmacy Consult for Heparin  Indication:  PE and B/L DVT  No Known Allergies  Patient Measurements: Height: 6\' 1"  (185.4 cm) Weight: 108.2 kg (238 lb 9.6 oz) IBW/kg (Calculated) : 79.9 Heparin Dosing Weight: ~TBW  Vital Signs: Temp: 97 F (36.1 C) (07/08 1441) BP: 126/79 (07/08 1441) Pulse Rate: 63 (07/08 1441)  Labs: Recent Labs    11/12/20 1711 11/12/20 2303 11/13/20 0338 11/13/20 1258 11/14/20 0429 11/14/20 1642 11/14/20 1718 11/15/20 0226 11/15/20 0600  HGB  --    < > 13.1  --  13.2 13.8  --  12.5* 12.9*  HCT  --    < > 38.7*  --  39.3 40.4  --  37.6* 37.4*  PLT  --    < > 217  --  221 200  --  168 154  APTT  --    < > 63*   < > 86*  --  32 146* 106*  HEPARINUNFRC >1.10*  --  >1.10*  --  0.89* 0.34  --  0.49 0.46  CREATININE  --   --  1.11  --  1.20  --   --  1.03  --   TROPONINIHS 40*  --   --   --   --   --   --   --   --    < > = values in this interval not displayed.    Estimated Creatinine Clearance: 102.1 mL/min (by C-G formula based on SCr of 1.03 mg/dL).  Assessment: 20 YOM with history of DVT, PE, IVC filter on Apixaban who presented on 7/5 with B/L leg pain and was found to have an acute PE and B/L DVTs. Pharmacy was consulted for heparin dosing.   The patient has been on heparin and was therapeutic with an aPTT of 86 on a rate of 2100 units/hr. S/p thrombolysis 7/7 and 7/8  alteplase now off   Plan to resume heparin drip - previously therapeutic on heparin drip 2100 uts/hr will resume at that rate   Goal of Therapy: Heparin level 0.3-0.5 units/ml  Monitor platelets by anticoagulation protocol: Yes  Plan: Heparin 2100 uts/hr  Heparin level in 6hr and then daily  Daily CBC monitor for bleeding    Bonnita Nasuti Pharm.D. CPP, BCPS Clinical Pharmacist [phone] 11/15/2020 4:23 PM   Please see amion for complete clinical pharmacist phone list 11/15/2020 4:17 PM

## 2020-11-15 NOTE — Anesthesia Procedure Notes (Signed)
Arterial Line Insertion Start/End7/12/2020 7:40 AM Performed by: Fulton Reek, CRNA, CRNA  Patient location: Pre-op. Preanesthetic checklist: patient identified, IV checked, site marked, risks and benefits discussed, surgical consent, monitors and equipment checked, pre-op evaluation, timeout performed and anesthesia consent Lidocaine 1% used for infiltration Right, radial was placed Catheter size: 20 G Hand hygiene performed  and maximum sterile barriers used   Attempts: 2 Procedure performed without using ultrasound guided technique. Following insertion, dressing applied and Biopatch. Post procedure assessment: normal and unchanged  Patient tolerated the procedure well with no immediate complications.

## 2020-11-15 NOTE — Progress Notes (Signed)
Patient transferred to OR holding via bed on monitor.  Pt placed on monitor and report given to Dellie Catholic, CRNA.

## 2020-11-15 NOTE — Transfer of Care (Signed)
Immediate Anesthesia Transfer of Care Note  Patient: Edgar Olson  Procedure(s) Performed: APPLICATION OF ANGIOVAC (Bilateral: Neck) ULTRASOUND GUIDANCE FOR VASCULAR ACCESS (Bilateral: Neck) INTRAVASCULAR ULTRASOUND (Bilateral: Leg Lower) VENA CAVA FILTER REMOVAL (Right: Groin) INSERTION VENA-CAVA FILTER (Right: Groin) VENOPLASTY VENOGRAM MECHANICAL THROMBECTOMY  Patient Location: PACU  Anesthesia Type:General  Level of Consciousness: awake and alert   Airway & Oxygen Therapy: Patient Spontanous Breathing and Patient connected to face mask oxygen  Post-op Assessment: Report given to RN and Post -op Vital signs reviewed and stable  Post vital signs: Reviewed and stable  Last Vitals:  Vitals Value Taken Time  BP 140/81   Temp    Pulse 66 11/15/20 1337  Resp 16 11/15/20 1337  SpO2 98 % 11/15/20 1337  Vitals shown include unvalidated device data.  Last Pain:  Vitals:   11/15/20 0608  TempSrc:   PainSc: Asleep         Complications: No notable events documented.

## 2020-11-15 NOTE — Progress Notes (Signed)
    Subjective  - POD #1  The patient underwent successful placement of bilateral thrombolytic catheters from the popliteal vein.  He has had no acute overnight events   Physical Exam:  Bilateral leg swelling No significant hematoma from popliteal sheaths Nonlabored breathing      Assessment/Plan:  POD #1  I discussed with the patient that we would come and from the jugular access with the angio VAC device and attempted thrombectomy of the vena cava and iliac veins and possibly the femoral-popliteal venous system.  I discussed the possibility of stenting should it be necessary.  I told him that I am planning on leaving his filter as this was lifesaving in this event and we are not exactly sure the etiology of his underlying hypercoagulable state.  All of his questions were answered and he wishes to proceed.  Edgar Olson 11/15/2020 7:39 AM --  Vitals:   11/15/20 0500 11/15/20 0600  BP: (!) 153/92 (!) 141/91  Pulse: 82 85  Resp: (!) 22 18  Temp:    SpO2: 99% 100%    Intake/Output Summary (Last 24 hours) at 11/15/2020 0739 Last data filed at 11/15/2020 0600 Gross per 24 hour  Intake 2369.92 ml  Output 1600 ml  Net 769.92 ml     Laboratory CBC    Component Value Date/Time   WBC 5.1 11/15/2020 0600   HGB 12.9 (L) 11/15/2020 0600   HCT 37.4 (L) 11/15/2020 0600   PLT 154 11/15/2020 0600    BMET    Component Value Date/Time   NA 130 (L) 11/15/2020 0226   K 4.2 11/15/2020 0226   CL 96 (L) 11/15/2020 0226   CO2 23 11/15/2020 0226   GLUCOSE 101 (H) 11/15/2020 0226   BUN 21 (H) 11/15/2020 0226   CREATININE 1.03 11/15/2020 0226   CALCIUM 8.8 (L) 11/15/2020 0226   GFRNONAA >60 11/15/2020 0226    COAG Lab Results  Component Value Date   INR 1.1 11/12/2020   No results found for: PTT  Antibiotics Anti-infectives (From admission, onward)    Start     Dose/Rate Route Frequency Ordered Stop   11/12/20 2000  [MAR Hold]  amoxicillin-clavulanate (AUGMENTIN)  875-125 MG per tablet 1 tablet        (MAR Hold since Fri 11/15/2020 at 0658.Hold Reason: Transfer to a Procedural area)   1 tablet Oral Every 12 hours 11/12/20 1909          V. Leia Alf, M.D., [facility] [place] Office: [phone] Pager:  [phone]

## 2020-11-15 NOTE — Anesthesia Postprocedure Evaluation (Signed)
Anesthesia Post Note  Patient: Edgar Olson  Procedure(s) Performed: APPLICATION OF ANGIOVAC (Bilateral: Neck) ULTRASOUND GUIDANCE FOR VASCULAR ACCESS (Bilateral: Neck) INTRAVASCULAR ULTRASOUND (Bilateral: Leg Lower) VENA CAVA FILTER REMOVAL (Right: Groin) INSERTION VENA-CAVA FILTER (Right: Groin) VENOPLASTY (Right: Groin) VENOGRAM (Right: Groin) MECHANICAL THROMBECTOMY (Bilateral: Neck)     Patient location during evaluation: PACU Anesthesia Type: General Level of consciousness: awake and alert Pain management: pain level controlled Vital Signs Assessment: post-procedure vital signs reviewed and stable Respiratory status: spontaneous breathing, nonlabored ventilation and respiratory function stable Cardiovascular status: stable and blood pressure returned to baseline Anesthetic complications: no   No notable events documented.  Last Vitals:  Vitals:   11/15/20 1430 11/15/20 1441  BP:  126/79  Pulse: 66 63  Resp: 14 17  Temp:  (!) 36.1 C  SpO2: 97% 100%    Last Pain:  Vitals:   11/15/20 1470  TempSrc:   PainSc: [place]

## 2020-11-15 NOTE — Progress Notes (Signed)
PROGRESS NOTE        PATIENT DETAILS Name: Bland Rudzinski Age: 60 y.o. Sex: male Date of Birth: Aug 23, 1960 Admit Date: 11/12/2020 Admitting Physician Ripudeep Krystal Eaton, MD PCP:Pcp, No  Brief Narrative: Patient is a 60 y.o. 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  Started on anticoagulation, antibiotics-and subsequently admitted to the hospitalist service.  See below for further details.  Significant events: 7/5>> presented [facility]-with bilateral leg pain-found to have DVT/PE/cavitary lung lesion-transferred to [facility]   Significant studies: 7/5>> bilateral lower extremity Doppler: Extensive bilateral DVT 7/5>> Doppler/US aorta/IVC/iliacs: Age indeterminate thrombus involving IVC, right common iliac vein, acute thrombus involving left iliac vein. 7/5>> CTA chest: PE in the distal right pulmonary artery, CT evidence of RV strain, 3.8 x 1.9 cm fluid-filled cavitary lesion in the right lower lobe. 7/6>> CT angio aorta/bifemoral: Acute expansile venous thrombus extending from bilateral popliteal veins to just inferior to the renal vein confluence, indwelling IVC filter is occluded.  Antimicrobial therapy: None  Microbiology data: 7/5>> blood culture: No growth 7/5>> COVID/influenza PCR: Negative  Procedures : 7/7>> 1.  Ultrasound-guided access of left popliteal vein 2.  Left leg venogram and venacavogram 3.  Placement of left lower extremity thrombolytics catheter with initiation of chemical thrombolysis including the left popliteal, femoral, common femoral, iliac, and IVC 4.  Ultrasound-guided access of right small saphenous vein 5.  Placement of right lower extremity thrombolytics catheter with initiation of chemical thrombolysis including the right popliteal, femoral, common femoral, iliac and  IVC  Consults: Vascular surgery, pulmonology  DVT Prophylaxis : IV heparin   Subjective: Lying comfortably in bed  Assessment/Plan: Bilateral DVT/PE with occluded IVC filter: Initially on IV heparin-s/p catheter guided thrombolytic therapy by vascular surgery on 7/7-for angio vac on 7/8.  Await further recommendations from vascular surgery-will likely require indefinite anticoagulation.      Right lower lobe cavitary lesion: Per PCCM-not suspicious for TB-recommendations are to continue Augmentin x3 weeks.  PCCM will arrange for outpatient follow-up.  HTN: BP stable-continue amlodipine.  1.8 cm right adrenal nodule: Radiology recommends CT/MRI in 12 months.  EtOH use: No signs of withdrawal-apparently drinks 5-6 beers every few days.  Watch for signs of withdrawal-on Ativan per CIWA protocol.  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  Continue supportive care.  Obesity: Estimated body mass index is 31.48 kg/m as calculated from the following:   Height as of this encounter: 6\' 1"  (1.854 m).   Weight as of this encounter: 108.2 kg.    Diet: Diet Order             Diet NPO time specified  Diet effective midnight                    Code Status: Full code   Family Communication: None at bedside  Disposition Plan: Status is: Inpatient  Remains inpatient appropriate because:IV treatments appropriate due to intensity of illness or inability to take PO  Dispo: The patient is from: Home              Anticipated d/c is to: Home              Patient currently is not medically  stable to d/c.   Difficult to place patient No   Barriers to Discharge: Extensive bilateral lower extremity DVT-pulmonary embolism-s/p catheter guided thrombolytic therapy-for angio vac later today.  Antimicrobial agents: Anti-infectives (From admission, onward)    Start     Dose/Rate Route Frequency Ordered Stop   11/12/20  2000  [MAR Hold]  amoxicillin-clavulanate (AUGMENTIN) 875-125 MG per tablet 1 tablet        (MAR Hold since Fri 11/15/2020 at 0658.Hold Reason: Transfer to a Procedural area)   1 tablet Oral Every 12 hours 11/12/20 1909          Time spent: 35 minutes-Greater than 50% of this time was spent in counseling, explanation of diagnosis, planning of further management, and coordination of care.  MEDICATIONS: Scheduled Meds:  sodium chloride   Intravenous Once   [MAR Hold] amLODipine  5 mg Oral Daily   [MAR Hold] amoxicillin-clavulanate  1 tablet Oral Q12H   [MAR Hold] Chlorhexidine Gluconate Cloth  6 each Topical Daily   [MAR Hold] cyclobenzaprine  10 mg Oral TID   [MAR Hold] folic acid  1 mg Oral Daily   [MAR Hold] gabapentin  100 mg Oral TID   [MAR Hold] lidocaine  1 patch Transdermal Q24H   [MAR Hold] multivitamin with minerals  1 tablet Oral Daily   [MAR Hold] sodium chloride flush  3 mL Intravenous Q12H   [MAR Hold] thiamine  100 mg Oral Daily   Or   [MAR Hold] thiamine  100 mg Intravenous Daily   Continuous Infusions:  sodium chloride 100 mL/hr at 11/14/20 1023   [MAR Hold] sodium chloride 10 mL/hr at 11/14/20 1445   alteplase (LIMB ISCHEMIA) 10 mg in normal saline (0.02 mg/mL) infusion Stopped (11/15/20 0830)   alteplase (LIMB ISCHEMIA) 10 mg in normal saline (0.02 mg/mL) infusion Stopped (11/15/20 0830)   heparin Stopped (11/15/20 0830)   heparin Stopped (11/15/20 0830)   PRN Meds:.[MAR Hold] sodium chloride, [MAR Hold] acetaminophen **OR** [MAR Hold] acetaminophen, [MAR Hold]  HYDROmorphone (DILAUDID) injection, [MAR Hold] LORazepam **OR** [MAR Hold] LORazepam, [MAR Hold] midazolam, [MAR Hold]  morphine injection, [MAR Hold] ondansetron (ZOFRAN) IV, [MAR Hold] polyethylene glycol, [MAR Hold] sodium chloride flush   PHYSICAL EXAM: Vital signs: Vitals:   11/15/20 0300 11/15/20 0400 11/15/20 0500 11/15/20 0600  BP: (!) 156/97 (!) 155/101 (!) 153/92 (!) 141/91  Pulse: 82 84 82  85  Resp: (!) 24 (!) 25 (!) 22 18  Temp:  98.6 F (37 C)    TempSrc:  Oral    SpO2: 99% 98% 99% 100%  Weight:      Height:       Filed Weights   11/12/20 1151 11/12/20 2240  Weight: 99.8 kg 108.2 kg   Body mass index is 31.48 kg/m.   Gen Exam:Alert awake-not in any distress HEENT:atraumatic, normocephalic Chest: B/L clear to auscultation anteriorly CVS:S1S2 regular Abdomen:soft non tender, non distended Extremities:no edema Neurology: Non focal Skin: no rash   I have personally reviewed following labs and imaging studies  LABORATORY DATA: CBC: Recent Labs  Lab 11/13/20 0338 11/14/20 0429 11/14/20 1642 11/15/20 0226 11/15/20 0600  WBC 6.3 5.7 5.5 5.5 5.1  HGB 13.1 13.2 13.8 12.5* 12.9*  HCT 38.7* 39.3 40.4 37.6* 37.4*  MCV 89.4 89.9 88.4 88.7 88.6  PLT 217 221 200 168 154     Basic Metabolic Panel: Recent Labs  Lab 11/12/20 1340 11/12/20 1839 11/13/20 0338 11/14/20 0429 11/15/20 0226  NA 129*  --  128* 129*  130*  K 4.5  --  3.9 4.0 4.2  CL 94*  --  96* 94* 96*  CO2 25  --  24 27 23   GLUCOSE 100*  --  128* 98 101*  BUN 19  --  17 17 21*  CREATININE 1.18  --  1.11 1.20 1.03  CALCIUM 9.3  --  9.3 9.2 8.8*  MG  --  2.2  --   --   --   PHOS  --  3.3  --   --   --      GFR: Estimated Creatinine Clearance: 102.1 mL/min (by C-G formula based on SCr of 1.03 mg/dL).  Liver Function Tests: Recent Labs  Lab 11/13/20 0338 11/14/20 0429  AST 16 17  ALT 12 13  ALKPHOS 47 47  BILITOT 1.0 0.9  PROT 6.5 6.4*  ALBUMIN 3.3* 3.1*    No results for input(s): LIPASE, AMYLASE in the last 168 hours. No results for input(s): AMMONIA in the last 168 hours.  Coagulation Profile: Recent Labs  Lab 11/12/20 1340  INR 1.1     Cardiac Enzymes: No results for input(s): CKTOTAL, CKMB, CKMBINDEX, TROPONINI in the last 168 hours.  BNP (last 3 results) No results for input(s): PROBNP in the last 8760 hours.  Lipid Profile: No results for input(s): CHOL,  HDL, LDLCALC, TRIG, CHOLHDL, LDLDIRECT in the last 72 hours.  Thyroid Function Tests: Recent Labs    11/13/20 1258  TSH 3.154     Anemia Panel: No results for input(s): VITAMINB12, FOLATE, FERRITIN, TIBC, IRON, RETICCTPCT in the last 72 hours.  Urine analysis: No results found for: COLORURINE, APPEARANCEUR, LABSPEC, PHURINE, GLUCOSEU, HGBUR, BILIRUBINUR, KETONESUR, PROTEINUR, UROBILINOGEN, NITRITE, LEUKOCYTESUR  Sepsis Labs: Lactic Acid, Venous No results found for: LATICACIDVEN  MICROBIOLOGY: Recent Results (from the past 240 hour(s))  Resp Panel by RT-PCR (Flu A&B, Covid) Nasopharyngeal Swab     Status: None   Collection Time: 11/12/20  5:11 PM   Specimen: Nasopharyngeal Swab; Nasopharyngeal(NP) swabs in vial transport medium  Result Value Ref Range Status   SARS Coronavirus 2 by RT PCR NEGATIVE NEGATIVE Final    Comment: (NOTE) SARS-CoV-2 target nucleic acids are NOT DETECTED.  The SARS-CoV-2 RNA is generally detectable in upper respiratory specimens during the acute phase of infection. The lowest concentration of SARS-CoV-2 viral copies this assay can detect is 138 copies/mL. A negative result does not preclude SARS-Cov-2 infection and should not be used as the sole basis for treatment or other patient management decisions. A negative result may occur with  improper specimen collection/handling, submission of specimen other than nasopharyngeal swab, presence of viral mutation(s) within the areas targeted by this assay, and inadequate number of viral copies(<138 copies/mL). A negative result must be combined with clinical observations, patient history, and epidemiological information. The expected result is Negative.  Fact Sheet for Patients:  EntrepreneurPulse.com.au  Fact Sheet for Healthcare Providers:  IncredibleEmployment.be  This test is no t yet approved or cleared by the [place] FDA and  has been authorized for  detection and/or diagnosis of SARS-CoV-2 by FDA under an Emergency Use Authorization (EUA). This EUA will remain  in effect (meaning this test can be used) for the duration of the COVID-19 declaration under Section 564(b)(1) of the Act, 21 U.S.C.section 360bbb-3(b)(1), unless the authorization is terminated  or revoked sooner.       Influenza A by PCR NEGATIVE NEGATIVE Final   Influenza B by PCR NEGATIVE NEGATIVE Final    Comment: (NOTE)  The Xpert Xpress SARS-CoV-2/FLU/RSV plus assay is intended as an aid in the diagnosis of influenza from Nasopharyngeal swab specimens and should not be used as a sole basis for treatment. Nasal washings and aspirates are unacceptable for Xpert Xpress SARS-CoV-2/FLU/RSV testing.  Fact Sheet for Patients: EntrepreneurPulse.com.au  Fact Sheet for Healthcare Providers: IncredibleEmployment.be  This test is not yet approved or cleared by the [place] FDA and has been authorized for detection and/or diagnosis of SARS-CoV-2 by FDA under an Emergency Use Authorization (EUA). This EUA will remain in effect (meaning this test can be used) for the duration of the COVID-19 declaration under Section 564(b)(1) of the Act, 21 U.S.C. section 360bbb-3(b)(1), unless the authorization is terminated or revoked.  Performed at [facility], [place] [address] [place], [place] [address]   Culture, blood (Routine X 2) w Reflex to ID Panel     Status: None (Preliminary result)   Collection Time: 11/12/20  6:25 PM   Specimen: BLOOD  Result Value Ref Range Status   Specimen Description   Final    BLOOD SITE NOT SPECIFIED Performed at [facility] [address], [place], [place] [address]    Special Requests   Final    BOTTLES DRAWN AEROBIC ONLY Blood Culture adequate volume Performed at [facility] [address], [place], [place] [address]    Culture   Final    NO  GROWTH 3 DAYS Performed at [facility], [place] [address], [place], [place] [address]    Report Status PENDING  Incomplete  Culture, blood (Routine X 2) w Reflex to ID Panel     Status: None (Preliminary result)   Collection Time: 11/12/20  6:39 PM   Specimen: BLOOD  Result Value Ref Range Status   Specimen Description   Final    BLOOD RIGHT ANTECUBITAL Performed at [facility] [address], [place], [place] [address]    Special Requests   Final    BOTTLES DRAWN AEROBIC AND ANAEROBIC Blood Culture adequate volume Performed at [facility] [address], [place], [place] [address]    Culture   Final    NO GROWTH 3 DAYS Performed at [facility], [place] [address] [place], [place] [address]    Report Status PENDING  Incomplete    RADIOLOGY STUDIES/RESULTS: PERIPHERAL VASCULAR CATHETERIZATION  Result Date: 11/15/2020 Formatting of this result is different from the original. Patient name: Orbin Mayeux       MRN: [id number]        DOB: Jun 22, 1960          Sex: male   11/14/2020 Pre-operative Diagnosis: Extensive bilateral lower extremity DVTs including iliac veins with IVC occlusion below IVC filter Post-operative diagnosis:  Same Surgeon:  Marty Heck, MD Procedure Performed: 1.  Ultrasound-guided access of left popliteal vein 2.  Left leg venogram and venacavogram 3.  Placement of left lower extremity thrombolytics catheter with initiation of chemical thrombolysis including the left popliteal, femoral, common femoral, iliac, and IVC 4.  Ultrasound-guided access of right small saphenous vein 5.  Placement of right lower extremity thrombolytics catheter with initiation of chemical thrombolysis including the right popliteal, femoral, common femoral, iliac and IVC   Indications: 61 year old male seen in consultation earlier this week with extensive bilateral lower extremity DVTs secondary to an occluded IVC filter including occlusion of  his inferior vena cava and both iliac veins.  He presents today for planned bilateral lower  extremity thrombolytics catheter placement through popliteal approach with plans for angio [facility] tomorrow.  Risks and benefits have been discussed.   Findings: I initially accessed the left small saphenous but could not get the wire to advance.  I then accessed the left popliteal vein and had some difficulty getting the wire to track through the left femoral segment suggesting some chronic component to his thrombus but ultimately was able to get into the iliac vein and above the IVC filter where I confirmed with hand-injection that I was in the IVC.  I then accessed the right small saphenous vein and the wire easily advanced through the iliofemoral segment and into the IVC.  Bilateral lower extremity thrombolytics catheters were placed with the tips just above the IVC filter.             Procedure:  The patient was identified in the holding area and taken to room 8.  Patient was placed prone on the table.  Bilateral popliteal fossa's were then prepped and draped.  A timeout was performed.  Initially evaluated the left small saphenous vein and it appeared compressible and patent and image was saved.  This was accessed with a micro access needle and placed a microwire and I could not get the wire to advance even though I could clearly visualize the wire was in the true lumen.  I then elected to access the left popliteal vein and again this was evaluated ultrasound, appeared patent, and image was saved.  I then accessed this with micro access needle and placed a microwire and then a micro sheath.  I did shoot a left leg venogram given had some resistance getting the wire to advance and this did show what look like an occluded femoral segment in the left leg with collaterals.  I then placed a Glidewire advantage and a short 6 French sheath in the left popliteal vein.  I then used a BER 2 catheter with a Glidewire advantage to cross  the occluded femoral vein up into the iliac vein and IVC.  I got my catheter above the vena cava filter to confirm I was in the true lumen which I was.  The UniFuse catheter was then placed from the left popliteal access up to the IVC filter.  I then evaluated the right small saphenous vein it was patent and image was saved.  I accessed this with micro access needle placed a microwire and then micro sheath and I then advanced the Glidewire advantage up the right small saphenous vein and this advanced easily through the iliofemoral segment into the IVC.  I then upsized to a short 6 French sheath in the right small saphenous vein and advanced my UniFuse catheter over the wire up into the IVC filter.  Both sheaths were secured and TPA will be initiated at 0.5 mg an hour through each UniFuse catheter with tPA.  He remained stable.  Will be transported to ICU overnight.   Plan: We will initiate thrombolysis at 0.5 mg an hour through each UniFuse catheter.  Will initiate heparin at 400 units an hour through each sheath.  Pharmacy will help titrate heparin.     Marty Heck, MD [facility] Office: [phone]  ECHOCARDIOGRAM COMPLETE  Result Date: 11/13/2020    ECHOCARDIOGRAM REPORT   Patient Name:   MANLY NESTLE Date of Exam: 11/13/2020 Medical Rec #:  [id number]       Height:       73.0 in Accession #:  [id number]      Weight:       238.6 lb Date of Birth:  June 06, 1960       BSA:          2.319 m Patient Age:    54 years        BP:           133/87 mmHg Patient Gender: M               HR:           90 bpm. Exam Location:  Inpatient Procedure: 2D Echo, Cardiac Doppler and Color Doppler Indications:    Pulmonary Embolus I26.09  History:        Patient has no prior history of Echocardiogram examinations.                 Risk Factors:Hypertension. DVT.  Sonographer:    Vickie Epley RDCS Referring Phys: [id number] RIPUDEEP K RAI IMPRESSIONS  1. Left ventricular ejection fraction, by  estimation, is 50 to 55%. The left ventricle has low normal function. The left ventricle demonstrates regional wall motion abnormalities (see scoring diagram/findings for description). Left ventricular diastolic  parameters were normal. There is mild hypokinesis of the left ventricular, basal inferoseptal wall, inferior wall and inferolateral wall.  2. Right ventricular systolic function is normal. The right ventricular size is normal. There is normal pulmonary artery systolic pressure.  3. The mitral valve is normal in structure. Trivial mitral valve regurgitation. No evidence of mitral stenosis.  4. The aortic valve was not well visualized. There is mild calcification of the aortic valve. Aortic valve regurgitation is trivial.  5. The inferior vena cava is normal in size with greater than 50% respiratory variability, suggesting right atrial pressure of 3 mmHg. Comparison(s): No prior Echocardiogram. Conclusion(s)/Recommendation(s): Mild abnormal wall motion in basal inferoseptum/inferior/inferolateral walls with low normal EF. RV incompletely visualized but appears grossly normal. FINDINGS  Left Ventricle: Left ventricular ejection fraction, by estimation, is 50 to 55%. The left ventricle has low normal function. The left ventricle demonstrates regional wall motion abnormalities. Mild hypokinesis of the left ventricular, basal inferoseptal  wall, inferior wall and inferolateral wall. The left ventricular internal cavity size was normal in size. There is no left ventricular hypertrophy. Left ventricular diastolic parameters were normal. Right Ventricle: The right ventricular size is normal. Right vetricular wall thickness was not well visualized. Right ventricular systolic function is normal. There is normal pulmonary artery systolic pressure. The tricuspid regurgitant velocity is 1.67 m/s, and with an assumed right atrial pressure of 3 mmHg, the estimated right ventricular systolic pressure is 40.8 mmHg. Left  Atrium: Left atrial size was normal in size. Right Atrium: Right atrial size was normal in size. Pericardium: There is no evidence of pericardial effusion. Mitral Valve: The mitral valve is normal in structure. Trivial mitral valve regurgitation. No evidence of mitral valve stenosis. Tricuspid Valve: The tricuspid valve is normal in structure. Tricuspid valve regurgitation is trivial. No evidence of tricuspid stenosis. Aortic Valve: The aortic valve was not well visualized. There is mild calcification of the aortic valve. Aortic valve regurgitation is trivial. Pulmonic Valve: The pulmonic valve was not well visualized. Pulmonic valve regurgitation is not visualized. Aorta: The aortic root, ascending aorta, aortic arch and descending aorta are all structurally normal, with no evidence of dilitation or obstruction. Venous: The inferior vena cava is normal in size with greater than 50% respiratory variability, suggesting right atrial pressure of 3 mmHg. IAS/Shunts: The interatrial septum  was not well visualized.  LEFT VENTRICLE PLAX 2D LVIDd:         5.40 cm      Diastology LVIDs:         4.10 cm      LV e' medial:    8.27 cm/s LV PW:         1.00 cm      LV E/e' medial:  5.2 LV IVS:        1.00 cm      LV e' lateral:   10.00 cm/s LVOT diam:     2.20 cm      LV E/e' lateral: 4.3 LV SV:         60 LV SV Index:   26 LVOT Area:     3.80 cm  LV Volumes (MOD) LV vol d, MOD A2C: 128.0 ml LV vol d, MOD A4C: 117.0 ml LV vol s, MOD A2C: 58.7 ml LV vol s, MOD A4C: 53.8 ml LV SV MOD A2C:     69.3 ml LV SV MOD A4C:     117.0 ml LV SV MOD BP:      68.1 ml RIGHT VENTRICLE RV S prime:     19.40 cm/s TAPSE (M-mode): 2.4 cm LEFT ATRIUM             Index       RIGHT ATRIUM           Index LA diam:        3.70 cm 1.60 cm/m  RA Area:     14.90 cm LA Vol (A2C):   39.1 ml 16.84 ml/m RA Volume:   39.00 ml  16.82 ml/m LA Vol (A4C):   25.9 ml 11.17 ml/m LA Biplane Vol: 36.2 ml 15.61 ml/m  AORTIC VALVE LVOT Vmax:   84.20 cm/s LVOT Vmean:   57.600 cm/s LVOT VTI:    0.158 m  AORTA Ao Root diam: 3.70 cm Ao Asc diam:  3.70 cm Ao Desc diam: 2.70 cm MITRAL VALVE               TRICUSPID VALVE MV Area (PHT): 2.50 cm    TR Peak grad:   11.2 mmHg MV Decel Time: 303 msec    TR Vmax:        167.00 cm/s MV E velocity: 43.30 cm/s MV A velocity: 69.80 cm/s  SHUNTS MV E/A ratio:  0.62        Systemic VTI:  0.16 m                            Systemic Diam: 2.20 cm Buford Dresser MD Electronically signed by Buford Dresser MD Signature Date/Time: 11/13/2020/6:18:19 PM    Final    HYBRID OR IMAGING ([place])  Result Date: 11/15/2020 There is no interpretation for this exam.  This order is for images obtained during a surgical procedure.  Please See "Surgeries" Tab for more information regarding the procedure.     LOS: 3 days   Oren Binet, MD  [facility]    To contact the attending provider between 7A-7P or the covering provider during after hours 7P-7A, please log into the web site [email or web address] and access using universal [place] password for that web site. If you do not have the password, please call the hospital operator.  11/15/2020, 1:41 PM

## 2020-11-15 NOTE — Progress Notes (Signed)
   11/15/20 1600  Clinical Encounter Type  Visited With Patient  Visit Type Spiritual support;Social support  Referral From Nurse  Consult/Referral To Chaplain  Spiritual Encounters  Spiritual Needs Prayer;Emotional  The chaplain responded to spiritual consult for prayer. The chaplain spoke with the patient. He was under a warming blanket to regulate his body temperature and resting. The patient requested that I pray for a speedy recovery. The chaplain prayed a prayer of healing and recovery. The chaplain will follow up as needed.

## 2020-11-15 NOTE — Progress Notes (Signed)
ANTICOAGULATION CONSULT NOTE - Follow Up Consult  Pharmacy Consult for heparin Indication:  PE/DVT, now s/p lysis  Labs: Recent Labs    11/13/20 0338 11/13/20 1258 11/14/20 0429 11/14/20 1642 11/14/20 1718 11/15/20 0226 11/15/20 0600 11/15/20 2100  HGB 13.1  --  13.2 13.8  --  12.5* 12.9*  --   HCT 38.7*  --  39.3 40.4  --  37.6* 37.4*  --   PLT 217  --  221 200  --  168 154  --   APTT 63*   < > 86*  --  32 146* 106*  --   HEPARINUNFRC >1.10*  --  0.89* 0.34  --  0.49 0.46 0.67  CREATININE 1.11  --  1.20  --   --  1.03  --   --    < > = values in this interval not displayed.    Assessment/Plan:  60yo male therapeutic on heparin after resumed. Will continue gtt at current rate of 2100 units/hr and confirm stable with am labs.   Wynona Neat, PharmD, BCPS  11/15/2020,11:03 PM

## 2020-11-15 NOTE — Anesthesia Procedure Notes (Signed)
Procedure Name: Intubation Date/Time: 11/15/2020 8:09 AM Performed by: Fulton Reek, CRNA Pre-anesthesia Checklist: Patient identified, Emergency Drugs available, Suction available and Patient being monitored Patient Re-evaluated:Patient Re-evaluated prior to induction Oxygen Delivery Method: Circle System Utilized Preoxygenation: Pre-oxygenation with 100% oxygen Induction Type: IV induction Ventilation: Mask ventilation without difficulty Laryngoscope Size: Mac and 4 Grade View: Grade I Tube type: Oral Tube size: 7.5 mm Number of attempts: 1 Airway Equipment and Method: Stylet and Oral airway Placement Confirmation: ETT inserted through vocal cords under direct vision, positive ETCO2 and breath sounds checked- equal and bilateral Secured at: 23 cm Tube secured with: Tape Dental Injury: Teeth and Oropharynx as per pre-operative assessment

## 2020-11-15 NOTE — Op Note (Signed)
Patient name: Edgar Olson MRN: [id number] DOB: 04/10/1961 Sex: male  11/15/2020 Pre-operative Diagnosis: Inferior vena cava occlusion Post-operative diagnosis:  Same Surgeon:  Annamarie Major  Procedure:   #1: Ultrasound-guided access of the right internal jugular vein, left internal jugular vein x2 separate cannulation sites, right common femoral vein   #2: Ileo-caval venogram   #3: Venoplasty of the bilateral femoral, common femoral, external iliac, and common iliac veins #4: Mechanical thrombectomy of bilateral common iliac, external iliac, common femoral, femoral veins   #5: Intravascular ultrasound of bilateral popliteal, femoral, common femoral, external iliac, common iliac veins and inferior vena cava  #6: Removal of inferior vena cava filter  #7: Placement of inferior vena cava filter   #8: Venovenous bypass x87 minutes  Anesthesia: General Blood Loss: 200 cc Specimens: None  Findings: Chronic thrombus was visualized in both lower extremities.  I was able to remove all the thrombus within the iliac veins and inferior vena cava filter.  Despite multiple attempts, I was not able to clean out the filter in its entirety, and so it was removed.  Ended up placing a new Chief of Staff.  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  He underwent thrombolysis beginning yesterday.  He is in today for additional intervention.  Procedure:  The patient was identified in the holding area and taken to [facility]  The patient was then placed supine on the table. general anesthesia was administered.  The patient was prepped and draped in the usual sterile fashion.  A time out was called and antibiotics were administered.  I first began by replacing the popliteal sheaths with 8 French sheaths over Amplatz wires.  Next, ultrasound-guided access was obtained in bilateral internal jugular veins.  This was done initially with a  micropuncture needle under ultrasound guidance and then upsized to 8 [place] sheaths.  The patient was fully heparinized.  ACT measurements were maintained greater than 300 for the procedure.  Next, IVUS was used to evaluate bilateral popliteal, femoral, common femoral, external iliac, and common iliac veins as well as inferior vena cava.  This showed chronic appearing thrombus within the femoral-popliteal region.  There was more fresh thrombus within the ileal caval section however this had cleaned up nicely with lytic therapy.  There was thrombus within the proximal filter.  Next over a Amplatz wire, a 26 French Gore dry seal sheath was inserted down to the level of the filter.  A 16 French venous cannula was placed into the left internal jugular vein.  The system was connected and venovenous bypass was initiated.  The angio VAC was positioned over top of the filter.  I then obtain ultrasound-guided access into the right common femoral vein and placed an 8 French sheath where I used the cleaner wire into the right iliac system and inferior vena cava.  I then inserted a cleaner wire from both popliteal cannulation sites and perform thrombectomy using the cleaner wire which was passed through the bilateral femoral, common femoral, external iliac, and common iliac veins as well as inferior vena cava.  I spent a lot of time with the cleaner wire in the filter.  I then reevaluated the interventions with IVUS.  There appeared to still be some thrombus within the filter however the iliac veins appeared clear as well as the common femoral veins.  Next I inserted a 10 x 8 balloon and performed balloon  venoplasty of the bilateral femoral, common femoral, external iliac, and common iliac veins tried to push the thrombus into the AngioJet.  I positioned the angio vac into the right common iliac vein and left common iliac vein with the corresponding leg was being addressed.  IVUS was then used to reevaluate the lower  extremities.  There did appear to be a flow channel, however most of what we were visualizing was chronic thrombus.  At this point, I did not feel that additional treatment of the leg veins would be beneficial.  I could not get the remaining thrombus out of the filter as evidenced by a inferior venacavogram.  Therefore I felt the filter needed to be removed.  I did not want to come off of venous bypass graft and give out the angio [facility] when trying to take the filter out.  Therefore, I cannulated the left internal jugular vein proximal to the venous cannula under ultrasound guidance.  Ultimately a 8 French sheath was inserted.  I then tried to snare the filter with a ensnare as well as a gooseneck snare.  The hook of the filter was embedded into the vena cava wall.  I tried taking a balloon from below to push it off the wall.  I also used a reverse curve catheter and a snare to try to mobilize it however none of these maneuvers were successful.  I ultimately elected to give up the venovenous bypass and angio vac device for better access.  These were discontinued and blood was returned to the patient.  That they continue to try to get the filter out without success.  I used bronchoscopy forceps and again this was unsuccessful.  Ultimately, the legs of the filter were bent and and trying to straighten them out I was able to free the hook of the filter from the wall and then captured the filter with a 26 French sheath.  The filter was removed in its entirety.  All legs were intact.  There was chronic thrombus at the top of the filter.  Next, I performed a venogram from the groin to make sure there was no extravasation and to locate the renal veins.  I then placed a [employer] filter just below the renal veins.  This is in good position.  At this point I was satisfied with the results.  I did not reverse the heparin.  All sheaths were removed and 2-0 nylon with a red rubber catheter as a bolster was used to close the  cannulation sites.  The legs were wrapped with Kerlix, Ace wrap and Coban.  The patient was successfully extubated taken recovery in stable condition.   Disposition:  To PACU Stable   V. Annamarie Major, M.D., [facility] [place] Office: [phone] Pager:  [phone]

## 2020-11-16 LAB — CBC
HCT: 27 % — ABNORMAL LOW (ref 39.0–52.0)
Hemoglobin: 9.4 g/dL — ABNORMAL LOW (ref 13.0–17.0)
MCH: 30.8 pg (ref 26.0–34.0)
MCHC: 34.8 g/dL (ref 30.0–36.0)
MCV: 88.5 fL (ref 80.0–100.0)
Platelets: 168 10*3/uL (ref 150–400)
RBC: 3.05 MIL/uL — ABNORMAL LOW (ref 4.22–5.81)
RDW: 13.3 % (ref 11.5–15.5)
WBC: 6.3 10*3/uL (ref 4.0–10.5)
nRBC: 0 % (ref 0.0–0.2)

## 2020-11-16 LAB — BASIC METABOLIC PANEL
Anion gap: 7 (ref 5–15)
BUN: 15 mg/dL (ref 6–20)
CO2: 24 mmol/L (ref 22–32)
Calcium: 7.9 mg/dL — ABNORMAL LOW (ref 8.9–10.3)
Chloride: 102 mmol/L (ref 98–111)
Creatinine, Ser: 0.98 mg/dL (ref 0.61–1.24)
GFR, Estimated: >60 mL/min (ref >60)
Glucose, Bld: 145 mg/dL — ABNORMAL HIGH (ref 70–99)
Potassium: 4.6 mmol/L (ref 3.5–5.1)
Sodium: 133 mmol/L — ABNORMAL LOW (ref 135–145)

## 2020-11-16 LAB — HEPARIN LEVEL (UNFRACTIONATED): Heparin Unfractionated: 0.67 IU/mL (ref 0.30–0.70)

## 2020-11-16 MED ORDER — RIVAROXABAN 20 MG PO TABS: 20.0000 mg | ORAL_TABLET | Freq: Every day | ORAL | Status: DC

## 2020-11-16 MED ORDER — RIVAROXABAN 15 MG PO TABS
15.0000 mg | ORAL_TABLET | Freq: Two times a day (BID) | ORAL | Status: DC
  Administered 2020-11-16 – 2020-11-17 (×3): 15 mg via ORAL
  Filled 2020-11-16 (×3): qty 1

## 2020-11-16 NOTE — Progress Notes (Signed)
PROGRESS NOTE        PATIENT DETAILS Name: Edgar Olson Age: 60 y.o. Sex: male Date of Birth: 04-Apr-1961 Admit Date: 11/12/2020 Admitting Physician Ripudeep Krystal Eaton, MD PCP:Pcp, No  Brief Narrative: Patient is a 60 y.o. 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  Started on anticoagulation, antibiotics-and subsequently admitted to the hospitalist service.  See below for further details.  Significant events: 7/5>> presented [facility]-with bilateral leg pain-found to have DVT/PE/cavitary lung lesion-transferred to [facility]   Significant studies: 7/5>> bilateral lower extremity Doppler: Extensive bilateral DVT 7/5>> Doppler/US aorta/IVC/iliacs: Age indeterminate thrombus involving IVC, right common iliac vein, acute thrombus involving left iliac vein. 7/5>> CTA chest: PE in the distal right pulmonary artery, CT evidence of RV strain, 3.8 x 1.9 cm fluid-filled cavitary lesion in the right lower lobe. 7/6>> CT angio aorta/bifemoral: Acute expansile venous thrombus extending from bilateral popliteal veins to just inferior to the renal vein confluence, indwelling IVC filter is occluded.  Antimicrobial therapy: None  Microbiology data: 7/5>> blood culture: No growth 7/5>> COVID/influenza PCR: Negative  Procedures : 7/7>> 1.  Ultrasound-guided access of left popliteal vein 2.  Left leg venogram and venacavogram 3.  Placement of left lower extremity thrombolytics catheter with initiation of chemical thrombolysis including the left popliteal, femoral, common femoral, iliac, and IVC 4.  Ultrasound-guided access of right small saphenous vein 5.  Placement of right lower extremity thrombolytics catheter with initiation of chemical thrombolysis including  the right popliteal, femoral, common femoral, iliac and IVC  7/8>>  #1: Ultrasound-guided access of the right internal jugular vein, left internal jugular vein x2 separate cannulation sites, right common femoral vein #2: Ileo-caval venogram #3: Venoplasty of the bilateral femoral, common femoral, external iliac, and common iliac veins #4: Mechanical thrombectomy of bilateral common iliac, external iliac, common femoral, femoral veins       #5: Intravascular ultrasound of bilateral popliteal, femoral, common femoral, external iliac, common iliac veins and inferior vena cava #6: Removal of inferior vena cava filter #7: Placement of inferior vena cava filter  #8: Venovenous bypass x87 minutes   Consults: Vascular surgery, pulmonology  DVT Prophylaxis : IV heparin Rivaroxaban (XARELTO) tablet 15 mg  rivaroxaban (XARELTO) tablet 20 mg   Subjective: Lying comfortably in bed-no major issues overnight.  Assessment/Plan: Bilateral DVT/PE with occluded IVC filter: Initially on IV heparin-s/p catheter guided thrombolytic therapy by vascular surgery on 7/7-subsequently underwent mechanical thrombectomy on 7/8-we will switch to Xarelto today.  Patient prefers Xarelto due to once daily dosing.  Case management consulted-regarding assistance-suspect this episode of VTE mostly due to poor compliance due to financial issues (taking Eliquis once a day-and missing up to 7 days a month)  Right lower lobe cavitary lesion: Per PCCM-not suspicious for TB-recommendations are to continue Augmentin x3 weeks.  PCCM will arrange for outpatient follow-up.  HTN: BP stable-continue amlodipine.  1.8 cm right adrenal nodule: Radiology recommends CT/MRI in 12 months.  EtOH use: No signs of withdrawal-apparently drinks 5-6 beers every few days.  Watch for signs of withdrawal-on Ativan per CIWA protocol.  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   Continue supportive care.  Obesity: Estimated body mass index is 31.48 kg/m as calculated from the following:   Height as of this encounter: 6\' 1"  (1.854 m).   Weight as of this encounter: 108.2 kg.    Diet: Diet Order             Diet Heart Room service appropriate? Yes; Fluid consistency: Thin  Diet effective now                    Code Status: Full code   Family Communication: None at bedside  Disposition Plan: Status is: Inpatient  Remains inpatient appropriate because:IV treatments appropriate due to intensity of illness or inability to take PO  Dispo: The patient is from: Home              Anticipated d/c is to: Home              Patient currently is not medically stable to d/c.   Difficult to place patient No   Barriers to Discharge: Extensive bilateral lower extremity DVT-pulmonary embolism-s/p catheter guided thrombolytic therapy-s/p mechanical thrombectomy-switching to oral anticoagulation today-consulting case management for medication assistance.  Antimicrobial agents: Anti-infectives (From admission, onward)    Start     Dose/Rate Route Frequency Ordered Stop   11/12/20 2000  amoxicillin-clavulanate (AUGMENTIN) 875-125 MG per tablet 1 tablet        1 tablet Oral Every 12 hours 11/12/20 1909          Time spent: 25 minutes-Greater than 50% of this time was spent in counseling, explanation of diagnosis, planning of further management, and coordination of care.  MEDICATIONS: Scheduled Meds:  sodium chloride   Intravenous Once   amLODipine  5 mg Oral Daily   amoxicillin-clavulanate  1 tablet Oral Q12H   Chlorhexidine Gluconate Cloth  6 each Topical Daily   cyclobenzaprine  10 mg Oral TID   folic acid  1 mg Oral Daily   gabapentin  100 mg Oral TID   lidocaine  1 patch Transdermal Q24H   multivitamin with minerals  1 tablet Oral Daily   Rivaroxaban  15 mg Oral BID   Followed by   Derrill Memo ON 12/07/2020] rivaroxaban  20 mg Oral Q supper   sodium  chloride flush  3 mL Intravenous Q12H   thiamine  100 mg Oral Daily   Continuous Infusions:  sodium chloride Stopped (11/16/20 1010)   sodium chloride 10 mL/hr at 11/14/20 1445   heparin 2,100 Units/hr (11/16/20 1300)   PRN Meds:.sodium chloride, acetaminophen **OR** acetaminophen, HYDROmorphone (DILAUDID) injection, midazolam, morphine injection, ondansetron (ZOFRAN) IV, polyethylene glycol, polyvinyl alcohol, sodium chloride flush   PHYSICAL EXAM: Vital signs: Vitals:   11/16/20 0800 11/16/20 1006 11/16/20 1134 11/16/20 1200  BP: 137/81 (!) 143/82  116/65  Pulse: 89   91  Resp: (!) 24   20  Temp: 98.24 F (36.8 C)  98.6 F (37 C) 98.6 F (37 C)  TempSrc:      SpO2: 94%   92%  Weight:      Height:       Filed Weights   11/12/20 1151 11/12/20 2240  Weight: 99.8 kg 108.2 kg   Body mass index is 31.48 kg/m.   Gen Exam:Alert awake-not in any distress HEENT:atraumatic, normocephalic Chest: B/L clear to auscultation anteriorly CVS:S1S2 regular Abdomen:soft non tender, non distended Extremities:no edema Neurology: Non focal Skin: no rash   I have personally reviewed following labs and imaging studies  LABORATORY DATA: CBC: Recent Labs  Lab  11/14/20 0429 11/14/20 1642 11/15/20 0226 11/15/20 0600 11/16/20 0433  WBC 5.7 5.5 5.5 5.1 6.3  HGB 13.2 13.8 12.5* 12.9* 9.4*  HCT 39.3 40.4 37.6* 37.4* 27.0*  MCV 89.9 88.4 88.7 88.6 88.5  PLT 221 200 168 154 168     Basic Metabolic Panel: Recent Labs  Lab 11/12/20 1340 11/12/20 1839 11/13/20 0338 11/14/20 0429 11/15/20 0226 11/16/20 0433  NA 129*  --  128* 129* 130* 133*  K 4.5  --  3.9 4.0 4.2 4.6  CL 94*  --  96* 94* 96* 102  CO2 25  --  24 27 23 24   GLUCOSE 100*  --  128* 98 101* 145*  BUN 19  --  17 17 21* 15  CREATININE 1.18  --  1.11 1.20 1.03 0.98  CALCIUM 9.3  --  9.3 9.2 8.8* 7.9*  MG  --  2.2  --   --   --   --   PHOS  --  3.3  --   --   --   --      GFR: Estimated Creatinine Clearance:  107.3 mL/min (by C-G formula based on SCr of 0.98 mg/dL).  Liver Function Tests: Recent Labs  Lab 11/13/20 0338 11/14/20 0429  AST 16 17  ALT 12 13  ALKPHOS 47 47  BILITOT 1.0 0.9  PROT 6.5 6.4*  ALBUMIN 3.3* 3.1*    No results for input(s): LIPASE, AMYLASE in the last 168 hours. No results for input(s): AMMONIA in the last 168 hours.  Coagulation Profile: Recent Labs  Lab 11/12/20 1340  INR 1.1     Cardiac Enzymes: No results for input(s): CKTOTAL, CKMB, CKMBINDEX, TROPONINI in the last 168 hours.  BNP (last 3 results) No results for input(s): PROBNP in the last 8760 hours.  Lipid Profile: No results for input(s): CHOL, HDL, LDLCALC, TRIG, CHOLHDL, LDLDIRECT in the last 72 hours.  Thyroid Function Tests: No results for input(s): TSH, T4TOTAL, FREET4, T3FREE, THYROIDAB in the last 72 hours.   Anemia Panel: No results for input(s): VITAMINB12, FOLATE, FERRITIN, TIBC, IRON, RETICCTPCT in the last 72 hours.  Urine analysis: No results found for: COLORURINE, APPEARANCEUR, LABSPEC, PHURINE, GLUCOSEU, HGBUR, BILIRUBINUR, KETONESUR, PROTEINUR, UROBILINOGEN, NITRITE, LEUKOCYTESUR  Sepsis Labs: Lactic Acid, Venous No results found for: LATICACIDVEN  MICROBIOLOGY: Recent Results (from the past 240 hour(s))  Resp Panel by RT-PCR (Flu A&B, Covid) Nasopharyngeal Swab     Status: None   Collection Time: 11/12/20  5:11 PM   Specimen: Nasopharyngeal Swab; Nasopharyngeal(NP) swabs in vial transport medium  Result Value Ref Range Status   SARS Coronavirus 2 by RT PCR NEGATIVE NEGATIVE Final    Comment: (NOTE) SARS-CoV-2 target nucleic acids are NOT DETECTED.  The SARS-CoV-2 RNA is generally detectable in upper respiratory specimens during the acute phase of infection. The lowest concentration of SARS-CoV-2 viral copies this assay can detect is 138 copies/mL. A negative result does not preclude SARS-Cov-2 infection and should not be used as the sole basis for treatment  or other patient management decisions. A negative result may occur with  improper specimen collection/handling, submission of specimen other than nasopharyngeal swab, presence of viral mutation(s) within the areas targeted by this assay, and inadequate number of viral copies(<138 copies/mL). A negative result must be combined with clinical observations, patient history, and epidemiological information. The expected result is Negative.  Fact Sheet for Patients:  EntrepreneurPulse.com.au  Fact Sheet for Healthcare Providers:  IncredibleEmployment.be  This test is no t yet  approved or cleared by the [place] and  has been authorized for detection and/or diagnosis of SARS-CoV-2 by FDA under an Emergency Use Authorization (EUA). This EUA will remain  in effect (meaning this test can be used) for the duration of the COVID-19 declaration under Section 564(b)(1) of the Act, 21 U.S.C.section 360bbb-3(b)(1), unless the authorization is terminated  or revoked sooner.       Influenza A by PCR NEGATIVE NEGATIVE Final   Influenza B by PCR NEGATIVE NEGATIVE Final    Comment: (NOTE) The Xpert Xpress SARS-CoV-2/FLU/RSV plus assay is intended as an aid in the diagnosis of influenza from Nasopharyngeal swab specimens and should not be used as a sole basis for treatment. Nasal washings and aspirates are unacceptable for Xpert Xpress SARS-CoV-2/FLU/RSV testing.  Fact Sheet for Patients: EntrepreneurPulse.com.au  Fact Sheet for Healthcare Providers: IncredibleEmployment.be  This test is not yet approved or cleared by the [place] FDA and has been authorized for detection and/or diagnosis of SARS-CoV-2 by FDA under an Emergency Use Authorization (EUA). This EUA will remain in effect (meaning this test can be used) for the duration of the COVID-19 declaration under Section 564(b)(1) of the Act, 21 U.S.C. section  360bbb-3(b)(1), unless the authorization is terminated or revoked.  Performed at [facility], [place] [address], [place], [place] [address]   Culture, blood (Routine X 2) w Reflex to ID Panel     Status: None (Preliminary result)   Collection Time: 11/12/20  6:25 PM   Specimen: BLOOD  Result Value Ref Range Status   Specimen Description   Final    BLOOD SITE NOT SPECIFIED Performed at [place] [address] [place], [place] [address]    Special Requests   Final    BOTTLES DRAWN AEROBIC ONLY Blood Culture adequate volume Performed at [facility] [address], [place], [place] [address]    Culture   Final    NO GROWTH 4 DAYS Performed at [facility], [place] [address] [place], [place] [address]    Report Status PENDING  Incomplete  Culture, blood (Routine X 2) w Reflex to ID Panel     Status: None (Preliminary result)   Collection Time: 11/12/20  6:39 PM   Specimen: BLOOD  Result Value Ref Range Status   Specimen Description   Final    BLOOD RIGHT ANTECUBITAL Performed at [facility] [address], [place], [place] [address]    Special Requests   Final    BOTTLES DRAWN AEROBIC AND ANAEROBIC Blood Culture adequate volume Performed at [facility] [address] [place], [place] [address]    Culture   Final    NO GROWTH 4 DAYS Performed at [facility], [place] [address], [place], [place] [address]    Report Status PENDING  Incomplete    RADIOLOGY STUDIES/RESULTS: HYBRID OR IMAGING ([place])  Result Date: 11/15/2020 There is no interpretation for this exam.  This order is for images obtained during a surgical procedure.  Please See "Surgeries" Tab for more information regarding the procedure.     LOS: 4 days   Oren Binet, MD  [facility]    To contact the attending provider between 7A-7P or the covering provider during after hours 7P-7A,  please log into the web site [email or web address] and access using universal [place] password for that web site. If you do not have the password, please call the hospital operator.  11/16/2020, 2:34 PM

## 2020-11-16 NOTE — Progress Notes (Signed)
[facility] [place] PROGRESS NOTE  ASSESSMENT / PLAN: Edgar Olson is a 60 y.o. male status post iliocaval thrombecomy, iliofemoral angioplasty, and IVC filter removal and replacement for IVC occlusion.  Ready for discharge. Prior to discharge we will remove the bolster dressings from venous access sites. Patient should continue Eliquis. He should follow up with [facility] in the next 2-3 weeks.  SUBJECTIVE: No complaints. Looks great. Wants to go home.  OBJECTIVE: BP (!) 143/82   Pulse 89   Temp 98.24 F (36.8 C)   Resp (!) 24   Ht 6\' 1"  (1.854 m)   Wt 108.2 kg   SpO2 94%   BMI 31.48 kg/m   Intake/Output Summary (Last 24 hours) at 11/16/2020 1017 Last data filed at 11/16/2020 0800 Gross per 24 hour  Intake 1938.46 ml  Output 2670 ml  Net -731.54 ml    No distress RRR Unlabored Access points clean and dry  CBC Latest Ref Rng & Units 11/16/2020 11/15/2020 11/15/2020  WBC 4.0 - 10.5 K/uL 6.3 5.1 5.5  Hemoglobin 13.0 - 17.0 g/dL 9.4(L) 12.9(L) 12.5(L)  Hematocrit 39.0 - 52.0 % 27.0(L) 37.4(L) 37.6(L)  Platelets 150 - 400 K/uL 168 154 168     CMP Latest Ref Rng & Units 11/16/2020 11/15/2020 11/14/2020  Glucose 70 - 99 mg/dL 145(H) 101(H) 98  BUN 6 - 20 mg/dL 15 21(H) 17  Creatinine 0.61 - 1.24 mg/dL 0.98 1.03 1.20  Sodium 135 - 145 mmol/L 133(L) 130(L) 129(L)  Potassium 3.5 - 5.1 mmol/L 4.6 4.2 4.0  Chloride 98 - 111 mmol/L 102 96(L) 94(L)  CO2 22 - 32 mmol/L 24 23 27   Calcium 8.9 - 10.3 mg/dL 7.9(L) 8.8(L) 9.2  Total Protein 6.5 - 8.1 g/dL - - 6.4(L)  Total Bilirubin 0.3 - 1.2 mg/dL - - 0.9  Alkaline Phos 38 - 126 U/L - - 47  AST 15 - 41 U/L - - 17  ALT 0 - 44 U/L - - 13    Estimated Creatinine Clearance: 107.3 mL/min (by C-G formula based on SCr of 0.98 mg/dL).  [name]. [name], MD [facility] Phone Number: [phone] 11/16/2020 10:17 AM

## 2020-11-16 NOTE — TOC Initial Note (Addendum)
Transition of Care [facility]) - Initial/Assessment Note    Patient Details  Name: Edgar Olson MRN: [id number] Date of Birth: 04-02-1961  Transition of Care [facility]) CM/SW Contact:    Carles Collet, RN Phone Number: 11/16/2020, 4:11 PM  Clinical Narrative:       Damaris Schooner w patient at bedside. He states that he moved here a week and half ago with his son from [facility] for them both to start jobs at the [employer] complex in [place]. The patient was to start his job on Monday, he states he has notified them of his hospitalization.  He states that his insurance will begin 90 days after start of work. He confirms that he is uninsured, has been for several years. He confirms that he has a driver's license and a car. Confirmed address as listed in Epic, and cell phone [phone] by calling him from office.   He is admitted w BLE DVT and PE requiring DOAC. He has been on Xaralto and Eliquis in the past and used the 30 day free card for both, as well as had assistance from the hospital in [facility] for several months but never started a patient assistance application for either. I spoke w [facility] supervisor on Olga Coaster RN, who approved a MATCH override for DOAC due to his extenuating circumstances and life threatening condition. He does not have a PCP. I spoke w the IM resident, Dr Court Joy, who was willing to add the patient to their clinic for follow up. He will arrange for their scheduler to call him early this coming week to make a follow up appointment. He will be provided with patient assistance forms for Jennye Moccasin and will need to follow up on these.                Expected Discharge Plan: Home/Self Care Barriers to Discharge: Inadequate or no insurance, Continued Medical Work up   Patient Goals and CMS Choice Patient states their goals for this hospitalization and ongoing recovery are:: to return home   Choice offered to / list presented to : NA  Expected Discharge Plan and Services Expected  Discharge Plan: Home/Self Care   Discharge Planning Services: CM Consult, [place] Program, Medication Assistance, Follow-up appt scheduled                                          Prior Living Arrangements/Services                       Activities of Daily Living Home Assistive Devices/Equipment: None ADL Screening (condition at time of admission) Patient's cognitive ability adequate to safely complete daily activities?: No Is the patient deaf or have difficulty hearing?: No Does the patient have difficulty seeing, even when wearing glasses/contacts?: No Does the patient have difficulty concentrating, remembering, or making decisions?: No Patient able to express need for assistance with ADLs?: Yes Does the patient have difficulty dressing or bathing?: Yes Independently performs ADLs?: Yes (appropriate for developmental age) Does the patient have difficulty walking or climbing stairs?: Yes Weakness of Legs: Both Weakness of Arms/Hands: None  Permission Sought/Granted                  Emotional Assessment              Admission diagnosis:  Acute pulmonary embolism ([place]) [I26.99] S/P IVC filter [Z95.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 [place] [address] Phone: [phone] Fax: [phone]     Social Determinants of Health (SDOH) Interventions    Readmission Risk Interventions No flowsheet data found.

## 2020-11-16 NOTE — Progress Notes (Signed)
ANTICOAGULATION CONSULT NOTE - Follow-Up  Pharmacy Consult for Heparin  Indication:  PE and B/L DVT  No Known Allergies  Patient Measurements: Height: 6\' 1"  (185.4 cm) Weight: 108.2 kg (238 lb 9.6 oz) IBW/kg (Calculated) : 79.9 Heparin Dosing Weight: ~TBW  Vital Signs: Temp: 98.6 F (37 C) (07/09 1200) Temp Source: Bladder (07/09 0400) BP: 116/65 (07/09 1200) Pulse Rate: 91 (07/09 1200)  Labs: Recent Labs    11/14/20 0429 11/14/20 1642 11/14/20 1718 11/15/20 0226 11/15/20 0600 11/15/20 2100 11/16/20 0433  HGB 13.2   < >  --  12.5* 12.9*  --  9.4*  HCT 39.3   < >  --  37.6* 37.4*  --  27.0*  PLT 221   < >  --  168 154  --  168  APTT 86*  --  32 146* 106*  --   --   HEPARINUNFRC 0.89*   < >  --  0.49 0.46 0.67 0.67  CREATININE 1.20  --   --  1.03  --   --  0.98   < > = values in this interval not displayed.    Estimated Creatinine Clearance: 107.3 mL/min (by C-G formula based on SCr of 0.98 mg/dL).  Assessment: 89 YOM with history of DVT, PE, IVC filter on Apixaban who presented on 7/5 with B/L leg pain and was found to have an acute PE and B/L DVTs. Pharmacy was consulted for heparin dosing.   The patient has been on heparin and was therapeutic with an aPTT of 86 on a rate of 2100 units/hr. S/p thrombolysis 7/7 and 7/8  alteplase now off   heparin drip 2100 uts/hr heparin level at goal 0.67 Hgb lower but no bleeding noted Discussed with MD will transition to rivaroxaban and make DC plans   Goal of Therapy: Heparin level 0.3-0.5 units/ml  Monitor platelets by anticoagulation protocol: Yes  Plan: Heparin 2100 uts/hr - stop  Rivaroxaban 125mg  BID x21 days then 20mg  daily Daily CBC monitor for bleeding    Bonnita Nasuti Pharm.D. CPP, BCPS Clinical Pharmacist [phone] 11/16/2020 2:05 PM   Please see amion for complete clinical pharmacist phone list 11/16/2020 2:05 PM

## 2020-11-17 LAB — BASIC METABOLIC PANEL
Anion gap: 6 (ref 5–15)
BUN: 17 mg/dL (ref 6–20)
CO2: 26 mmol/L (ref 22–32)
Calcium: 8.1 mg/dL — ABNORMAL LOW (ref 8.9–10.3)
Chloride: 102 mmol/L (ref 98–111)
Creatinine, Ser: 1 mg/dL (ref 0.61–1.24)
GFR, Estimated: >60 mL/min (ref >60)
Glucose, Bld: 104 mg/dL — ABNORMAL HIGH (ref 70–99)
Potassium: 3.9 mmol/L (ref 3.5–5.1)
Sodium: 134 mmol/L — ABNORMAL LOW (ref 135–145)

## 2020-11-17 LAB — CBC
HCT: 24.1 % — ABNORMAL LOW (ref 39.0–52.0)
Hemoglobin: 8 g/dL — ABNORMAL LOW (ref 13.0–17.0)
MCH: 30.2 pg (ref 26.0–34.0)
MCHC: 33.2 g/dL (ref 30.0–36.0)
MCV: 90.9 fL (ref 80.0–100.0)
Platelets: 222 10*3/uL (ref 150–400)
RBC: 2.65 MIL/uL — ABNORMAL LOW (ref 4.22–5.81)
RDW: 13.7 % (ref 11.5–15.5)
WBC: 6.5 10*3/uL (ref 4.0–10.5)
nRBC: 0 % (ref 0.0–0.2)

## 2020-11-17 LAB — CULTURE, BLOOD (ROUTINE X 2)
Culture: NO GROWTH
Culture: NO GROWTH
Special Requests: ADEQUATE
Special Requests: ADEQUATE

## 2020-11-17 MED ORDER — APIXABAN 5 MG PO TABS: 5.0000 mg | ORAL_TABLET | Freq: Two times a day (BID) | ORAL | 11 refills | Status: DC

## 2020-11-17 MED ORDER — APIXABAN 5 MG PO TABS: 5.0000 mg | ORAL_TABLET | Freq: Two times a day (BID) | ORAL | Status: DC

## 2020-11-17 MED ORDER — APIXABAN 5 MG PO TABS
10.0000 mg | ORAL_TABLET | Freq: Two times a day (BID) | ORAL | Status: DC
  Administered 2020-11-17 – 2020-11-18 (×2): 10 mg via ORAL
  Filled 2020-11-17 (×2): qty 2

## 2020-11-17 NOTE — Progress Notes (Addendum)
PROGRESS NOTE        PATIENT DETAILS Name: Edgar Olson Age: 60 y.o. Sex: male Date of Birth: [birth year] Admit Date: 11/12/2020 Admitting Physician Ripudeep Krystal Eaton, MD PCP:Pcp, No  Brief Narrative: Patient is a 60 y.o. 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  Started on anticoagulation, antibiotics-and subsequently admitted to the hospitalist service.  See below for further details.  Significant events: 7/5>> presented [facility]-with bilateral leg pain-found to have DVT/PE/cavitary lung lesion-transferred to [facility]   Significant studies: 7/5>> bilateral lower extremity Doppler: Extensive bilateral DVT 7/5>> Doppler/US aorta/IVC/iliacs: Age indeterminate thrombus involving IVC, right common iliac vein, acute thrombus involving left iliac vein. 7/5>> CTA chest: PE in the distal right pulmonary artery, CT evidence of RV strain, 3.8 x 1.9 cm fluid-filled cavitary lesion in the right lower lobe. 7/6>> CT angio aorta/bifemoral: Acute expansile venous thrombus extending from bilateral popliteal veins to just inferior to the renal vein confluence, indwelling IVC filter is occluded.  Antimicrobial therapy: None  Microbiology data: 7/5>> blood culture: No growth 7/5>> COVID/influenza PCR: Negative  Procedures : 7/7>> 1.  Ultrasound-guided access of left popliteal vein 2.  Left leg venogram and venacavogram 3.  Placement of left lower extremity thrombolytics catheter with initiation of chemical thrombolysis including the left popliteal, femoral, common femoral, iliac, and IVC 4.  Ultrasound-guided access of right small saphenous vein 5.  Placement of right lower extremity thrombolytics catheter with initiation of chemical thrombolysis including  the right popliteal, femoral, common femoral, iliac and IVC  7/8>>  #1: Ultrasound-guided access of the right internal jugular vein, left internal jugular vein x2 separate cannulation sites, right common femoral vein #2: Ileo-caval venogram #3: Venoplasty of the bilateral femoral, common femoral, external iliac, and common iliac veins #4: Mechanical thrombectomy of bilateral common iliac, external iliac, common femoral, femoral veins       #5: Intravascular ultrasound of bilateral popliteal, femoral, common femoral, external iliac, common iliac veins and inferior vena cava #6: Removal of inferior vena cava filter #7: Placement of inferior vena cava filter  #8: Venovenous bypass x87 minutes   Consults: Vascular surgery, pulmonology  DVT Prophylaxis : IV heparin apixaban (ELIQUIS) tablet 10 mg  apixaban (ELIQUIS) tablet 5 mg   Subjective: Lying comfortably in bed-no major issues overnight.  Assessment/Plan: Bilateral DVT/PE with occluded IVC filter: Initially on IV heparin-s/p catheter guided thrombolytic therapy by vascular surgery on 7/7-subsequently underwent mechanical thrombectomy on 7/8-initially switched to Xarelto-but given issues with arranging outpatient anticoagulation therapy (lack of insurance etc.) we will switch back to Eliquis today (he has around 20 or days of Eliquis remaining at home).  Case management working on getting him more supplies of Eliquis-he had significant clot burden-and needs arrangement of anticoagulation-outpatient follow-up before considering discharge.  His DVT this time is clearly due to compliance issues-financial reasons (taking Eliquis once a day-and skipping doses for approximately 7 days a month)-I do not think this is an Eliquis failure.  Right lower lobe cavitary lesion: Per PCCM-not suspicious for TB-recommendations are to continue Augmentin x3 weeks.  PCCM will arrange for outpatient follow-up.  Anemia: Suspect this is due to acute  illness-blood loss during surgery-stable for follow-up.  Asymptomatic currently.  HTN: BP stable-continue amlodipine.  1.8 cm right adrenal nodule: Radiology recommends CT/MRI in 12 months.  EtOH use: No signs of withdrawal-apparently drinks 5-6 beers every few days.  Watch for signs of withdrawal-on Ativan per CIWA protocol.  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  Continue supportive care.  Obesity: Estimated body mass index is 31.48 kg/m as calculated from the following:   Height as of this encounter: 6\' 1"  (1.854 m).   Weight as of this encounter: 108.2 kg.    Diet: Diet Order             Diet Heart Room service appropriate? Yes; Fluid consistency: Thin  Diet effective now                    Code Status: Full code   Family Communication: None at bedside  Disposition Plan: Status is: Inpatient  Remains inpatient appropriate because:IV treatments appropriate due to intensity of illness or inability to take PO  Dispo: The patient is from: Home              Anticipated d/c is to: Home              Patient currently is not medically stable to d/c.   Difficult to place patient No   Barriers to Discharge: Extensive bilateral lower extremity DVT-pulmonary embolism-s/p catheter guided thrombolytic therapy-s/p mechanical thrombectomy-given large clot burden-needs appropriate outpatient follow-up-arrangement of anticoagulation before consideration of discharge.  Likely discharge on 7/11 so that outpatient follow-up/anticoagulation arrangements can be made-once things open up on Monday  Antimicrobial agents: Anti-infectives (From admission, onward)    Start     Dose/Rate Route Frequency Ordered Stop   11/12/20 2000  amoxicillin-clavulanate (AUGMENTIN) 875-125 MG per tablet 1 tablet        1 tablet Oral Every 12 hours 11/12/20 1909          Time spent: 25 minutes-Greater than 50% of this time was  spent in counseling, explanation of diagnosis, planning of further management, and coordination of care.  MEDICATIONS: Scheduled Meds:  sodium chloride   Intravenous Once   amLODipine  5 mg Oral Daily   amoxicillin-clavulanate  1 tablet Oral Q12H   apixaban  10 mg Oral BID   Followed by   Derrill Memo ON 11/24/2020] apixaban  5 mg Oral BID   Chlorhexidine Gluconate Cloth  6 each Topical Daily   cyclobenzaprine  10 mg Oral TID   folic acid  1 mg Oral Daily   gabapentin  100 mg Oral TID   lidocaine  1 patch Transdermal Q24H   multivitamin with minerals  1 tablet Oral Daily   sodium chloride flush  3 mL Intravenous Q12H   thiamine  100 mg Oral Daily   Continuous Infusions:  sodium chloride Stopped (11/16/20 1010)   sodium chloride 10 mL/hr at 11/14/20 1445   PRN Meds:.sodium chloride, acetaminophen **OR** acetaminophen, HYDROmorphone (DILAUDID) injection, midazolam, morphine injection, ondansetron (ZOFRAN) IV, polyethylene glycol, polyvinyl alcohol, sodium chloride flush   PHYSICAL EXAM: Vital signs: Vitals:   11/17/20 0600 11/17/20 0739 11/17/20 0800 11/17/20 1148  BP: 124/73  138/90   Pulse: 65  73   Resp: 20  (!) 21   Temp:  97.6 F (36.4 C)  97.8 F (36.6 C)  TempSrc:      SpO2: 98%  99%   Weight:      Height:       [employer]  11/12/20 1151 11/12/20 2240  Weight: 99.8 kg 108.2 kg   Body mass index is 31.48 kg/m.   Gen Exam:Alert awake-not in any distress HEENT:atraumatic, normocephalic Chest: B/L clear to auscultation anteriorly CVS:S1S2 regular Abdomen:soft non tender, non distended Extremities:no edema Neurology: Non focal Skin: no rash   I have personally reviewed following labs and imaging studies  LABORATORY DATA: CBC: Recent Labs  Lab 11/14/20 1642 11/15/20 0226 11/15/20 0600 11/16/20 0433 11/17/20 0101  WBC 5.5 5.5 5.1 6.3 6.5  HGB 13.8 12.5* 12.9* 9.4* 8.0*  HCT 40.4 37.6* 37.4* 27.0* 24.1*  MCV 88.4 88.7 88.6 88.5 90.9  PLT 200 168 154  168 222     Basic Metabolic Panel: Recent Labs  Lab 11/12/20 1839 11/13/20 0338 11/14/20 0429 11/15/20 0226 11/16/20 0433 11/17/20 0101  NA  --  128* 129* 130* 133* 134*  K  --  3.9 4.0 4.2 4.6 3.9  CL  --  96* 94* 96* 102 102  CO2  --  24 27 23 24 26   GLUCOSE  --  128* 98 101* 145* 104*  BUN  --  17 17 21* 15 17  CREATININE  --  1.11 1.20 1.03 0.98 1.00  CALCIUM  --  9.3 9.2 8.8* 7.9* 8.1*  MG 2.2  --   --   --   --   --   PHOS 3.3  --   --   --   --   --      GFR: Estimated Creatinine Clearance: 105.1 mL/min (by C-G formula based on SCr of 1 mg/dL).  Liver Function Tests: Recent Labs  Lab 11/13/20 0338 11/14/20 0429  AST 16 17  ALT 12 13  ALKPHOS 47 47  BILITOT 1.0 0.9  PROT 6.5 6.4*  ALBUMIN 3.3* 3.1*    No results for input(s): LIPASE, AMYLASE in the last 168 hours. No results for input(s): AMMONIA in the last 168 hours.  Coagulation Profile: Recent Labs  Lab 11/12/20 1340  INR 1.1     Cardiac Enzymes: No results for input(s): CKTOTAL, CKMB, CKMBINDEX, TROPONINI in the last 168 hours.  BNP (last 3 results) No results for input(s): PROBNP in the last 8760 hours.  Lipid Profile: No results for input(s): CHOL, HDL, LDLCALC, TRIG, CHOLHDL, LDLDIRECT in the last 72 hours.  Thyroid Function Tests: No results for input(s): TSH, T4TOTAL, FREET4, T3FREE, THYROIDAB in the last 72 hours.   Anemia Panel: No results for input(s): VITAMINB12, FOLATE, FERRITIN, TIBC, IRON, RETICCTPCT in the last 72 hours.  Urine analysis: No results found for: COLORURINE, APPEARANCEUR, LABSPEC, PHURINE, GLUCOSEU, HGBUR, BILIRUBINUR, KETONESUR, PROTEINUR, UROBILINOGEN, NITRITE, LEUKOCYTESUR  Sepsis Labs: Lactic Acid, Venous No results found for: LATICACIDVEN  MICROBIOLOGY: Recent Results (from the past 240 hour(s))  Resp Panel by RT-PCR (Flu A&B, Covid) Nasopharyngeal Swab     Status: None   Collection Time: 11/12/20  5:11 PM   Specimen: Nasopharyngeal Swab;  Nasopharyngeal(NP) swabs in vial transport medium  Result Value Ref Range Status   SARS Coronavirus 2 by RT PCR NEGATIVE NEGATIVE Final    Comment: (NOTE) SARS-CoV-2 target nucleic acids are NOT DETECTED.  The SARS-CoV-2 RNA is generally detectable in upper respiratory specimens during the acute phase of infection. The lowest concentration of SARS-CoV-2 viral copies this assay can detect is 138 copies/mL. A negative result does not preclude SARS-Cov-2 infection and should not be used as the sole basis for treatment or other patient management decisions. A negative result may occur with  improper  specimen collection/handling, submission of specimen other than nasopharyngeal swab, presence of viral mutation(s) within the areas targeted by this assay, and inadequate number of viral copies(<138 copies/mL). A negative result must be combined with clinical observations, patient history, and epidemiological information. The expected result is Negative.  Fact Sheet for Patients:  EntrepreneurPulse.com.au  Fact Sheet for Healthcare Providers:  IncredibleEmployment.be  This test is no t yet approved or cleared by the [place] FDA and  has been authorized for detection and/or diagnosis of SARS-CoV-2 by FDA under an Emergency Use Authorization (EUA). This EUA will remain  in effect (meaning this test can be used) for the duration of the COVID-19 declaration under Section 564(b)(1) of the Act, 21 U.S.C.section 360bbb-3(b)(1), unless the authorization is terminated  or revoked sooner.       Influenza A by PCR NEGATIVE NEGATIVE Final   Influenza B by PCR NEGATIVE NEGATIVE Final    Comment: (NOTE) The Xpert Xpress SARS-CoV-2/FLU/RSV plus assay is intended as an aid in the diagnosis of influenza from Nasopharyngeal swab specimens and should not be used as a sole basis for treatment. Nasal washings and aspirates are unacceptable for Xpert Xpress  SARS-CoV-2/FLU/RSV testing.  Fact Sheet for Patients: EntrepreneurPulse.com.au  Fact Sheet for Healthcare Providers: IncredibleEmployment.be  This test is not yet approved or cleared by the [place] FDA and has been authorized for detection and/or diagnosis of SARS-CoV-2 by FDA under an Emergency Use Authorization (EUA). This EUA will remain in effect (meaning this test can be used) for the duration of the COVID-19 declaration under Section 564(b)(1) of the Act, 21 U.S.C. section 360bbb-3(b)(1), unless the authorization is terminated or revoked.  Performed at [facility], [place] [address] [place], [place] [address]   Culture, blood (Routine X 2) w Reflex to ID Panel     Status: None   Collection Time: 11/12/20  6:25 PM   Specimen: BLOOD  Result Value Ref Range Status   Specimen Description   Final    BLOOD SITE NOT SPECIFIED Performed at [facility] [address] [place], [place] [address]    Special Requests   Final    BOTTLES DRAWN AEROBIC ONLY Blood Culture adequate volume Performed at [facility] [address] [place], [place] [address]    Culture   Final    NO GROWTH 5 DAYS Performed at [facility], [place] [address], [place], [place] [address]    Report Status 11/17/2020 FINAL  Final  Culture, blood (Routine X 2) w Reflex to ID Panel     Status: None   Collection Time: 11/12/20  6:39 PM   Specimen: BLOOD  Result Value Ref Range Status   Specimen Description   Final    BLOOD RIGHT ANTECUBITAL Performed at [facility] [address], [place], [place] [address]    Special Requests   Final    BOTTLES DRAWN AEROBIC AND ANAEROBIC Blood Culture adequate volume Performed at [facility] [address], [place], [place] [address]    Culture   Final    NO GROWTH 5 DAYS Performed at [facility], [place] [address]  [place],  [address]    Report Status 11/17/2020 FINAL  Final    RADIOLOGY STUDIES/RESULTS: No results found.   LOS: 5 days   Oren Binet, MD  [facility]    To contact the attending provider between 7A-7P or the covering provider during after hours 7P-7A, please  log into the web site [email or web address] and access using universal [place] password for that web site. If you do not have the password, please call the hospital operator.  11/17/2020, 12:11 PM

## 2020-11-17 NOTE — Discharge Instructions (Addendum)
  Information on my medicine - ELIQUIS (apixaban)  This medication education was reviewed with me or my healthcare representative as part of my discharge preparation.    Why was Eliquis prescribed for you? Eliquis was prescribed to treat blood clots that may have been found in the veins of your legs (deep vein thrombosis) or in your lungs (pulmonary embolism) and to reduce the risk of them occurring again.  What do You need to know about Eliquis ? The starting dose is 10 mg (two 5 mg tablets) taken TWICE daily for the FIRST SEVEN (7) DAYS, then on 11/24/20  the dose is reduced to ONE 5 mg tablet taken TWICE daily.  Eliquis may be taken with or without food.   Try to take the dose about the same time in the morning and in the evening. If you have difficulty swallowing the tablet whole please discuss with your pharmacist how to take the medication safely.  Take Eliquis exactly as prescribed and DO NOT stop taking Eliquis without talking to the doctor who prescribed the medication.  Stopping may increase your risk of developing a new blood clot.  Refill your prescription before you run out.  After discharge, you should have regular check-up appointments with your healthcare provider that is prescribing your Eliquis.    What do you do if you miss a dose? If a dose of ELIQUIS is not taken at the scheduled time, take it as soon as possible on the same day and twice-daily administration should be resumed. The dose should not be doubled to make up for a missed dose.  Important Safety Information A possible side effect of Eliquis is bleeding. You should call your healthcare provider right away if you experience any of the following: Bleeding from an injury or your nose that does not stop. Unusual colored urine (red or dark brown) or unusual colored stools (red or black). Unusual bruising for unknown reasons. A serious fall or if you hit your head (even if there is no bleeding).  Some  medicines may interact with Eliquis and might increase your risk of bleeding or clotting while on Eliquis. To help avoid this, consult your healthcare provider or pharmacist prior to using any new prescription or non-prescription medications, including herbals, vitamins, non-steroidal anti-inflammatory drugs (NSAIDs) and supplements.  This website has more information on Eliquis (apixaban): http://www.eliquis.com/eliquis/home

## 2020-11-17 NOTE — TOC Progression Note (Signed)
Transition of Care [facility]) - Progression Note    Patient Details  Name: Edgar Olson MRN: [id number] Date of Birth: Sep 27, 1960  [facility]) CM/SW Contact  Carles Collet, RN Phone Number: 11/17/2020, 11:09 AM  Clinical Narrative:    Edgar Olson to patient at bedside re plan for medications.  TOC pharmacy will fill through [facility] w override for DOAC. Eliquis patient assistance forms filled out w attending and patient and have been faxed. Originals left in chart CM sent patient via text the link to [facility] and reviewed how to use app to find low cost scripts. We discussed out of pocket cost for DOACS.  He then showed me that he has a bottle of 5mg  Eliquis with 54 (personally counted) tablets.  Shared this info w attending who will DC now on Eliquis. Will enter Eliquis override for [facility] pharmacy.     Expected Discharge Plan: Home/Self Care Barriers to Discharge: Inadequate or no insurance, Continued Medical Work up  Expected Discharge Plan and Services Expected Discharge Plan: Home/Self Care   Discharge Planning Services: CM Consult, [place] Program, Medication Assistance, Follow-up appt scheduled                                           Social Determinants of Health (SDOH) Interventions    Readmission Risk Interventions No flowsheet data found.

## 2020-11-17 NOTE — Progress Notes (Signed)
Patient to 4E14 from [place]. Vital signs obtained. On monitor CCMD notified. Alert and oriented to room and call light. Call bell within reach. Era Bumpers, RN

## 2020-11-18 ENCOUNTER — Encounter (HOSPITAL_COMMUNITY): Payer: Self-pay | Admitting: Surgery

## 2020-11-18 ENCOUNTER — Other Ambulatory Visit (HOSPITAL_COMMUNITY): Payer: Self-pay

## 2020-11-18 LAB — BASIC METABOLIC PANEL
Anion gap: 6 (ref 5–15)
BUN: 14 mg/dL (ref 6–20)
CO2: 26 mmol/L (ref 22–32)
Calcium: 8.5 mg/dL — ABNORMAL LOW (ref 8.9–10.3)
Chloride: 103 mmol/L (ref 98–111)
Creatinine, Ser: 0.93 mg/dL (ref 0.61–1.24)
GFR, Estimated: >60 mL/min (ref >60)
Glucose, Bld: 92 mg/dL (ref 70–99)
Potassium: 4.2 mmol/L (ref 3.5–5.1)
Sodium: 135 mmol/L (ref 135–145)

## 2020-11-18 LAB — CBC
HCT: 25 % — ABNORMAL LOW (ref 39.0–52.0)
Hemoglobin: 8.1 g/dL — ABNORMAL LOW (ref 13.0–17.0)
MCH: 29.8 pg (ref 26.0–34.0)
MCHC: 32.4 g/dL (ref 30.0–36.0)
MCV: 91.9 fL (ref 80.0–100.0)
Platelets: 238 10*3/uL (ref 150–400)
RBC: 2.72 MIL/uL — ABNORMAL LOW (ref 4.22–5.81)
RDW: 14 % (ref 11.5–15.5)
WBC: 5.7 10*3/uL (ref 4.0–10.5)
nRBC: 0 % (ref 0.0–0.2)

## 2020-11-18 MED ORDER — GABAPENTIN 100 MG PO CAPS
100.0000 mg | ORAL_CAPSULE | Freq: Three times a day (TID) | ORAL | 1 refills | Status: DC
Start: 2020-11-18 — End: 2022-12-13
  Filled 2020-11-18: qty 90, 30d supply, fill #0

## 2020-11-18 MED ORDER — APIXABAN (ELIQUIS) VTE STARTER PACK (10MG AND 5MG)
ORAL_TABLET | ORAL | 0 refills | Status: DC
  Filled 2020-11-18: qty 74, 30d supply, fill #0

## 2020-11-18 MED ORDER — CYCLOBENZAPRINE HCL 10 MG PO TABS
10.0000 mg | ORAL_TABLET | Freq: Three times a day (TID) | ORAL | 0 refills | Status: DC | PRN
  Filled 2020-11-18: qty 30, 10d supply, fill #0

## 2020-11-18 MED ORDER — AMLODIPINE BESYLATE 5 MG PO TABS
5.0000 mg | ORAL_TABLET | Freq: Every day | ORAL | 1 refills | Status: DC
  Filled 2020-11-18: qty 30, 30d supply, fill #0

## 2020-11-18 MED ORDER — AMOXICILLIN-POT CLAVULANATE 875-125 MG PO TABS
1.0000 | ORAL_TABLET | Freq: Two times a day (BID) | ORAL | 0 refills | Status: AC
  Filled 2020-11-18: qty 31, 16d supply, fill #0

## 2020-11-18 NOTE — Discharge Summary (Addendum)
PATIENT DETAILS Name: Cassius Cullinane Age: 60 y.o. Sex: male Date of Birth: 1961/04/02 MRN: [id number]. Admitting Physician: Mendel Corning, MD PCP:Pcp, No  Admit Date: 11/12/2020 Discharge date: 11/18/2020  Recommendations for Outpatient Follow-up:  Follow up with PCP in 1-2 weeks Please obtain CMP/CBC in one week Please ensure follow-up with vascular surgery 1.8 cm adrenal nodule-please repeat CT/MRI abdomen and 1 year-as recommended by radiology  Admitted From:  Home  Disposition: [place]: No  Equipment/Devices: None  Discharge Condition: Stable  CODE STATUS: FULL CODE  Diet recommendation:  Diet Order             Diet - low sodium heart healthy           Diet Heart Room service appropriate? Yes; Fluid consistency: Thin  Diet effective now                    Brief Summary: Patient is a 60 y.o. 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  Started on anticoagulation, antibiotics-and subsequently admitted to the hospitalist service.  See below for further details.   Significant events: 7/5>> presented [facility]-with bilateral leg pain-found to have DVT/PE/cavitary lung lesion-transferred to [facility]   Significant studies: 7/5>> bilateral lower extremity Doppler: Extensive bilateral DVT 7/5>> Doppler/US aorta/IVC/iliacs: Age indeterminate thrombus involving IVC, right common iliac vein, acute thrombus involving left iliac vein. 7/5>> CTA chest: PE in the distal right pulmonary artery, CT evidence of RV strain, 3.8 x 1.9 cm fluid-filled cavitary lesion in the right lower lobe. 7/6>> CT angio aorta/bifemoral: Acute expansile venous thrombus extending from bilateral popliteal veins to just inferior to the renal vein confluence,  indwelling IVC filter is occluded. 7/6>> Echo: EF 41-66%, RV systolic function is normal,+ wall motion abnormalities   Antimicrobial therapy: None   Microbiology data: 7/5>> blood culture: No growth 7/5>> COVID/influenza PCR: Negative   Procedures : 7/7>> 1.  Ultrasound-guided access of left popliteal vein 2.  Left leg venogram and venacavogram 3.  Placement of left lower extremity thrombolytics catheter with initiation of chemical thrombolysis including the left popliteal, femoral, common femoral, iliac, and IVC 4.  Ultrasound-guided access of right small saphenous vein 5.  Placement of right lower extremity thrombolytics catheter with initiation of chemical thrombolysis including the right popliteal, femoral, common femoral, iliac and IVC   7/8>>  #1: Ultrasound-guided access of the right internal jugular vein, left internal jugular vein x2 separate cannulation sites, right common femoral vein #2: Ileo-caval venogram #3: Venoplasty of the bilateral femoral, common femoral, external iliac, and common iliac veins #4: Mechanical thrombectomy of bilateral common iliac, external iliac, common femoral, femoral veins       #5: Intravascular ultrasound of bilateral popliteal, femoral, common femoral, external iliac, common iliac veins and inferior vena cava #6: Removal of inferior vena cava filter #7: Placement of inferior vena cava filter  #8: Venovenous bypass x87 minutes   Consults: Vascular surgery, pulmonology  Brief Hospital Course: Bilateral DVT/PE with occluded IVC filter: Initially on IV heparin-s/p catheter guided thrombolytic therapy by vascular surgery on 7/7-subsequently underwent mechanical thrombectomy on 7/8-initially switched to Xarelto-but given issues with arranging outpatient anticoagulation therapy (lack of insurance etc.) we will switch back to Eliquis today (he has around 20 or days of Eliquis remaining at home).  Case management will manage him for another month  supply.  Case management also has filled out patient assistant program-and is hopeful that patient will get supply of Eliquis in the future.  Patient aware that if he is unable to secure a supply of Eliquis-he needs to contact his primary care MD and get switched over to warfarin.  Patient will follow with vascular surgery as well.  As noted in prior notes-VTE-this admission was due to compliance issues-for financial reasons-he was taking Eliquis only once a day-and at times skipping up to 7 days a month.  Do not think this is Eliquis failure.   Right lower lobe cavitary lesion: Per PCCM-not suspicious for TB-recommendations are to continue Augmentin x3 weeks.  PCCM will arrange for outpatient follow-up.  Wall motion abnormality seen on echocardiogram: Asymptomatic-please see echo findings-Case briefly discussed over the phone with cardiologist-Dr. Mayer Masker reviewed echo-no major wall motion issues-stable for further work-up to be done in the outpatient setting.    Anemia: Suspect this is due to acute illness-blood loss during surgery-stable for follow-up.  Asymptomatic currently.   HTN: BP stable-continue amlodipine.   1.8 cm right adrenal nodule: Radiology recommends CT/MRI in 12 months.   EtOH use: No signs of withdrawal-apparently drinks 5-6 beers every few days.  Watch for signs of withdrawal-on Ativan per CIWA protocol.   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  Continue supportive care.   Obesity: Estimated body mass index is 31.48 kg/m as calculated from the following:   Height as of this encounter: 6\' 1"  (1.854 m).   Weight as of this encounter: 108.2 kg.    Discharge Diagnoses:  Principal Problem:   Acute pulmonary embolism ([place]) Active Problems:   Bilateral leg DVT (deep venous thrombosis) ([place])   Essential hypertension   Discharge Instructions:  Activity:  As tolerated   Discharge Instructions      Diet - low sodium heart healthy   Complete by: As directed    Discharge instructions   Complete by: As directed    Follow with Primary MD in 1-2 weeks  Follow-up with vascular surgery as instructed  Please call you primary care MD for further refills of Eliquis, if you cannot get refills on Eliquis/supply of Eliquis-please ask your primary care MD to switch you to warfarin/Coumadin.  If you have black stools, bloody stools, blood in your vomitus-please seek immediate medical attention.  Use compression stockings to decrease swelling in your lower extremities.  Please get a complete blood count and chemistry panel checked by your Primary MD at your next visit, and again as instructed by your Primary MD.  Get Medicines reviewed and adjusted: Please take all your medications with you for your next visit with your Primary MD  Laboratory/radiological data: Please request your Primary MD to go over all hospital tests and procedure/radiological results at the follow up, please ask your Primary MD to get all Hospital records sent to his/her office.  In some cases, they will be blood work, cultures and biopsy results pending at the time of your discharge. Please request that your primary care M.D. follows up on these results.  Also Note the following: If you experience worsening of your admission symptoms, develop shortness of breath, life threatening emergency, suicidal or homicidal thoughts you must seek medical attention immediately by calling 911 or calling your MD immediately  if symptoms less severe.  You must read complete instructions/literature along with all the possible adverse reactions/side effects for  all the Medicines you take and that have been prescribed to you. Take any new Medicines after you have completely understood and accpet all the possible adverse reactions/side effects.   Do not drive when taking Pain medications or sleeping medications (Benzodaizepines)  Do not  take more than prescribed Pain, Sleep and Anxiety Medications. It is not advisable to combine anxiety,sleep and pain medications without talking with your primary care practitioner  Special Instructions: If you have smoked or chewed Tobacco  in the last 2 yrs please stop smoking, stop any regular Alcohol  and or any Recreational drug use.  Wear Seat belts while driving.  Please note: You were cared for by a hospitalist during your hospital stay. Once you are discharged, your primary care physician will handle any further medical issues. Please note that NO REFILLS for any discharge medications will be authorized once you are discharged, as it is imperative that you return to your primary care physician (or establish a relationship with a primary care physician if you do not have one) for your post hospital discharge needs so that they can reassess your need for medications and monitor your lab values.   Incidental finding-1.8 cm right adrenal nodule-please ask your primary care practitioner to repeat CT/MRI abdomen and 12 months.  In some cases this can turn out to be cancerous.   Increase activity slowly   Complete by: As directed    No wound care   Complete by: As directed       Allergies as of 11/18/2020   No Known Allergies      Medication List     STOP taking these medications    Eliquis 5 MG Tabs tablet Generic drug: apixaban Replaced by: Eliquis DVT/PE Starter Pack   oxyCODONE-acetaminophen 5-325 MG tablet Commonly known as: PERCOCET/ROXICET       TAKE these medications    amLODipine 5 MG tablet Commonly known as: NORVASC Take 1 tablet (5 mg total) by mouth daily.   amoxicillin-clavulanate 875-125 MG tablet Commonly known as: AUGMENTIN Take 1 tablet by mouth every 12 (twelve) hours for 16 days.   cyclobenzaprine 10 MG tablet Commonly known as: FLEXERIL Take 1 tablet (10 mg total) by mouth 3 (three) times daily as needed for muscle spasms. What changed:  when to  take this reasons to take this   Eliquis DVT/PE Starter Pack Generic drug: Apixaban Starter Pack (10mg  and 5mg ) Take as directed on package: start with two-5mg  tablets twice daily for 7 days. On day 8, switch to one-5mg  tablet twice daily. Replaces: Eliquis 5 MG Tabs tablet   gabapentin 100 MG capsule Commonly known as: NEURONTIN Take 1 capsule (100 mg total) by mouth 3 (three) times daily.        Follow-up Information     Hunsucker, Bonna Gains, MD Follow up on 12/17/2020.   Specialty: Pulmonary Disease Why: Follow up with lung doctor at 930 am. Contact information: [address] [place] [address] [phone]         [facility] Follow up in 1 week(s).   Why: We will ask our staff to call and schedule an appoitment for you in 1 -2 weeks. If you do not hear from our staff in 1 week, please call to schedule an appoitment. Contact information: [address] [place] [phone]        [facility] Follow up in 2 week(s).   Specialty: Vascular Surgery  Why: Office will call with date/time, If you dont hear from them,please give them a call Contact information: [place] [phone]               No Known Allergies  Other Procedures/Studies: DG Abd 1 View  Result Date: 11/12/2020 CLINICAL DATA:  Encounter for IVC location. Patient reports IVC filter was placed 2 years ago. EXAM: ABDOMEN - 1 VIEW COMPARISON:  None. FINDINGS: IVC filter is seen to the right of L2-L3. normal visualized bowel gas pattern. Excreted IV contrast within both renal collecting systems from prior CT. Kyphoplasty within lower thoracic vertebra, partially included. IMPRESSION: IVC filter to the right of L2-L3. Electronically Signed   By: Keith Rake M.D.   On: 11/12/2020 16:10   CT Angio Chest PE W and/or Wo Contrast  Result Date:  11/12/2020 CLINICAL DATA:  Bilateral lower extremity swelling. EXAM: CT ANGIOGRAPHY CHEST WITH CONTRAST TECHNIQUE: Multidetector CT imaging of the chest was performed using the standard protocol during bolus administration of intravenous contrast. Multiplanar CT image reconstructions and MIPs were obtained to evaluate the vascular anatomy. CONTRAST:  23mL OMNIPAQUE IOHEXOL 350 MG/ML SOLN COMPARISON:  None. FINDINGS: Cardiovascular: Large linear pulmonary embolus is noted in the distal right pulmonary artery which extends into the upper and lower lobe branches. RV/LV ratio of 1.4 is noted suggesting right heart strain. Normal cardiac size. Coronary artery calcifications are noted. No pericardial effusion is noted. Filling defect is also seen in upper lobe branch of the left pulmonary artery consistent with pulmonary embolus. Mediastinum/Nodes: No enlarged mediastinal, hilar, or axillary lymph nodes. Thyroid gland, trachea, and esophagus demonstrate no significant findings. Lungs/Pleura: No pneumothorax or pleural effusion is noted. 3.8 x 1.9 cm fluid-filled cavitary abnormality is noted posteriorly in the right lower lobe concerning for possible abscess or infected bulla. Upper Abdomen: 1.8 cm right adrenal nodule is noted. Musculoskeletal: Status post kyphoplasty at 2 lower thoracic levels. Review of the MIP images confirms the above findings. IMPRESSION: Large linear pulmonary embolus is noted in the distal right pulmonary artery which extends into the upper and lower lobe branches. Smaller embolus is seen in upper lobe branch of left pulmonary artery. Positive for acute PE with CT evidence of right heart strain (RV/LV Ratio = 1.4) consistent with at least submassive (intermediate risk) PE. The presence of right heart strain has been associated with an increased risk of morbidity and mortality. Please refer to the "PE Focused" order set in EPIC. 3.8 x 1.9 cm fluid-filled cavitary abnormality is noted posteriorly in  the right lower lobe concerning for possible abscess or infected bulla. Critical Value/emergent results were called by telephone at the time of interpretation on 11/12/2020 at 4:07 pm to provider [facility] , who verbally acknowledged these results. 1.8 cm right adrenal nodule is noted. Follow-up CT or MRI in 12 months is recommended to ensure stability. Electronically Signed   By: Marijo Conception M.D.   On: 11/12/2020 16:09   CT ANGIO AO+BIFEM W & OR WO CONTRAST  Result Date: 11/13/2020 CLINICAL DATA:  60 year old male with occluded indwelling inferior vena cava filter. EXAM: CT ANGIOGRAPHY OF ABDOMINAL AORTA WITH ILIOFEMORAL RUNOFF TECHNIQUE: Multidetector CT imaging of the abdomen, pelvis and lower extremities was performed using the standard protocol during bolus administration of intravenous contrast. Multiplanar CT image reconstructions and MIPs were obtained to evaluate the vascular anatomy. CONTRAST:  174mL OMNIPAQUE IOHEXOL 350 MG/ML SOLN COMPARISON:  None. FINDINGS: VASCULAR Aorta: Normal caliber aorta without  aneurysm, dissection, vasculitis or significant stenosis. Celiac: Patent without evidence of aneurysm, dissection, vasculitis or significant stenosis. SMA: Patent without evidence of aneurysm, dissection, vasculitis or significant stenosis. Renals: Single bilateral renal arteries are patent without evidence of aneurysm, dissection, vasculitis, fibromuscular dysplasia or significant stenosis. IMA: Patent without evidence of aneurysm, dissection, vasculitis or significant stenosis. RIGHT Lower Extremity Inflow: Common, internal and external iliac arteries are patent without evidence of aneurysm, dissection, vasculitis or significant stenosis. Outflow: Tapering of the distal superficial femoral artery at the level of Hunter's canal with scattered atherosclerotic plaque to complete occlusion of the popliteal artery. There are prominent perigeniculate collaterals. Common and profunda femoral arteries are  patent without evidence of aneurysm, dissection, vasculitis or significant stenosis. Runoff: There are separate distal reconstitutions to the proximal anterior tibial artery, peroneal arter, and posterior tibial arteries. Patent 3 vessel runoff to the level of the foot. LEFT Lower Extremity Inflow: Common, internal and external iliac arteries are patent without evidence of aneurysm, dissection, vasculitis or significant stenosis. Outflow: Occlusion of the popliteal artery throughout its length. Prominent perigeniculate collaterals. The common, profundal, and superficial femoral arteries are patent. Runoff: Geniculate collaterals reconstitute to the trifurcation. The distal peroneal is diminutive. The posterior tibial anterior tibial arteries are patent to the level of the ankle, diminutive below. Veins: Expansile, acute appearing thrombus extending throughout the bilateral popliteal and femoral veins, through the common femoral veins, external iliac veins, common iliac veins, an infrarenal inferior vena cava through the level of the indwelling infrarenal inferior vena cava filter. The filter appears well position without complicating features. The bilateral renal veins and suprarenal inferior vena cava are patent. Review of the MIP images confirms the above findings. NON-VASCULAR Lower chest: Similar appearing irregularly-shaped right basilar cavitary lesion with air-fluid levels measuring up to approximately 4.0 x 2.0 cm. Scattered bibasilar subsegmental atelectasis. Similar appearing, partially visualized acute pulmonary embolism extending from the distal right main pulmonary artery into the lobar branches. The RV to LV ratio is calculated at 1.3. Hepatobiliary: No focal liver abnormality is seen. No gallstones, gallbladder wall thickening, or biliary dilatation. Pancreas: Unremarkable. No pancreatic ductal dilatation or surrounding inflammatory changes. Spleen: Normal in size without focal abnormality.  Adrenals/Urinary Tract: Unchanged 1.8 cm right adrenal nodule. The left adrenal gland is within normal limits. The kidneys are symmetric in size and enhancement bilaterally. No focal lesions or nephrolithiasis. No hydronephrosis. The bladder is distended with excreted previously administered intravenous contrast material. Node definite wall thickening. Stomach/Bowel: Stomach is within normal limits. Appendix appears normal. No evidence of bowel wall thickening, distention, or inflammatory changes. Lymphatic: No abdominopelvic lymphadenopathy. Reproductive: Prostate is upper limits of normal size (measures 5.0 cm in maximum axial dimension.). Other: No abdominal wall hernia or abnormality. No abdominopelvic ascites. Musculoskeletal: Status post segment vertebral body augmentation at T9 and X38 without complicating features. No acute osseous abnormality. IMPRESSION: VASCULAR 1. Acute expansile venous thrombus extending from the bilateral popliteal veins to just inferior to the renal vein confluence. The indwelling infrarenal inferior vena cava filter is occluded. No evidence of significant pelvic or lumbar collateralization. There are associated edematous changes in the bilateral lower extremities, right greater than left. 2. 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 Chronic occlusion of the left popliteal artery with three-vessel (but distally diminutive peroneal) runoff supplied by perigeniculate collaterals. 3. Similar appearing though partially visualized acute distal right main pulmonary embolism extending into the lobar branches. There is  associated right heart strain within RV to LV ratio of 1.3, similar to comparison. NON-VASCULAR 1. No acute abdominopelvic abnormality. 2. Similar appearing cavitary lesion in the right lower lobe. This remains suspicious for infected pulmonary abscess. 3. Status  post T9-T11 vertebral body cement augmentation. 4. Prostatomegaly. 5. Similar appearing 1.8 cm right adrenal nodule. Agree with recent prior recommendation of follow-up CT or MRI adrenal mass protocol in 12 months. Ruthann Cancer, MD [facility] Electronically Signed   By: Ruthann Cancer MD   On: 11/13/2020 16:44   PERIPHERAL VASCULAR CATHETERIZATION  Result Date: 11/15/2020 Formatting of this result is different from the original. Patient name: Almin Livingstone       MRN: [id number]        DOB: 1961/02/22          Sex: male   11/14/2020 Pre-operative Diagnosis: Extensive bilateral lower extremity DVTs including iliac veins with IVC occlusion below IVC filter Post-operative diagnosis:  Same Surgeon:  Marty Heck, MD Procedure Performed: 1.  Ultrasound-guided access of left popliteal vein 2.  Left leg venogram and venacavogram 3.  Placement of left lower extremity thrombolytics catheter with initiation of chemical thrombolysis including the left popliteal, femoral, common femoral, iliac, and IVC 4.  Ultrasound-guided access of right small saphenous vein 5.  Placement of right lower extremity thrombolytics catheter with initiation of chemical thrombolysis including the right popliteal, femoral, common femoral, iliac and IVC   Indications: 60 year old male seen in consultation earlier this week with extensive bilateral lower extremity DVTs secondary to an occluded IVC filter including occlusion of his inferior vena cava and both iliac veins.  He presents today for planned bilateral lower extremity thrombolytics catheter placement through popliteal approach with plans for angio [facility] tomorrow.  Risks and benefits have been discussed.   Findings: I initially accessed the left small saphenous but could not get the wire to advance.  I then accessed the left popliteal vein and had some difficulty getting the wire to track through the left femoral segment  suggesting some chronic component to his thrombus but ultimately was able to get into the iliac vein and above the IVC filter where I confirmed with hand-injection that I was in the IVC.  I then accessed the right small saphenous vein and the wire easily advanced through the iliofemoral segment and into the IVC.  Bilateral lower extremity thrombolytics catheters were placed with the tips just above the IVC filter.             Procedure:  The patient was identified in the holding area and taken to room 8.  Patient was placed prone on the table.  Bilateral popliteal fossa's were then prepped and draped.  A timeout was performed.  Initially evaluated the left small saphenous vein and it appeared compressible and patent and image was saved.  This was accessed with a micro access needle and placed a microwire and I could not get the wire to advance even though I could clearly visualize the wire was in the true lumen.  I then elected to access the left popliteal vein and again this was evaluated ultrasound, appeared patent, and image was saved.  I then accessed this with micro access needle and placed a microwire and then a micro sheath.  I did shoot a left leg venogram given had some resistance getting the wire to advance and this did show what look like an occluded femoral segment in the left leg with collaterals.  I then placed a Glidewire advantage and a short 6 French sheath in the left popliteal vein.  I then used a BER 2 catheter with a Glidewire advantage to cross the occluded femoral vein up into the iliac vein and IVC.  I got my catheter above the vena cava filter to confirm I was in the true lumen which I was.  The UniFuse catheter was then placed from the left popliteal access up to the IVC filter.  I then evaluated the right small saphenous vein it was patent and image was saved.  I accessed this with micro access needle placed a microwire and then micro sheath and I then advanced the Glidewire advantage up  the right small saphenous vein and this advanced easily through the iliofemoral segment into the IVC.  I then upsized to a short 6 French sheath in the right small saphenous vein and advanced my UniFuse catheter over the wire up into the IVC filter.  Both sheaths were secured and TPA will be initiated at 0.5 mg an hour through each UniFuse catheter with tPA.  He remained stable.  Will be transported to ICU overnight.   Plan: We will initiate thrombolysis at 0.5 mg an hour through each UniFuse catheter.  Will initiate heparin at 400 units an hour through each sheath.  Pharmacy will help titrate heparin.     Marty Heck, MD [facility] [place] Office: [phone]  ECHOCARDIOGRAM COMPLETE  Result Date: 11/13/2020    ECHOCARDIOGRAM REPORT   Patient Name:   BRALIN GARRY Date of Exam: 11/13/2020 Medical Rec #:  [id number]       Height:       73.0 in Accession #:    [id number]      Weight:       238.6 lb Date of Birth:  Dec 14, 1960       BSA:          2.319 m Patient Age:    85 years        BP:           133/87 mmHg Patient Gender: M               HR:           90 bpm. Exam Location:  Inpatient Procedure: 2D Echo, Cardiac Doppler and Color Doppler Indications:    Pulmonary Embolus I26.09  History:        Patient has no prior history of Echocardiogram examinations.                 Risk Factors:Hypertension. DVT.  Sonographer:    Vickie Epley RDCS Referring Phys: [id number] RIPUDEEP K RAI IMPRESSIONS  1. Left ventricular ejection fraction, by estimation, is 50 to 55%. The left ventricle has low normal function. The left ventricle demonstrates regional wall motion abnormalities (see scoring diagram/findings for description). Left ventricular diastolic  parameters were normal. There is mild hypokinesis of the left ventricular, basal inferoseptal wall, inferior wall and inferolateral wall.  2. Right ventricular systolic function is normal. The right ventricular size is normal. There is normal  pulmonary artery systolic pressure.  3. The mitral valve is normal in structure. Trivial mitral valve regurgitation. No evidence of mitral stenosis.  4. The aortic valve was not well visualized. There is mild calcification of the aortic valve. Aortic valve regurgitation is trivial.  5. The inferior vena cava is normal in size with greater than 50% respiratory variability, suggesting right atrial pressure of 3  mmHg. Comparison(s): No prior Echocardiogram. Conclusion(s)/Recommendation(s): Mild abnormal wall motion in basal inferoseptum/inferior/inferolateral walls with low normal EF. RV incompletely visualized but appears grossly normal. FINDINGS  Left Ventricle: Left ventricular ejection fraction, by estimation, is 50 to 55%. The left ventricle has low normal function. The left ventricle demonstrates regional wall motion abnormalities. Mild hypokinesis of the left ventricular, basal inferoseptal  wall, inferior wall and inferolateral wall. The left ventricular internal cavity size was normal in size. There is no left ventricular hypertrophy. Left ventricular diastolic parameters were normal. Right Ventricle: The right ventricular size is normal. Right vetricular wall thickness was not well visualized. Right ventricular systolic function is normal. There is normal pulmonary artery systolic pressure. The tricuspid regurgitant velocity is 1.67 m/s, and with an assumed right atrial pressure of 3 mmHg, the estimated right ventricular systolic pressure is 96.7 mmHg. Left Atrium: Left atrial size was normal in size. Right Atrium: Right atrial size was normal in size. Pericardium: There is no evidence of pericardial effusion. Mitral Valve: The mitral valve is normal in structure. Trivial mitral valve regurgitation. No evidence of mitral valve stenosis. Tricuspid Valve: The tricuspid valve is normal in structure. Tricuspid valve regurgitation is trivial. No evidence of tricuspid stenosis. Aortic Valve: The aortic valve was  not well visualized. There is mild calcification of the aortic valve. Aortic valve regurgitation is trivial. Pulmonic Valve: The pulmonic valve was not well visualized. Pulmonic valve regurgitation is not visualized. Aorta: The aortic root, ascending aorta, aortic arch and descending aorta are all structurally normal, with no evidence of dilitation or obstruction. Venous: The inferior vena cava is normal in size with greater than 50% respiratory variability, suggesting right atrial pressure of 3 mmHg. IAS/Shunts: The interatrial septum was not well visualized.  LEFT VENTRICLE PLAX 2D LVIDd:         5.40 cm      Diastology LVIDs:         4.10 cm      LV e' medial:    8.27 cm/s LV PW:         1.00 cm      LV E/e' medial:  5.2 LV IVS:        1.00 cm      LV e' lateral:   10.00 cm/s LVOT diam:     2.20 cm      LV E/e' lateral: 4.3 LV SV:         60 LV SV Index:   26 LVOT Area:     3.80 cm  LV Volumes (MOD) LV vol d, MOD A2C: 128.0 ml LV vol d, MOD A4C: 117.0 ml LV vol s, MOD A2C: 58.7 ml LV vol s, MOD A4C: 53.8 ml LV SV MOD A2C:     69.3 ml LV SV MOD A4C:     117.0 ml LV SV MOD BP:      68.1 ml RIGHT VENTRICLE RV S prime:     19.40 cm/s TAPSE (M-mode): 2.4 cm LEFT ATRIUM             Index       RIGHT ATRIUM           Index LA diam:        3.70 cm 1.60 cm/m  RA Area:     14.90 cm LA Vol (A2C):   39.1 ml 16.84 ml/m RA Volume:   39.00 ml  16.82 ml/m LA Vol (A4C):   25.9 ml 11.17 ml/m LA Biplane Vol: 36.2 ml 15.61  ml/m  AORTIC VALVE LVOT Vmax:   84.20 cm/s LVOT Vmean:  57.600 cm/s LVOT VTI:    0.158 m  AORTA Ao Root diam: 3.70 cm Ao Asc diam:  3.70 cm Ao Desc diam: 2.70 cm MITRAL VALVE               TRICUSPID VALVE MV Area (PHT): 2.50 cm    TR Peak grad:   11.2 mmHg MV Decel Time: 303 msec    TR Vmax:        167.00 cm/s MV E velocity: 43.30 cm/s MV A velocity: 69.80 cm/s  SHUNTS MV E/A ratio:  0.62        Systemic VTI:  0.16 m                            Systemic Diam: 2.20 cm Buford Dresser MD Electronically  signed by Buford Dresser MD Signature Date/Time: 11/13/2020/6:18:19 PM    Final    VAS [place] LOWER EXTREMITY VENOUS (DVT) (ONLY [facility] & [facility])  Result Date: 11/12/2020  Lower Venous DVT Study Patient Name:  LEVENT KORNEGAY  Date of Exam:   11/12/2020 Medical Rec #: [id number]        Accession #:    [id number] Date of Birth: July 05, 1960        Patient Gender: M Patient Age:   59Y Exam Location:  [facility] Procedure:      VAS [place] LOWER EXTREMITY VENOUS (DVT) Referring Phys: [id number] JON KNAPP --------------------------------------------------------------------------------  Indications: Swelling, and Pain.  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 Anticoagulation: Eliquis. Limitations: Poor ultrasound/tissue interface. Performing Technologist: Rogelia Rohrer RVT, RDMS  Examination Guidelines: A complete evaluation includes B-mode imaging, spectral Doppler, color Doppler, and power Doppler as needed of all accessible portions of each vessel. Bilateral testing is considered an integral part of a complete examination. Limited examinations for reoccurring indications may be performed as noted. The reflux portion of the exam is performed with the patient in reverse Trendelenburg.  +---------+---------------+---------+-----------+----------+-----------------+ RIGHT    CompressibilityPhasicitySpontaneityPropertiesThrombus Aging    +---------+---------------+---------+-----------+----------+-----------------+ CFV      Partial        Yes      Yes                  Age Indeterminate +---------+---------------+---------+-----------+----------+-----------------+ SFJ      Partial                                      Acute             +---------+---------------+---------+-----------+----------+-----------------+ FV Prox  None           No       No                   Acute              +---------+---------------+---------+-----------+----------+-----------------+ FV Mid   None           No       No                   Acute             +---------+---------------+---------+-----------+----------+-----------------+ FV DistalNone           No  No                   Acute             +---------+---------------+---------+-----------+----------+-----------------+ PFV      None           No       No                   Acute             +---------+---------------+---------+-----------+----------+-----------------+ POP      None           No       No                   Acute             +---------+---------------+---------+-----------+----------+-----------------+ PTV      None           No       No                   Acute             +---------+---------------+---------+-----------+----------+-----------------+ PERO     None           No       No                   Acute             +---------+---------------+---------+-----------+----------+-----------------+ Gastroc  None           No       No                   Acute             +---------+---------------+---------+-----------+----------+-----------------+   +---------+---------------+---------+-----------+----------+--------------+ LEFT     CompressibilityPhasicitySpontaneityPropertiesThrombus Aging +---------+---------------+---------+-----------+----------+--------------+ CFV      Partial        Yes      Yes                  Acute          +---------+---------------+---------+-----------+----------+--------------+ SFJ      Partial                                      Acute          +---------+---------------+---------+-----------+----------+--------------+ FV Prox  Full           Yes      Yes                                 +---------+---------------+---------+-----------+----------+--------------+ FV Mid   None           No       No                   Acute           +---------+---------------+---------+-----------+----------+--------------+ FV DistalNone           No       No                   Acute          +---------+---------------+---------+-----------+----------+--------------+ PFV      Full  Yes      Yes                                 +---------+---------------+---------+-----------+----------+--------------+ POP      Partial        No       No                   Acute          +---------+---------------+---------+-----------+----------+--------------+ PTV      Full                                                        +---------+---------------+---------+-----------+----------+--------------+ PERO     Full                                                        +---------+---------------+---------+-----------+----------+--------------+     Summary: BILATERAL: -No evidence of popliteal cyst, bilaterally. RIGHT: - Findings consistent with acute deep vein thrombosis involving the SF junction, right femoral vein, right proximal profunda vein, right popliteal vein, right posterior tibial veins, right peroneal veins, and right gastrocnemius veins. - Findings consistent with age indeterminate deep vein thrombosis involving the right common femoral vein. - There is no evidence of superficial venous thrombosis.  LEFT: - Findings consistent with acute deep vein thrombosis involving the left common femoral vein, SF junction, left femoral vein, and left popliteal vein.  *See table(s) above for measurements and observations. Electronically signed by Ruta Hinds MD on 11/12/2020 at 3:53:09 PM.    Final    VAS US AORTA/IVC/ILIACS  Result Date: 11/12/2020 IVC/ILIAC STUDY Patient Name:  DEXTON ZWILLING  Date of Exam:   11/12/2020 Medical Rec #: [id number]        Accession #:    [id number] Date of Birth: 02/14/1961        Patient Gender: M Patient Age:   59Y Exam Location:  [facility] Procedure:      VAS US AORTA/IVC/ILIACS Referring  Phys: [id number] JON KNAPP --------------------------------------------------------------------------------  Indications: DVT OF BLE with extension into iliac system. 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                Paitent states he has IV filter also. Medical records unavailable                ([place] of state[place]  Comparison Study: No previous exams in our system. Performing Technologist: Rogelia Rohrer RVT, RDMS  Examination Guidelines: A complete evaluation includes B-mode imaging, spectral Doppler, color Doppler, and power Doppler as needed of all accessible portions of each vessel. Bilateral testing is considered an integral part of a complete examination. Limited examinations for reoccurring indications may be performed as noted.  IVC/Iliac Findings: +----------+------+--------+------------------------------+    IVC    PatentThrombus           Comments            +----------+------+--------+------------------------------+ IVC Prox  patent                                       +----------+------+--------+------------------------------+  IVC Mid         chronic mixed thrombus - acute/chronic +----------+------+--------+------------------------------+ IVC Distal       acute                                 +----------+------+--------+------------------------------+  +-------------+---------+-----------+---------+-----------+--------------------+      CIV     RT-PatentRT-ThrombusLT-PatentLT-Thrombus      Comments       +-------------+---------+-----------+---------+-----------+--------------------+ Common Iliac            chronic              acute    RT mixed thrombus   Mid                                                     acute/chronic     +-------------+---------+-----------+---------+-----------+--------------------+  +--------------+---------+-----------+---------+-----------+-------------------+      EIV       RT-PatentRT-ThrombusLT-PatentLT-Thrombus     Comments       +--------------+---------+-----------+---------+-----------+-------------------+ External Iliac           chronic              acute   RT mixed thrombus - Vein Mid                                                 acute/chronic    +--------------+---------+-----------+---------+-----------+-------------------+   Summary: IVC/Iliac: There is evidence of age indeterminate thrombus involving the IVC. There is evidence of age indeterminate thrombus involving the right common iliac vein. There is evidence of acute thrombus involving the left common iliac vein. There is evidence of age indeterminate thrombus involving the right external iliac vein. There is evidence of acute thrombus involving the left external iliac vein.  *See table(s) above for measurements and observations.  Electronically signed by Ruta Hinds MD on 11/12/2020 at 3:47:02 PM.    Final    HYBRID OR IMAGING ([place])  Result Date: 11/15/2020 There is no interpretation for this exam.  This order is for images obtained during a surgical procedure.  Please See "Surgeries" Tab for more information regarding the procedure.     TODAY-DAY OF DISCHARGE:  Subjective:   Derel Mcglasson today has no headache,no chest abdominal pain,no new weakness tingling or numbness, feels much better wants to go home today.  Objective:   Blood pressure 127/80, pulse 98, temperature 98 F (36.7 C), temperature source Oral, resp. rate (!) 23, height 6\' 1"  (1.854 m), weight 108.2 kg, SpO2 97 %.  Intake/Output Summary (Last 24 hours) at 11/18/2020 1015 Last data filed at 11/18/2020 0807 Gross per 24 hour  Intake 960 ml  Output 1400 ml  Net -440 ml   Filed Weights   11/12/20 1151 11/12/20 2240  Weight: 99.8 kg 108.2 kg    Exam: Awake Alert, Oriented *3, No new F.N deficits, Normal affect [place].AT,PERRAL Supple Neck,No JVD, No cervical lymphadenopathy appriciated.  Symmetrical  Chest wall movement, Good air movement bilaterally, CTAB RRR,No Gallops,Rubs or new Murmurs, No Parasternal Heave +ve B.Sounds, Abd Soft, Non tender, No organomegaly appriciated, No rebound -guarding or rigidity. No Cyanosis, Clubbing or edema, No new Rash or bruise  PERTINENT RADIOLOGIC STUDIES: No results found.   PERTINENT LAB RESULTS: CBC: Recent Labs    11/17/20 0101 11/18/20 0054  WBC 6.5 5.7  HGB 8.0* 8.1*  HCT 24.1* 25.0*  PLT 222 238   CMET CMP     Component Value Date/Time   NA 135 11/18/2020 0054   K 4.2 11/18/2020 0054   CL 103 11/18/2020 0054   CO2 26 11/18/2020 0054   GLUCOSE 92 11/18/2020 0054   BUN 14 11/18/2020 0054   CREATININE 0.93 11/18/2020 0054   CALCIUM 8.5 (L) 11/18/2020 0054   PROT 6.4 (L) 11/14/2020 0429   ALBUMIN 3.1 (L) 11/14/2020 0429   AST 17 11/14/2020 0429   ALT 13 11/14/2020 0429   ALKPHOS 47 11/14/2020 0429   BILITOT 0.9 11/14/2020 0429   GFRNONAA >60 11/18/2020 0054    GFR Estimated Creatinine Clearance: 113 mL/min (by C-G formula based on SCr of 0.93 mg/dL). No results for input(s): LIPASE, AMYLASE in the last 72 hours. No results for input(s): CKTOTAL, CKMB, CKMBINDEX, TROPONINI in the last 72 hours. Invalid input(s): POCBNP No results for input(s): DDIMER in the last 72 hours. No results for input(s): HGBA1C in the last 72 hours. No results for input(s): CHOL, HDL, LDLCALC, TRIG, CHOLHDL, LDLDIRECT in the last 72 hours. No results for input(s): TSH, T4TOTAL, T3FREE, THYROIDAB in the last 72 hours.  Invalid input(s): FREET3 No results for input(s): VITAMINB12, FOLATE, FERRITIN, TIBC, IRON, RETICCTPCT in the last 72 hours. Coags: No results for input(s): INR in the last 72 hours.  Invalid input(s): PT Microbiology: Recent Results (from the past 240 hour(s))  Resp Panel by RT-PCR (Flu A&B, Covid) Nasopharyngeal Swab     Status: None   Collection Time: 11/12/20  5:11 PM   Specimen: Nasopharyngeal Swab; Nasopharyngeal(NP)  swabs in vial transport medium  Result Value Ref Range Status   SARS Coronavirus 2 by RT PCR NEGATIVE NEGATIVE Final    Comment: (NOTE) SARS-CoV-2 target nucleic acids are NOT DETECTED.  The SARS-CoV-2 RNA is generally detectable in upper respiratory specimens during the acute phase of infection. The lowest concentration of SARS-CoV-2 viral copies this assay can detect is 138 copies/mL. A negative result does not preclude SARS-Cov-2 infection and should not be used as the sole basis for treatment or other patient management decisions. A negative result may occur with  improper specimen collection/handling, submission of specimen other than nasopharyngeal swab, presence of viral mutation(s) within the areas targeted by this assay, and inadequate number of viral copies(<138 copies/mL). A negative result must be combined with clinical observations, patient history, and epidemiological information. The expected result is Negative.  Fact Sheet for Patients:  EntrepreneurPulse.com.au  Fact Sheet for Healthcare Providers:  IncredibleEmployment.be  This test is no t yet approved or cleared by the [place] FDA and  has been authorized for detection and/or diagnosis of SARS-CoV-2 by FDA under an Emergency Use Authorization (EUA). This EUA will remain  in effect (meaning this test can be used) for the duration of the COVID-19 declaration under Section 564(b)(1) of the Act, 21 U.S.C.section 360bbb-3(b)(1), unless the authorization is terminated  or revoked sooner.       Influenza A by PCR NEGATIVE NEGATIVE Final   Influenza B by PCR NEGATIVE NEGATIVE Final    Comment: (NOTE) The Xpert Xpress SARS-CoV-2/FLU/RSV plus assay is intended as an aid in the diagnosis of influenza from Nasopharyngeal swab specimens and should not be used as a sole basis for treatment. Nasal washings and aspirates are  unacceptable for Xpert Xpress  SARS-CoV-2/FLU/RSV testing.  Fact Sheet for Patients: EntrepreneurPulse.com.au  Fact Sheet for Healthcare Providers: IncredibleEmployment.be  This test is not yet approved or cleared by the [place] FDA and has been authorized for detection and/or diagnosis of SARS-CoV-2 by FDA under an Emergency Use Authorization (EUA). This EUA will remain in effect (meaning this test can be used) for the duration of the COVID-19 declaration under Section 564(b)(1) of the Act, 21 U.S.C. section 360bbb-3(b)(1), unless the authorization is terminated or revoked.  Performed at [facility], [place] [address] [place], [place] [address]   Culture, blood (Routine X 2) w Reflex to ID Panel     Status: None   Collection Time: 11/12/20  6:25 PM   Specimen: BLOOD  Result Value Ref Range Status   Specimen Description   Final    BLOOD SITE NOT SPECIFIED Performed at [place] [address], [place], [place] [address]    Special Requests   Final    BOTTLES DRAWN AEROBIC ONLY Blood Culture adequate volume Performed at [facility] [address], [place], [place] [address]    Culture   Final    NO GROWTH 5 DAYS Performed at [facility], [place] [address], [place], [place] [address]    Report Status 11/17/2020 FINAL  Final  Culture, blood (Routine X 2) w Reflex to ID Panel     Status: None   Collection Time: 11/12/20  6:39 PM   Specimen: BLOOD  Result Value Ref Range Status   Specimen Description   Final    BLOOD RIGHT ANTECUBITAL Performed at [place] [address] [place], [place] [address]    Special Requests   Final    BOTTLES DRAWN AEROBIC AND ANAEROBIC Blood Culture adequate volume Performed at [place] [address], [place], [place] [address]    Culture   Final    NO GROWTH 5 DAYS Performed at [facility], [place] [address]  [place], [place] [address]    Report Status 11/17/2020 FINAL  Final    FURTHER DISCHARGE INSTRUCTIONS:  Get Medicines reviewed and adjusted: Please take all your medications with you for your next visit with your Primary MD  Laboratory/radiological data: Please request your Primary MD to go over all hospital tests and procedure/radiological results at the follow up, please ask your Primary MD to get all Hospital records sent to his/her office.  In some cases, they will be blood work, cultures and biopsy results pending at the time of your discharge. Please request that your primary care M.D. goes through all the records of your hospital data and follows up on these results.  Also Note the following: If you experience worsening of your admission symptoms, develop shortness of breath, life threatening emergency, suicidal or homicidal thoughts you must seek medical attention immediately by calling 911 or calling your MD immediately  if symptoms less severe.  You must read complete instructions/literature along with all the possible adverse reactions/side effects for all the Medicines you take and that have been prescribed to you. Take any new Medicines after you have completely understood and accpet all the possible adverse reactions/side effects.   Do not drive when taking Pain medications or sleeping medications (Benzodaizepines)  Do not take more than prescribed Pain, Sleep and Anxiety Medications. It is not advisable to combine anxiety,sleep and pain medications without talking with your primary care practitioner  Special Instructions:  If you have smoked or chewed Tobacco  in the last 2 yrs please stop smoking, stop any regular Alcohol  and or any Recreational drug use.  Wear Seat belts while driving.  Please note: You were cared for by a hospitalist during your hospital stay. Once you are discharged, your primary care physician will handle any further medical issues. Please note that NO  REFILLS for any discharge medications will be authorized once you are discharged, as it is imperative that you return to your primary care physician (or establish a relationship with a primary care physician if you do not have one) for your post hospital discharge needs so that they can reassess your need for medications and monitor your lab values.  Total Time spent coordinating discharge including counseling, education and face to face time equals 45 minutes.  SignedOren Binet 11/18/2020 10:15 AM

## 2020-11-18 NOTE — TOC Transition Note (Signed)
Transition of Care (TOC) - CM/SW Discharge Note Marvetta Gibbons RN, BSN Transitions of Care Unit 4E- RN Case Manager See Treatment Team for direct phone #    Patient Details  Name: Edgar Olson MRN: [id number] Date of Birth: 04-Apr-1961  Transition of Care [facility]) CM/SW Contact:  Dawayne Patricia, RN Phone Number: 11/18/2020, 12:18 PM   Clinical Narrative:    Pt stable for transition home today, TOC pharmacy to assist with medications and Eliquis needs- MATCH has been done for med needs per previous CM note, and Eliquis application has been faxed in to pt. Assistance program for Eliquis needs.   Call made to IM clinic- and per clinic appointment has been made for July 20 at 1:45- info placed on AVS for pt.   Pt to be assisted with transportation with [facility]- unit to set up once pt is ready for discharge.    Final next level of care: Home/Self Care Barriers to Discharge: Barriers Resolved   Patient Goals and CMS Choice Patient states their goals for this hospitalization and ongoing recovery are:: to return home   Choice offered to / list presented to : NA  Discharge Placement               Home        Discharge Plan and Services   Discharge Planning Services: CM Consult, [place] Program, Medication Assistance, Follow-up appt scheduled            DME Arranged: N/A DME Agency: NA       HH Arranged: NA HH Agency: NA        Social Determinants of Health (SDOH) Interventions     Readmission Risk Interventions Readmission Risk Prevention Plan 11/18/2020  Post Dischage Appt Complete  Medication Screening Complete  Transportation Screening Complete

## 2020-11-18 NOTE — Progress Notes (Signed)
[facility]  Subjective  - some foot and ankle swelling   Objective 131/82 73 97.9 F (36.6 C) (Oral) 17 97%  Intake/Output Summary (Last 24 hours) at 11/18/2020 0756 Last data filed at 11/18/2020 0500 Gross per 24 hour  Intake --  Output 1000 ml  Net -1000 ml   Mild foot and ankle edema but less tense Feet pink warm   Assessment/Planning: S/p ivc removal replacement thrombolysis Emphasized compliance with anticoagulation Needs to ambulate Will sign off Will arrange follow up caval iliac and bilateral lower extremity duplex at follow up in 2 weeks.  Ruta Hinds 11/18/2020 7:56 AM --  Laboratory Lab Results: Recent Labs    11/17/20 0101 11/18/20 0054  WBC 6.5 5.7  HGB 8.0* 8.1*  HCT 24.1* 25.0*  PLT 222 238   BMET Recent Labs    11/17/20 0101 11/18/20 0054  NA 134* 135  K 3.9 4.2  CL 102 103  CO2 26 26  GLUCOSE 104* 92  BUN 17 14  CREATININE 1.00 0.93  CALCIUM 8.1* 8.5*    COAG Lab Results  Component Value Date   INR 1.1 11/12/2020   No results found for: PTT

## 2020-11-18 NOTE — Progress Notes (Signed)
Discharge orders received. IV and telemetry removed. CCMD notified. Pt is dressed and has received meds from [place].  As per case mgt, his pt assistance forms have been faxed and process initiated so he is able to continue with home medications (eliquis). Plan for transport home via [employer] as his son isn't available until at least 4pm.

## 2020-11-22 ENCOUNTER — Other Ambulatory Visit (HOSPITAL_COMMUNITY): Payer: Self-pay

## 2020-11-22 ENCOUNTER — Telehealth (HOSPITAL_COMMUNITY): Payer: Self-pay

## 2020-11-22 NOTE — Telephone Encounter (Signed)
Pharmacy Transitions of Care Follow-up Telephone Call  Date of discharge: 11/18/20  Discharge Diagnosis: Bilateral DVT, PE  How have you been since you were released from the hospital? Worsening pain in left calf, otherwise healing and feeling better   Medication changes made at discharge:  - START: Eliquis starter pack, Augmentin  - STOPPED:   - CHANGED:   Medication changes verified by the patient? Yes (Yes/No)    Medication Accessibility:  Home Pharmacy:    Was the patient provided with refills on discharged medications? No   Have all prescriptions been transferred from [facility] to home pharmacy? NA   Is the patient able to afford medications?  Notable copays:  Eligible patient assistance:     Medication Review: (  APIXABAN (ELIQUIS)  Started pack  - Discussed importance of taking medication around the same time everyday  - Reviewed potential DDIs with patient  - Advised patient of medications to avoid (NSAIDs, ASA)  - Educated that Tylenol (acetaminophen) will be the preferred analgesic to prevent risk of bleeding  - Emphasized importance of monitoring for signs and symptoms of bleeding (abnormal bruising, prolonged bleeding, nose bleeds, bleeding from gums, discolored urine, black tarry stools)  - Advised patient to alert all providers of anticoagulation therapy prior to starting a new medication or having a procedure    Follow-up Appointments: Larey Days 12/17/20 [facility] will call and schedule appt for follow up [facility]-advised pt to call today instead of waiting 2 weeks  If their condition worsens, is the pt aware to call PCP or go to the Emergency Dept.? yes  Final Patient Assessment: Pt is overall improved. Pt states he has worsening pain in left calf. I advised pt to call [facility] right away today.He is not ambulatory at this time. His son will be home from work at [employer] to help him.

## 2020-11-25 ENCOUNTER — Telehealth: Payer: Self-pay

## 2020-11-25 NOTE — Telephone Encounter (Signed)
Patient enrolled into the Transportation program.   Edgar Olson DOB: Jul 29, 1960 MRN: [id number]   RIDER WAIVER AND RELEASE OF LIABILITY  For purposes of improving physical access to our facilities, [facility] is pleased to partner with third parties to provide [facility] patients or other authorized individuals the option of convenient, on-demand ground transportation services (the [place]") through use of the technology service that enables users to request on-demand ground transportation from independent third-party providers.  By opting to use and accept these [employer], I, the undersigned, hereby agree on behalf of myself, and on behalf of any minor child using the Government social research officer for whom I am the parent or legal guardian, as follows:  Government social research officer provided to me are provided by independent third-party transportation providers who are not [employer] or employees and who are unaffiliated with [employer]. [place] is neither a transportation carrier nor a common or public carrier. [facility] has no control over the quality or safety of the transportation that occurs as a result of the [employer]. [facility] cannot guarantee that any third-party transportation provider will complete any arranged transportation service. [facility] makes no representation, warranty, or guarantee regarding the reliability, timeliness, quality, safety, suitability, or availability of any of the Transport Services or that they will be error free. I fully understand that traveling by vehicle involves risks and dangers of serious bodily injury, including permanent disability, paralysis, and death. I agree, on behalf of myself and on behalf of any minor child using the Transport Services for whom I am the parent or legal guardian, that the entire risk arising out of my use of the [employer] remains solely with me, to the maximum extent permitted under  applicable law. The [employer] are provided "as is" and "as available." [facility] disclaims all representations and warranties, express, implied or statutory, not expressly set out in these terms, including the implied warranties of merchantability and fitness for a particular purpose. I hereby waive and release [facility], its agents, employees, officers, directors, representatives, insurers, attorneys, assigns, successors, subsidiaries, and affiliates from any and all past, present, or future claims, demands, liabilities, actions, causes of action, or suits of any kind directly or indirectly arising from acceptance and use of the [employer]. I further waive and release [place] and its affiliates from all present and future liability and responsibility for any injury or death to persons or damages to property caused by or related to the use of the [employer]. I have read this Waiver and Release of Liability, and I understand the terms used in it and their legal significance. This Waiver is freely and voluntarily given with the understanding that my right (as well as the right of any minor child for whom I am the parent or legal guardian using the [employer]) to legal recourse against  in connection with the [employer] is knowingly surrendered in return for use of these services.   I attest that I read the consent document to Edgar Olson, gave Mr. L[place]elle the opportunity to ask questions and answered the questions asked (if any). I affirm that Edgar Olson then provided consent for he's participation in this program.     Cameron Proud

## 2020-11-27 ENCOUNTER — Other Ambulatory Visit: Payer: Self-pay

## 2020-11-27 ENCOUNTER — Encounter: Payer: Self-pay | Admitting: Internal Medicine

## 2020-11-27 ENCOUNTER — Ambulatory Visit (INDEPENDENT_AMBULATORY_CARE_PROVIDER_SITE_OTHER): Payer: Self-pay | Admitting: Internal Medicine

## 2020-11-27 VITALS — BP 146/92 | HR 91 | Temp 98.6°F | Resp 28 | Ht 73.4 in | Wt 223.3 lb

## 2020-11-27 DIAGNOSIS — J984 Other disorders of lung: Secondary | ICD-10-CM

## 2020-11-27 DIAGNOSIS — I2699 Other pulmonary embolism without acute cor pulmonale: Secondary | ICD-10-CM

## 2020-11-27 DIAGNOSIS — R011 Cardiac murmur, unspecified: Secondary | ICD-10-CM

## 2020-11-27 DIAGNOSIS — I10 Essential (primary) hypertension: Secondary | ICD-10-CM

## 2020-11-27 DIAGNOSIS — D62 Acute posthemorrhagic anemia: Secondary | ICD-10-CM

## 2020-11-27 DIAGNOSIS — R931 Abnormal findings on diagnostic imaging of heart and coronary circulation: Secondary | ICD-10-CM

## 2020-11-27 DIAGNOSIS — I82403 Acute embolism and thrombosis of unspecified deep veins of lower extremity, bilateral: Secondary | ICD-10-CM

## 2020-11-27 DIAGNOSIS — E278 Other specified disorders of adrenal gland: Secondary | ICD-10-CM

## 2020-11-27 DIAGNOSIS — C4490 Unspecified malignant neoplasm of skin, unspecified: Secondary | ICD-10-CM

## 2020-11-27 NOTE — Assessment & Plan Note (Addendum)
[name] was recently hospitalized for acute VTE during which she had thrombectomy with catheter directed thrombolysis.  On admission, his hemoglobin was noted to be normal between 12-13.  On discharge his hemoglobin was 8.1, likely secondary to acute blood loss anemia.  Today, [name] denies any palpitations, shortness of breath, dizziness.    Assessment/plan: - CBC today to evaluate hemoglobin  ADDENDUM: Hemoglobin has improved to 10.4.

## 2020-11-27 NOTE — Assessment & Plan Note (Signed)
[name] was recently admitted to the hospital from 7/5-7/11.  He presented with symptoms of lower extremity swelling.  He had a CTA with lower extremity runoff on 7/7 that demonstrated an acute expansile venous thrombus extending into the bilateral popliteal veins all the way to the renal vein confluence.  Patient's IVC filter was occluded.  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  Vascular surgery was consulted the patient underwent emergent thrombectomy with catheter directed thrombolysis of bilateral lower extremities.  Additionally, IVC filter was removed and replaced with a new one.   Since discharge, [name] states that he was feeling well for the first 3 days, however he subsequently began to develop left lower extremity swelling and pain.  The swelling and pain worsened over a period of 5 days, however has stabilized over the last 1 to 2 days without any worsening in symptoms, however he has not had any improvement.  He notes during this time, he  developed numbness of the left lateral aspect of his foot.  This is made it difficult for him to bear weight in addition to significant pain.  Since symptom onset he has been keeping his foot elevated which does help somewhat.    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  He was under the impression that this was secondary to a skin cancer given that his skin cancer occurred around the same time, however he is unsure if he had a hypercoagulable work-up done.  He understands the need for lifelong anticoagulation at this point  Assessment/plan: Patient with extensive history of VTE with large clot burden.  Recent CTA with lower extremity runoff did demonstrate arterial occlusion as well, however uncertain if  this is due to clot versus atherosclerotic disease.  Patient has a history malignancy, however he had an excision and underwent radiation therapy; he denies any known metastasis and never underwent chemotherapy.  Due to this I find it less likely that malignancy is playing a role, unless there is still underlying occult malignancy present.  He has had periods of long car rides, but his history of VTE's predate this.  Will need to see what hypercoagulable work-up he has had done.  I am unable to see his records from [facility], so we will obtain these first.  Depending on what was previously done, he will likely need work-up for inheritable thrombophilia and antiphospholipid syndrome.   Lastly, patient has significant swelling, induration, increased warmth to touch and pain on the posterior aspect of his left lower extremity.  I am concerned that this is a hematoma given recent surgery involving the left popliteal vein that is likely compressing the nerves leading to numbness in his left lateral foot.  Bedside ultrasound did not identify a hematoma though.  I paged Dr. Donzetta Matters, who is on-call today, to discuss my concerns.  He felt that given symptoms have stabilized now and slowly developed over 6 days, patient is stable enough to wait until appointment tomorrow with vascular surgery.  However if patient develops any acute changes, he would recommend being seen in the ED immediately.  He notes that if patient does end up going to the ED, he will be able to evaluate in person then.  - Continue Eliquis 5 mg twice daily -  Vascular surgery appointment tomorrow at 3:30 PM - Counseled patient that should he develop any changes in his left lower extremity over the next 24 hours, that he should seek urgent evaluation in the ER

## 2020-11-27 NOTE — Assessment & Plan Note (Signed)
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  He underwent excision and radiation therapy but never underwent chemotherapy.  He cannot recall the exact type of skin cancer he had.  Assessment/plan: - We will obtain records from [place].  For now no further intervention required

## 2020-11-27 NOTE — Assessment & Plan Note (Signed)
During recent hospitalization, a right adrenal nodule measuring 1.8 cm was noted.  Radiology recommended a follow-up CT or MRI in 12 months.

## 2020-11-27 NOTE — Progress Notes (Signed)
CC: DVT/PE follow up; establish care  HPI:  [name] is a 60 y.o. with a PMHx as listed below who presents to the clinic for DVT/PE follow up; establish care.   Please see the Encounters tab for problem-based Assessment & Plan regarding status of patient's acute and chronic conditions.  Past Medical History:  - Skin cancer, unknown type, notes it was secondary to RoundUp - DVT - Pneumonia  Past Surgical History -Skin cancer excision (3 years ago) Past Surgical History:  Procedure Laterality Date   APPLICATION OF ANGIOVAC Bilateral 11/15/2020   Procedure: APPLICATION OF ANGIOVAC;  Surgeon: Serafina Mitchell, MD;  Location: [facility] OR;  Service: Vascular;  Laterality: Bilateral;   IVC VENOGRAPHY N/A 11/14/2020   Procedure: IVC Venography;  Surgeon: Marty Heck, MD;  Location: [facility];  Service: Cardiovascular;  Laterality: N/A;   MECHANICAL THROMBECTOMY WITH AORTOGRAM AND INTERVENTION Bilateral 11/15/2020   Procedure: MECHANICAL THROMBECTOMY;  Surgeon: Serafina Mitchell, MD;  Location: [facility] OR;  Service: Vascular;  Laterality: Bilateral;   PERIPHERAL VASCULAR THROMBECTOMY Bilateral 11/14/2020   Procedure: PERIPHERAL VASCULAR THROMBECTOMY;  Surgeon: Marty Heck, MD;  Location: [facility];  Service: Cardiovascular;  Laterality: Bilateral;  With TPA Infusion    ULTRASOUND GUIDANCE FOR VASCULAR ACCESS Bilateral 11/15/2020   Procedure: ULTRASOUND GUIDANCE FOR VASCULAR ACCESS;  Surgeon: Serafina Mitchell, MD;  Location: [facility] OR;  Service: Vascular;  Laterality: Bilateral;   VENA CAVA FILTER PLACEMENT Right 11/15/2020   Procedure: INSERTION VENA-CAVA FILTER;  Surgeon: Serafina Mitchell, MD;  Location[facility] OR;  Service: Vascular;  Laterality: Right;   VENOGRAM Right 11/15/2020   Procedure: VENOGRAM;  Surgeon: Serafina Mitchell, MD;  Location: [facility];  Service: Vascular;  Laterality: Right;   VENOPLASTY Right 11/15/2020   Procedure: VENOPLASTY;  Surgeon: Serafina Mitchell, MD;   Location: [facility] OR;  Service: Vascular;  Laterality: Right;   Past Family History:  - Mother: Acute MI at the age of 9 that resulted in death - Father: Brain cancer - Denies any other family history of blood clot, diabetes, hypertension, hyperlipidemia  Social History:  - Previously lived in [place], but has moved to [place] to begin working at [employer].  He is currently living with his son who is also working at [employer] - Former tobacco user, previously smoked for total of 38 years - Denies regular use of alcohol - Endorses occasional marijuana use for sleep - He has 3 children, 2 sons and 1 daughter - Prior to recent hospitalization, Mr. [place] states he was independent in his ADLs and IADLs  Review of Systems: Review of Systems  Constitutional:  Negative for chills, fever, malaise/fatigue and weight loss.  Respiratory:  Negative for cough, shortness of breath and wheezing.   Cardiovascular:  Positive for leg swelling. Negative for chest pain, palpitations and orthopnea.  Gastrointestinal:  Negative for abdominal pain, constipation, diarrhea, nausea and vomiting.  Musculoskeletal:  Negative for back pain, falls, joint pain and neck pain.  Skin:  Negative for itching and rash.  Neurological:  Positive for sensory change (Numbness in left foot). Negative for dizziness, focal weakness, weakness and headaches.  Psychiatric/Behavioral:  Negative for depression. The patient has insomnia. The patient is not nervous/anxious.    Physical Exam:  Vitals:   11/27/20 1316  BP: (!) 146/92  Pulse: 91  Resp: (!) 28  Temp: 98.6 F (37 C)  TempSrc: Oral  SpO2: 99%  Weight: 223 lb 4.8 oz (  101.3 kg)   Physical Exam Vitals and nursing note reviewed.  Constitutional:      General: He is not in acute distress.    Appearance: He is normal weight.  HENT:     Head: Normocephalic and atraumatic.     Mouth/Throat:     Mouth: Mucous membranes are moist.     Pharynx: Oropharynx is  clear.  Eyes:     Extraocular Movements: Extraocular movements intact.     Conjunctiva/sclera: Conjunctivae normal.     Pupils: Pupils are equal, round, and reactive to light.  Cardiovascular:     Rate and Rhythm: Normal rate and regular rhythm.     Pulses:          Dorsalis pedis pulses are detected w/ Doppler on the left side.       Posterior tibial pulses are detected w/ Doppler on the left side.     Heart sounds: Murmur (Holosystolic, 3 out of 6, heard best at the left upper sternal border) heard.  Pulmonary:     Effort: Pulmonary effort is normal. No respiratory distress.     Breath sounds: No wheezing, rhonchi or rales.     Comments: Decreased breath sounds in the right base Abdominal:     General: Bowel sounds are normal. There is no distension.     Palpations: Abdomen is soft.     Tenderness: There is no abdominal tenderness.  Musculoskeletal:        General: No deformity.     Right lower leg: No edema.     Left lower leg: Swelling (Significant swelling of the left lower extremity compared to the right lower extremity, with induration noted specifically in the posterior aspect) and tenderness (LeftTenderness to palpation on the posterior aspect lower extremity overlying the gastroc) present.     Comments: Bedside ultrasound performed of the left lower extremity did not identify any hematoma  Skin:    General: Skin is warm and dry.     Comments: Increased warmth to touch present in the posterior aspect of the left lower extremity without any overlying erythema.  He does have ecchymosis present in the posterior superior aspect of the left lower extremity that is approximately 4 cm x 2 cm  Neurological:     Mental Status: He is alert and oriented to person, place, and time.     Comments: Numbness present in the lateral aspect of the right foot, involving the fourth and fifth digit  Psychiatric:        Mood and Affect: Mood normal.        Behavior: Behavior normal.        Thought  Content: Thought content normal.        Judgment: Judgment normal.   Assessment & Plan:   See Encounters Tab for problem based charting.  Patient discussed with and seen with Dr. Jimmye Norman

## 2020-11-27 NOTE — Assessment & Plan Note (Signed)
On examination today, Edgar Olson had a 3 out of 6 holosystolic murmur heard best in the left upper sternal border.  He denies any previous history of murmurs as far as he knows.  Patient had an echo obtained at recent visit which did not note any major valvular abnormalities.  - Continue to monitor for now

## 2020-11-27 NOTE — Assessment & Plan Note (Signed)
Edgar Olson denies any previous history of hypertension, but notes that during recent hospitalization, he was told his blood pressure was elevated.  He was not aware that he was started on an antihypertensive, but states he has been taking all the medications he was prescribed on discharge.  Assessment/plan: BP: (!) 146/92  Blood pressure today remains above goal, however given patient did have a prior history of hypertension, we will not make any medication adjustments at this time.  We will review his prior records and see what previous blood pressure readings have been.  - Continue amlodipine 5 mg daily

## 2020-11-27 NOTE — Patient Instructions (Addendum)
It was nice seeing you today! Thank you for choosing [facility] for your Primary Care.    Today we talked about:   Blood clots: I spoke with Dr. Donzetta Matters; his recommendation is to go to the appointment tomorrow since your symptoms have stabilized and are no longer worsening. If anything changes overnight, please go to the ED immediately.  I will check your blood counts today to make sure they are improving. I will call you with the results.  I will get your records from [place]. I may need to order more testing to see why you are clotting so often, however I will wait till your insurance is active.   Continue the Amlodipine for your blood pressure  Given that your back pain has improved, you can stop the Gabapentin.     Let's follow up in 4-6 weeks

## 2020-11-27 NOTE — Assessment & Plan Note (Signed)
Edgar Olson had a recent echo during hospitalization that demonstrated mild abnormal wall motion in the basal, inferoseptum, inferior and inferolateral walls.  Cardiology was consulted who felt it was appropriate for outpatient follow-up.  Patient has appointment scheduled with Dr. Nechama Guard next week.  Of note, patient has a family history of early MI (mother, age 58).  We have no previous records of patient's lipid panels or hypertension history.  We will obtain these for further risk management.

## 2020-11-27 NOTE — Assessment & Plan Note (Signed)
Mr. Kaufman was recently admitted to the hospital from 7/5-7/11.  He presented to the ED with complaints of lower extremity swelling.  CT PE study identified a large linear PE in the distal right pulmonary artery that expanded into the upper and lower lobe branches.  Additional smaller emboli were seen in the left upper lobe.  The RV/LV ratio was 1.4 consistent with a submassive PE.  Since hospital discharge, patient denies any symptoms of shortness of breath, palpitations, hemoptysis.  He notes that he has a history of prior PE for which he was on Xarelto.  Prior to discharge, he was changed from Xarelto to Eliquis due to financial constraint.  Assessment/plan: Physical examination notable for decreased breath sounds in the right lower lobes, however patient is oxygenating well without difficulty.  We discussed the need for lifelong anticoagulation and patient understands.  -Continue Eliquis 5 mg twice daily

## 2020-11-27 NOTE — Assessment & Plan Note (Signed)
During recent hospitalization, CT noted a 3.8 x 1.9 cm fluid-filled cavitary lesion in the right lower lobe concerning for abscess.  PCCM was consulted who did not feel this is consistent with TB.  Recommendations included 3 weeks of Augmentin and outpatient follow-up.  Patient was discharged on Augmentin to complete course.  He has follow-up with pulmonology in August.  Edgar Olson notes that he has a history of severe pneumonia approximately 5 years ago at which time he was told he might have lung cancer, however after extensive imaging was told that was not so.  I wonder if this cavitary lesion could be a complication from this previous pneumonia.  I will obtain records from [place] to see if there is any mention of cavitations.  - Continue Augmentin twice daily to complete 3-week course - Follow-up with pulmonology already scheduled - We will obtain records from [place]

## 2020-11-28 ENCOUNTER — Ambulatory Visit (INDEPENDENT_AMBULATORY_CARE_PROVIDER_SITE_OTHER): Payer: Self-pay | Admitting: Physician Assistant

## 2020-11-28 VITALS — BP 138/87 | HR 73 | Temp 97.9°F | Resp 20 | Ht 73.0 in | Wt 221.2 lb

## 2020-11-28 DIAGNOSIS — I82403 Acute embolism and thrombosis of unspecified deep veins of lower extremity, bilateral: Secondary | ICD-10-CM

## 2020-11-28 LAB — CBC
Hematocrit: 31.4 % — ABNORMAL LOW (ref 37.5–51.0)
Hemoglobin: 10.4 g/dL — ABNORMAL LOW (ref 13.0–17.7)
MCH: 29.1 pg (ref 26.6–33.0)
MCHC: 33.1 g/dL (ref 31.5–35.7)
MCV: 88 fL (ref 79–97)
Platelets: 509 10*3/uL — ABNORMAL HIGH (ref 150–450)
RBC: 3.58 x10E6/uL — ABNORMAL LOW (ref 4.14–5.80)
RDW: 13.5 % (ref 11.6–15.4)
WBC: 5.4 10*3/uL (ref 3.4–10.8)

## 2020-11-28 NOTE — Progress Notes (Signed)
Office Note     CC:  follow up Requesting Provider:  No ref. provider found  HPI: Edgar Olson is a 60 y.o. (August 03, 1960) male who presents for follow up after  1.  Ultrasound-guided access of left popliteal vein 2.  Left leg venogram and venacavogram 3.  Placement of left lower extremity thrombolytics catheter with initiation of chemical thrombolysis including the left popliteal, femoral, common femoral, iliac, and IVC 4.  Ultrasound-guided access of right small saphenous vein 5.  Placement of right lower extremity thrombolytics catheter with initiation of chemical thrombolysis including the right popliteal, femoral, common femoral, iliac and IVC by Dr. Carlis Abbott on 11/14/20. This was followed by #1: Ultrasound-guided access of the right internal jugular vein, left internal jugular vein x2 separate cannulation sites, right common femoral vein                       #2: Ileo-caval venogram                       #3: Venoplasty of the bilateral femoral, common femoral, external iliac, and common iliac veins #4: Mechanical thrombectomy of bilateral common iliac, external iliac, common femoral, femoral veins                  #5: Intravascular ultrasound of bilateral popliteal, femoral, common femoral, external iliac, common iliac veins and inferior vena cava            #6: Removal of inferior vena cava filter            #7: Placement of inferior vena cava filter             #8: Venovenous bypass x87 minutes By Dr. Trula Slade on 11/15/20. He says the Monday following his procedure 7/11 his left calf became very painful and swollen. He says it is worse than the pain he was having in his right leg on presentation to the hospital. He says he has not been able to walk and has has some numbness in his 3rd- 5ht toes. He has been taking the percocet for pain. He was given compression stockings at discharge but he says he cannot tolerate them on his leg as it makes the pain worse. He has been elevating daily. He has  been compliant with his Eliquis.  Past Medical History:  Diagnosis Date   DVT (deep venous thrombosis) ([place])    Hypertension     Past Surgical History:  Procedure Laterality Date   APPLICATION OF ANGIOVAC Bilateral 11/15/2020   Procedure: APPLICATION OF ANGIOVAC;  Surgeon: Serafina Mitchell, MD;  Location: [facility];  Service: Vascular;  Laterality: Bilateral;   IVC VENOGRAPHY N/A 11/14/2020   Procedure: IVC Venography;  Surgeon: Marty Heck, MD;  Location: [facility];  Service: Cardiovascular;  Laterality: N/A;   MECHANICAL THROMBECTOMY WITH AORTOGRAM AND INTERVENTION Bilateral 11/15/2020   Procedure: MECHANICAL THROMBECTOMY;  Surgeon: Serafina Mitchell, MD;  Location: [facility];  Service: Vascular;  Laterality: Bilateral;   PERIPHERAL VASCULAR THROMBECTOMY Bilateral 11/14/2020   Procedure: PERIPHERAL VASCULAR THROMBECTOMY;  Surgeon: Marty Heck, MD;  Location: [facility];  Service: Cardiovascular;  Laterality: Bilateral;  With TPA Infusion    ULTRASOUND GUIDANCE FOR VASCULAR ACCESS Bilateral 11/15/2020   Procedure: ULTRASOUND GUIDANCE FOR VASCULAR ACCESS;  Surgeon: Serafina Mitchell, MD;  Location: [facility];  Service: Vascular;  Laterality: Bilateral;   VENA CAVA FILTER PLACEMENT Right 11/15/2020   Procedure:  INSERTION VENA-CAVA FILTER;  Surgeon: Serafina Mitchell, MD;  Location: [facility] OR;  Service: Vascular;  Laterality: Right;   VENOGRAM Right 11/15/2020   Procedure: VENOGRAM;  Surgeon: Serafina Mitchell, MD;  Location: [facility] OR;  Service: Vascular;  Laterality: Right;   VENOPLASTY Right 11/15/2020   Procedure: VENOPLASTY;  Surgeon: Serafina Mitchell, MD;  Location: [facility];  Service: Vascular;  Laterality: Right;    Social History   Socioeconomic History   Marital status: Single    Spouse name: Not on file   Number of children: Not on file   Years of education: Not on file   Highest education level: Not on file  Occupational History   Not on file  Tobacco Use   Smoking status:  Former    Types: Cigarettes    Quit date: 2018    Years since quitting: 4.5   Smokeless tobacco: Never  Vaping Use   Vaping Use: Never used  Substance and Sexual Activity   Alcohol use: Yes    Alcohol/week: 42.0 standard drinks    Types: 42 Cans of beer per week    Comment: 6 beers a night   Drug use: Yes    Frequency: 1.0 times per week    Types: Marijuana   Sexual activity: Not on file  Other Topics Concern   Not on file  Social History Narrative   Not on file   Social Determinants of Health   Financial Resource Strain: Not on file  Food Insecurity: Not on file  Transportation Needs: Not on file  Physical Activity: Not on file  Stress: Not on file  Social Connections: Not on file  Intimate Partner Violence: Not on file   No family history on file.  Current Outpatient Medications  Medication Sig Dispense Refill   APIXABAN (ELIQUIS) VTE STARTER PACK (10MG  AND 5MG ) Take as directed on package: start with two-5mg  tablets twice daily for 7 days. On day 8, switch to one-5mg  tablet twice daily. 74 each 0   gabapentin (NEURONTIN) 100 MG capsule Take 1 capsule (100 mg total) by mouth 3 (three) times daily. 90 capsule 1   amLODipine (NORVASC) 5 MG tablet Take 1 tablet (5 mg total) by mouth daily. (Patient not taking: Reported on 11/28/2020) 30 tablet 1   amoxicillin-clavulanate (AUGMENTIN) 875-125 MG tablet Take 1 tablet by mouth every 12 (twelve) hours for 16 days. (Patient not taking: Reported on 11/28/2020) 31 tablet 0   cyclobenzaprine (FLEXERIL) 10 MG tablet Take 1 tablet (10 mg total) by mouth 3 (three) times daily as needed for muscle spasms. (Patient not taking: Reported on 11/28/2020) 30 tablet 0   No current facility-administered medications for this visit.    No Known Allergies   REVIEW OF SYSTEMS:  [X]  denotes positive finding, [ ]  denotes negative finding Cardiac  Comments:  Chest pain or chest pressure:    Shortness of breath upon exertion:    Short of breath  when lying flat:    Irregular heart rhythm:        Vascular    Pain in calf, thigh, or hip brought on by ambulation:    Pain in feet at night that wakes you up from your sleep:     Blood clot in your veins:    Leg swelling:  X       Pulmonary    Oxygen at home:    Productive cough:     Wheezing:         Neurologic  Sudden weakness in arms or legs:     Sudden numbness in arms or legs:     Sudden onset of difficulty speaking or slurred speech:    Temporary loss of vision in one eye:     Problems with dizziness:         Gastrointestinal    Blood in stool:     Vomited blood:         Genitourinary    Burning when urinating:     Blood in urine:        Psychiatric    Major depression:         Hematologic    Bleeding problems:    Problems with blood clotting too easily:        Skin    Rashes or ulcers:        Constitutional    Fever or chills:      PHYSICAL EXAMINATION:  Vitals:   11/28/20 1553  BP: 138/87  Pulse: 73  Resp: 20  Temp: 97.9 F (36.6 C)  TempSrc: Temporal  SpO2: 100%  Weight: 221 lb 3.2 oz (100.3 kg)  Height: 6\' 1"  (1.854 m)    General:  WDWN in NAD; vital signs documented above Gait: favoring left leg HENT: WNL, normocephalic Pulmonary: normal non-labored breathing , without wheezing Cardiac: regular HR Vascular Exam/Pulses:2+ femoral pulses, 2 + pedal pulses bilaterally. Feet warm and well perfused. Left calf edematous with ecchymosis extending from popliteal fossa. Firmness and edema of calf possible a hematoma. Popliteal access site with dry eschar. Calf very tight and tender to palpation. Pain with plantar and dorsiflexion. Right calf soft, non tender. No edema. Right popliteal access site suture removed Extremities: without ischemic changes, without Gangrene , without cellulitis; without open wounds;  Musculoskeletal: no muscle wasting or atrophy  Neurologic: A&O X 3;  No focal weakness or paresthesias are detected Psychiatric:  The pt  has Normal affect.  ASSESSMENT/PLAN:: 60 y.o. male here for follow up for thrombolysis, and mechanical thrombectomy of bilateral common iliac, external iliac, common femoral, femoral veins. Removal of inferior vena cava filter. Placement of inferior vena cava filter By Dr. Trula Slade. Patient has had worsening symptoms in LLE since discharge. Likely hematoma in left leg vs. New DVT?Marland Kitchen Unfortunately he was worked in today and did not have any studies done while here. He says he has been compliant with Eliquis.  He has enough Percocet at home to last him through the weekend so I did not give him a new script. Stressed importance of Elevating and wearing compression stockings as well as taking his Eliquis as he has had compliance issues in the past.I will have him return on Monday to see Dr. Trula Slade with IVC/ iliac duplex and LLE DVT duplex   Karoline Caldwell, PA-C [facility] [phone]  Clinic MD:   Laqueta Due

## 2020-11-29 ENCOUNTER — Other Ambulatory Visit: Payer: Self-pay

## 2020-11-29 ENCOUNTER — Other Ambulatory Visit: Payer: Self-pay | Admitting: *Deleted

## 2020-11-29 DIAGNOSIS — I82403 Acute embolism and thrombosis of unspecified deep veins of lower extremity, bilateral: Secondary | ICD-10-CM

## 2020-11-30 NOTE — Progress Notes (Signed)
Internal Medicine Clinic Attending  Case discussed with Dr. Basaraba  At the time of the visit.  We reviewed the resident's history and exam and pertinent patient test results.  I agree with the assessment, diagnosis, and plan of care documented in the resident's note.  

## 2020-12-02 ENCOUNTER — Other Ambulatory Visit: Payer: Self-pay | Admitting: *Deleted

## 2020-12-02 ENCOUNTER — Ambulatory Visit (INDEPENDENT_AMBULATORY_CARE_PROVIDER_SITE_OTHER): Payer: Self-pay | Admitting: Surgery

## 2020-12-02 ENCOUNTER — Encounter: Payer: Self-pay | Admitting: Surgery

## 2020-12-02 ENCOUNTER — Ambulatory Visit (INDEPENDENT_AMBULATORY_CARE_PROVIDER_SITE_OTHER)
Admission: RE | Admit: 2020-12-02 | Discharge: 2020-12-02 | Disposition: A | Discharge status: Home / Self Care | Payer: Self-pay | Source: Ambulatory Visit | Attending: Surgery | Admitting: Surgery

## 2020-12-02 ENCOUNTER — Ambulatory Visit (HOSPITAL_COMMUNITY)
Admission: RE | Admit: 2020-12-02 | Discharge: 2020-12-02 | Disposition: A | Discharge status: Home / Self Care | Payer: Self-pay | Source: Ambulatory Visit | Attending: Surgery | Admitting: Surgery

## 2020-12-02 ENCOUNTER — Other Ambulatory Visit: Payer: Self-pay

## 2020-12-02 VITALS — BP 156/103 | HR 80 | Temp 97.9°F | Resp 20 | Ht 73.0 in | Wt 217.0 lb

## 2020-12-02 DIAGNOSIS — I82403 Acute embolism and thrombosis of unspecified deep veins of lower extremity, bilateral: Secondary | ICD-10-CM

## 2020-12-02 NOTE — Progress Notes (Signed)
[facility]  Patient name: Edgar Olson MRN: [id number] DOB: 01-19-1961 Sex: male   REASON FOR VISIT:    Follow up  HISOTRY OF PRESENT ILLNESS:    Jeremi Kress is a 60 y.o. 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  Most of his treatment was at [facility] in [place].  He states that at that time the never really found out why he clotted everything.  An IVC filter was placed.  He was placed on long-term anticoagulation.  He was originally on Xarelto.  He had no complications from this.  He was recently switched to Eliquis about 5 months ago.  He was traveling from [place] to [place] recently and developed swelling in both of his lower extremities.  He has a family history of his mother dying from some type of "blood clot".  He has no history of diabetes.  He does not know if he has a history of hypercoagulable state.  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  He states that the pathology did not show melanoma but he was concerned that it might be melanoma.  He states he has had a colonoscopy in the past which did not show any abnormalities.  Other medical problems include hypertension which has been stable.  He was a former smoker but quit about 4 5 years ago.  He drinks about a sixpack of beer per day.  He has chronic back pain and was treated for this recently with some type of physical therapy.  He does not recall any prior back operations.  He does not know the exact circumstances surrounding why an IVC filter was placed.  He does not recall have any pulmonary embolus in the past.  He has not had any bleeding complications.  He complains of swelling in both legs and some pain in the left inner thigh.  He does not have any numbness or tingling in his lower extremities.  He does not have any difficulty  walking.   He underwent thrombolysis via Tran popliteal catheters bilaterally, and then mechanical thrombectomy using the angio VAC device and venoplasty.  I was able to clean out the iliofemoral section.  He had what appeared to be chronic thrombus throughout the femoral veins.  He is back today complaining of fullness in his left calf as well as numbness triggers on the front of his foot as well as bilateral heel. PAST MEDICAL HISTORY:   Past Medical History:  Diagnosis Date   DVT (deep venous thrombosis) ([place])    Hypertension      FAMILY HISTORY:   History reviewed. No pertinent family history.  SOCIAL HISTORY:   Social History   Tobacco Use   Smoking status: Former    Types: Cigarettes    Quit date: 2018    Years since quitting: 4.5   Smokeless tobacco: Never  Substance Use Topics   Alcohol use: Yes    Alcohol/week: 42.0 standard drinks    Types: 42 Cans of beer per week    Comment: 6 beers a night     ALLERGIES:   No Known Allergies   CURRENT MEDICATIONS:   Current Outpatient Medications  Medication Sig Dispense Refill   APIXABAN (ELIQUIS) VTE STARTER PACK ('10MG'$  AND '5MG'$ ) Take as directed on package: start with two-'5mg'$  tablets twice daily for 7 days. On day 8, switch to one-'5mg'$   tablet twice daily. 74 each 0   gabapentin (NEURONTIN) 100 MG capsule Take 1 capsule (100 mg total) by mouth 3 (three) times daily. 90 capsule 1   amLODipine (NORVASC) 5 MG tablet Take 1 tablet (5 mg total) by mouth daily. (Patient not taking: No sig reported) 30 tablet 1   amoxicillin-clavulanate (AUGMENTIN) 875-125 MG tablet Take 1 tablet by mouth every 12 (twelve) hours for 16 days. (Patient not taking: No sig reported) 31 tablet 0   cyclobenzaprine (FLEXERIL) 10 MG tablet Take 1 tablet (10 mg total) by mouth 3 (three) times daily as needed for muscle spasms. (Patient not taking: No sig reported) 30 tablet 0   No current facility-administered medications for this visit.    REVIEW OF  SYSTEMS:   '[X]'$  denotes positive finding, '[ ]'$  denotes negative finding Cardiac  Comments:  Chest pain or chest pressure:    Shortness of breath upon exertion:    Short of breath when lying flat:    Irregular heart rhythm:        Vascular    Pain in calf, thigh, or hip brought on by ambulation:    Pain in feet at night that wakes you up from your sleep:     Blood clot in your veins:    Leg swelling:         Pulmonary    Oxygen at home:    Productive cough:     Wheezing:         Neurologic    Sudden weakness in arms or legs:     Sudden numbness in arms or legs:     Sudden onset of difficulty speaking or slurred speech:    Temporary loss of vision in one eye:     Problems with dizziness:         Gastrointestinal    Blood in stool:     Vomited blood:         Genitourinary    Burning when urinating:     Blood in urine:        Psychiatric    Major depression:         Hematologic    Bleeding problems:    Problems with blood clotting too easily:        Skin    Rashes or ulcers:        Constitutional    Fever or chills:      PHYSICAL EXAM:   Vitals:   12/02/20 1348  BP: (!) 156/103  Pulse: 80  Resp: 20  Temp: 97.9 F (36.6 C)  SpO2: 97%  Weight: 217 lb (98.4 kg)  Height: '6\' 1"'$  (1.854 m)    GENERAL: The patient is a well-nourished male, in no acute distress. The vital signs are documented above. CARDIAC: There is a regular rate and rhythm.  VASCULAR: Palpable pedal pulses.  Bilateral edema.  Ecchymosis by the left popliteal cannulation site PULMONARY: Non-labored respirations MUSCULOSKELETAL: There are no major deformities or cyanosis. NEUROLOGIC: No focal weakness or paresthesias are detected. SKIN: There are no ulcers or rashes noted. PSYCHIATRIC: The patient has a normal affect.  STUDIES:   I have reviewed the following: Iliofemoral: IVC/Iliac: There is no evidence of thrombus involving the IVC.  There is no evidence of thrombus involving the right  common iliac vein and  left common iliac vein.  There is no evidence of thrombus involving the right external iliac vein.  There is evidence of age indeterminate thrombus involving the left distal  external iliac vein.  Left leg: - Findings consistent with age indeterminate superficial vein thrombosis  involving the great saphenous vein.  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     MEDICAL ISSUES:   I discussed with the patient that I believe his left leg symptoms are likely from nerve irritation from the access.  There is fullness in the calf which likely represents underlying hematoma.  I suspect both of these will improve with time.  I encouraged him to keep his legs elevated when possible and to use a warm compress to help with some of the fullness in the left calf.  I would like for him to wear compression stockings, however he is unable to tolerate them at this time.  He is going to continue his anticoagulation.  I will see him back in 4 months for further evaluation.  I did discuss the possibility of [place] thrombectomy on the left.  If this is attempted, I will need to make sure that I stay within the main femoral vein.  I was not convinced that this was the case from his popliteal cannulation site due to the multiple collaterals in this area.    Leia Alf, MD, FACS [facility] [phone] Pager [phone]

## 2020-12-05 ENCOUNTER — Ambulatory Visit (HOSPITAL_BASED_OUTPATIENT_CLINIC_OR_DEPARTMENT_OTHER): Payer: Self-pay | Admitting: Cardiology

## 2020-12-12 ENCOUNTER — Encounter: Payer: Self-pay | Admitting: Vascular Surgery

## 2020-12-12 ENCOUNTER — Encounter (HOSPITAL_COMMUNITY): Payer: Self-pay

## 2020-12-17 ENCOUNTER — Inpatient Hospital Stay: Payer: Self-pay | Admitting: Pulmonary Disease

## 2021-02-05 ENCOUNTER — Encounter (HOSPITAL_COMMUNITY): Payer: Self-pay

## 2021-02-05 ENCOUNTER — Other Ambulatory Visit: Payer: Self-pay

## 2021-02-05 ENCOUNTER — Emergency Department (HOSPITAL_COMMUNITY)
Admission: EM | Admit: 2021-02-05 | Discharge: 2021-02-06 | Disposition: A | Discharge status: Home / Self Care | Payer: No Typology Code available for payment source | Attending: Emergency Medicine | Admitting: Emergency Medicine

## 2021-02-05 DIAGNOSIS — M545 Low back pain, unspecified: Secondary | ICD-10-CM | POA: Diagnosis not present

## 2021-02-05 DIAGNOSIS — I10 Essential (primary) hypertension: Secondary | ICD-10-CM | POA: Insufficient documentation

## 2021-02-05 DIAGNOSIS — Z87891 Personal history of nicotine dependence: Secondary | ICD-10-CM | POA: Insufficient documentation

## 2021-02-05 DIAGNOSIS — Z79899 Other long term (current) drug therapy: Secondary | ICD-10-CM | POA: Insufficient documentation

## 2021-02-05 DIAGNOSIS — Z85828 Personal history of other malignant neoplasm of skin: Secondary | ICD-10-CM | POA: Insufficient documentation

## 2021-02-05 DIAGNOSIS — Y92410 Unspecified street and highway as the place of occurrence of the external cause: Secondary | ICD-10-CM | POA: Insufficient documentation

## 2021-02-05 DIAGNOSIS — S299XXA Unspecified injury of thorax, initial encounter: Secondary | ICD-10-CM | POA: Diagnosis present

## 2021-02-05 DIAGNOSIS — Z7901 Long term (current) use of anticoagulants: Secondary | ICD-10-CM | POA: Diagnosis not present

## 2021-02-05 DIAGNOSIS — R0789 Other chest pain: Secondary | ICD-10-CM | POA: Insufficient documentation

## 2021-02-05 DIAGNOSIS — R109 Unspecified abdominal pain: Secondary | ICD-10-CM | POA: Insufficient documentation

## 2021-02-05 DIAGNOSIS — R519 Headache, unspecified: Secondary | ICD-10-CM | POA: Insufficient documentation

## 2021-02-05 DIAGNOSIS — S32040A Wedge compression fracture of fourth lumbar vertebra, initial encounter for closed fracture: Secondary | ICD-10-CM

## 2021-02-05 DIAGNOSIS — S2222XA Fracture of body of sternum, initial encounter for closed fracture: Secondary | ICD-10-CM | POA: Insufficient documentation

## 2021-02-05 DIAGNOSIS — S32010A Wedge compression fracture of first lumbar vertebra, initial encounter for closed fracture: Secondary | ICD-10-CM | POA: Diagnosis not present

## 2021-02-05 LAB — I-STAT CHEM 8, ED
BUN: 13 mg/dL (ref 6–20)
Calcium, Ion: 1.19 mmol/L (ref 1.15–1.40)
Chloride: 102 mmol/L (ref 98–111)
Creatinine, Ser: 0.9 mg/dL (ref 0.61–1.24)
Glucose, Bld: 98 mg/dL (ref 70–99)
HCT: 45 % (ref 39.0–52.0)
Hemoglobin: 15.3 g/dL (ref 13.0–17.0)
Potassium: 4.3 mmol/L (ref 3.5–5.1)
Sodium: 137 mmol/L (ref 135–145)
TCO2: 28 mmol/L (ref 22–32)

## 2021-02-05 MED ORDER — OXYCODONE-ACETAMINOPHEN 5-325 MG PO TABS
1.0000 | ORAL_TABLET | Freq: Once | ORAL | Status: AC
  Administered 2021-02-05: 1 via ORAL
  Filled 2021-02-05: qty 1
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
## 2021-02-06 ENCOUNTER — Emergency Department (HOSPITAL_COMMUNITY): Payer: No Typology Code available for payment source

## 2021-02-06 LAB — COMPREHENSIVE METABOLIC PANEL
ALT: 18 U/L (ref 0–44)
AST: 22 U/L (ref 15–41)
Albumin: 3.9 g/dL (ref 3.5–5.0)
Alkaline Phosphatase: 60 U/L (ref 38–126)
Anion gap: 9 (ref 5–15)
BUN: 9 mg/dL (ref 6–20)
CO2: 25 mmol/L (ref 22–32)
Calcium: 8.9 mg/dL (ref 8.9–10.3)
Chloride: 102 mmol/L (ref 98–111)
Creatinine, Ser: 0.96 mg/dL (ref 0.61–1.24)
GFR, Estimated: >60 mL/min (ref >60)
Glucose, Bld: 104 mg/dL — ABNORMAL HIGH (ref 70–99)
Potassium: 3.6 mmol/L (ref 3.5–5.1)
Sodium: 136 mmol/L (ref 135–145)
Total Bilirubin: 1 mg/dL (ref 0.3–1.2)
Total Protein: 6.4 g/dL — ABNORMAL LOW (ref 6.5–8.1)

## 2021-02-06 LAB — CBC WITH DIFFERENTIAL/PLATELET
Abs Immature Granulocytes: 0.02 10*3/uL (ref 0.00–0.07)
Basophils Absolute: 0 10*3/uL (ref 0.0–0.1)
Basophils Relative: 0 %
Eosinophils Absolute: 0.1 10*3/uL (ref 0.0–0.5)
Eosinophils Relative: 1 %
HCT: 43.2 % (ref 39.0–52.0)
Hemoglobin: 13.8 g/dL (ref 13.0–17.0)
Immature Granulocytes: 0 %
Lymphocytes Relative: 7 %
Lymphs Abs: 0.6 10*3/uL — ABNORMAL LOW (ref 0.7–4.0)
MCH: 28.9 pg (ref 26.0–34.0)
MCHC: 31.9 g/dL (ref 30.0–36.0)
MCV: 90.6 fL (ref 80.0–100.0)
Monocytes Absolute: 0.6 10*3/uL (ref 0.1–1.0)
Monocytes Relative: 7 %
Neutro Abs: 6.7 10*3/uL (ref 1.7–7.7)
Neutrophils Relative %: 85 %
Platelets: 150 10*3/uL (ref 150–400)
RBC: 4.77 MIL/uL (ref 4.22–5.81)
RDW: 15.3 % (ref 11.5–15.5)
WBC: 8 10*3/uL (ref 4.0–10.5)
nRBC: 0 % (ref 0.0–0.2)

## 2021-02-06 MED ORDER — FENTANYL CITRATE PF 50 MCG/ML IJ SOSY
25.0000 ug | PREFILLED_SYRINGE | Freq: Once | INTRAMUSCULAR | Status: AC
Start: 2021-02-06 — End: 2021-02-06
  Administered 2021-02-06: 25 ug via INTRAVENOUS
  Filled 2021-02-06: qty 1

## 2021-02-06 MED ORDER — OXYCODONE HCL 5 MG PO TABS
5.0000 mg | ORAL_TABLET | Freq: Once | ORAL | Status: AC
  Administered 2021-02-06: 5 mg via ORAL
  Filled 2021-02-06: qty 1

## 2021-02-06 MED ORDER — IOHEXOL 350 MG/ML SOLN
70.0000 mL | Freq: Once | INTRAVENOUS | Status: AC | PRN
  Administered 2021-02-06: 70 mL via INTRAVENOUS

## 2021-02-06 MED ORDER — CYCLOBENZAPRINE HCL 10 MG PO TABS: 10.0000 mg | ORAL_TABLET | Freq: Two times a day (BID) | ORAL | 0 refills | Status: DC | PRN

## 2021-02-06 MED ORDER — OXYCODONE HCL 5 MG PO TABS: 5.0000 mg | ORAL_TABLET | ORAL | 0 refills | Status: DC | PRN

## 2021-02-06 MED ORDER — IOHEXOL 350 MG/ML SOLN
75.0000 mL | Freq: Once | INTRAVENOUS | Status: AC | PRN
  Administered 2021-02-06: 75 mL via INTRAVENOUS
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
## 2021-02-06 NOTE — Discharge Instructions (Addendum)
You may take tylenol (acetaminophen) 1000 mg 4 times a day for 1 week. This is the maximum dose of Tylenol you can take from all sources. Please check other over-the-counter medications and prescriptions to ensure you are not taking other medications that contain acetaminophen.  Take oxycodone as needed for breakthrough pain.  This medication can be addicting, sedating and cause constipation.  You have also been given flexeril (muscle relaxant) prescription, this medication can also be sedating.  Wear your lumbar back brace as need for comfort.
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
## 2021-03-19 ENCOUNTER — Emergency Department (HOSPITAL_COMMUNITY): Payer: Self-pay

## 2021-03-19 ENCOUNTER — Emergency Department (HOSPITAL_COMMUNITY)
Admission: EM | Admit: 2021-03-19 | Discharge: 2021-03-20 | Disposition: A | Discharge status: Home / Self Care | Payer: Self-pay | Attending: Emergency Medicine | Admitting: Emergency Medicine

## 2021-03-19 ENCOUNTER — Other Ambulatory Visit: Payer: Self-pay

## 2021-03-19 DIAGNOSIS — I10 Essential (primary) hypertension: Secondary | ICD-10-CM | POA: Insufficient documentation

## 2021-03-19 DIAGNOSIS — Z85828 Personal history of other malignant neoplasm of skin: Secondary | ICD-10-CM | POA: Insufficient documentation

## 2021-03-19 DIAGNOSIS — R531 Weakness: Secondary | ICD-10-CM | POA: Insufficient documentation

## 2021-03-19 DIAGNOSIS — R29810 Facial weakness: Secondary | ICD-10-CM | POA: Insufficient documentation

## 2021-03-19 DIAGNOSIS — S060XAD Concussion with loss of consciousness status unknown, subsequent encounter: Secondary | ICD-10-CM

## 2021-03-19 DIAGNOSIS — R410 Disorientation, unspecified: Secondary | ICD-10-CM | POA: Insufficient documentation

## 2021-03-19 DIAGNOSIS — Z79899 Other long term (current) drug therapy: Secondary | ICD-10-CM | POA: Insufficient documentation

## 2021-03-19 DIAGNOSIS — R569 Unspecified convulsions: Secondary | ICD-10-CM | POA: Insufficient documentation

## 2021-03-19 DIAGNOSIS — Z87891 Personal history of nicotine dependence: Secondary | ICD-10-CM | POA: Insufficient documentation

## 2021-03-19 DIAGNOSIS — R471 Dysarthria and anarthria: Secondary | ICD-10-CM | POA: Insufficient documentation

## 2021-03-19 DIAGNOSIS — Z20822 Contact with and (suspected) exposure to covid-19: Secondary | ICD-10-CM | POA: Insufficient documentation

## 2021-03-19 DIAGNOSIS — Z789 Other specified health status: Secondary | ICD-10-CM

## 2021-03-19 DIAGNOSIS — I639 Cerebral infarction, unspecified: Secondary | ICD-10-CM | POA: Insufficient documentation

## 2021-03-19 LAB — DIFFERENTIAL
Abs Immature Granulocytes: 0.01 10*3/uL (ref 0.00–0.07)
Basophils Absolute: 0 10*3/uL (ref 0.0–0.1)
Basophils Relative: 1 %
Eosinophils Absolute: 0.1 10*3/uL (ref 0.0–0.5)
Eosinophils Relative: 2 %
Immature Granulocytes: 0 %
Lymphocytes Relative: 24 %
Lymphs Abs: 1.3 10*3/uL (ref 0.7–4.0)
Monocytes Absolute: 0.5 10*3/uL (ref 0.1–1.0)
Monocytes Relative: 8 %
Neutro Abs: 3.7 10*3/uL (ref 1.7–7.7)
Neutrophils Relative %: 65 %

## 2021-03-19 LAB — COMPREHENSIVE METABOLIC PANEL
ALT: 16 U/L (ref 0–44)
AST: 29 U/L (ref 15–41)
Albumin: 3.9 g/dL (ref 3.5–5.0)
Alkaline Phosphatase: 81 U/L (ref 38–126)
Anion gap: 15 (ref 5–15)
BUN: 21 mg/dL — ABNORMAL HIGH (ref 6–20)
CO2: 17 mmol/L — ABNORMAL LOW (ref 22–32)
Calcium: 8.9 mg/dL (ref 8.9–10.3)
Chloride: 101 mmol/L (ref 98–111)
Creatinine, Ser: 1.21 mg/dL (ref 0.61–1.24)
GFR, Estimated: >60 mL/min (ref >60)
Glucose, Bld: 103 mg/dL — ABNORMAL HIGH (ref 70–99)
Potassium: 3.8 mmol/L (ref 3.5–5.1)
Sodium: 133 mmol/L — ABNORMAL LOW (ref 135–145)
Total Bilirubin: 0.7 mg/dL (ref 0.3–1.2)
Total Protein: 6.6 g/dL (ref 6.5–8.1)

## 2021-03-19 LAB — CBC
HCT: 45.9 % (ref 39.0–52.0)
Hemoglobin: 14.2 g/dL (ref 13.0–17.0)
MCH: 28.1 pg (ref 26.0–34.0)
MCHC: 30.9 g/dL (ref 30.0–36.0)
MCV: 90.9 fL (ref 80.0–100.0)
Platelets: 194 10*3/uL (ref 150–400)
RBC: 5.05 MIL/uL (ref 4.22–5.81)
RDW: 15 % (ref 11.5–15.5)
WBC: 5.7 10*3/uL (ref 4.0–10.5)
nRBC: 0 % (ref 0.0–0.2)

## 2021-03-19 LAB — I-STAT CHEM 8, ED
BUN: 22 mg/dL — ABNORMAL HIGH (ref 6–20)
Calcium, Ion: 1.1 mmol/L — ABNORMAL LOW (ref 1.15–1.40)
Chloride: 104 mmol/L (ref 98–111)
Creatinine, Ser: 1.1 mg/dL (ref 0.61–1.24)
Glucose, Bld: 104 mg/dL — ABNORMAL HIGH (ref 70–99)
HCT: 45 % (ref 39.0–52.0)
Hemoglobin: 15.3 g/dL (ref 13.0–17.0)
Potassium: 3.8 mmol/L (ref 3.5–5.1)
Sodium: 136 mmol/L (ref 135–145)
TCO2: 18 mmol/L — ABNORMAL LOW (ref 22–32)

## 2021-03-19 LAB — MAGNESIUM: Magnesium: 2.3 mg/dL (ref 1.7–2.4)

## 2021-03-19 LAB — APTT: aPTT: 27 seconds (ref 24–36)

## 2021-03-19 LAB — PROTIME-INR
INR: 1 (ref 0.8–1.2)
Prothrombin Time: 13.3 seconds (ref 11.4–15.2)

## 2021-03-19 LAB — RESP PANEL BY RT-PCR (FLU A&B, COVID) ARPGX2
Influenza A by PCR: NEGATIVE
Influenza B by PCR: NEGATIVE
SARS Coronavirus 2 by RT PCR: NEGATIVE

## 2021-03-19 LAB — CBG MONITORING, ED: Glucose-Capillary: 102 mg/dL — ABNORMAL HIGH (ref 70–99)

## 2021-03-19 LAB — CK: Total CK: 233 U/L (ref 49–397)

## 2021-03-19 LAB — ETHANOL: Alcohol, Ethyl (B): <10 mg/dL (ref <10)

## 2021-03-19 MED ORDER — LACTATED RINGERS IV BOLUS
1000.0000 mL | Freq: Once | INTRAVENOUS | Status: AC
  Administered 2021-03-19: 1000 mL via INTRAVENOUS

## 2021-03-19 MED ORDER — SODIUM CHLORIDE 0.9% FLUSH
3.0000 mL | Freq: Once | INTRAVENOUS | Status: AC
Start: 2021-03-19 — End: 2021-03-19
  Administered 2021-03-19: 3 mL via INTRAVENOUS

## 2021-03-19 MED ORDER — LEVETIRACETAM 500 MG PO TABS
500.0000 mg | ORAL_TABLET | Freq: Two times a day (BID) | ORAL | Status: DC
  Administered 2021-03-19: 500 mg via ORAL
  Filled 2021-03-19: qty 1

## 2021-03-19 MED ORDER — CHLORDIAZEPOXIDE HCL 25 MG PO CAPS
50.0000 mg | ORAL_CAPSULE | Freq: Once | ORAL | Status: AC
  Administered 2021-03-19: 50 mg via ORAL
  Filled 2021-03-19: qty 2

## 2021-03-19 NOTE — ED Notes (Signed)
Code stroke cancelled 
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
## 2021-03-19 NOTE — Progress Notes (Signed)
EEG complete - results pending 

## 2021-03-19 NOTE — ED Notes (Signed)
EEG at bedside.
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
## 2021-03-19 NOTE — Code Documentation (Signed)
Responded to Code Stroke called at 1916 for AMS/R sided weakness/garbled speech s/p witnessed seizure. Pt arrived at [place] with resolution of symptoms, NIH-0, CBG-102, CT head-negative for acute changes.  TNK not given> NIH-0 and pt on eliquis. Code Stroke cancelled at [place]. Plan to tx for seizures.

## 2021-03-19 NOTE — ED Provider Notes (Signed)
[facility] EMERGENCY DEPARTMENT Provider Note   CSN: [id number] Arrival date & time: 03/19/21  1929  An emergency department physician performed an initial assessment on this suspected stroke patient at 14.  History Chief Complaint  Patient presents with   Code Stroke    Edgar Olson is a 60 y.o. male.  HPI Patient presents for altered mental status and strokelike symptoms.  He was reportedly last seen normal at 5 PM by his son.  He had a witnessed seizure.  Following this, he was noted, by EMS, to have left-sided facial droop, right upper extremity weakness, dysarthria, and confusion.  Temporal thermometer showed a fever of 105 degrees.  He was given 1 g of Tylenol.  During transit, patient had improvement in mental status and resolution of LVO symptoms.  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  Currently, he denies any symptoms.  He denies any history of seizures.  He does drink alcohol daily.  He says that this is to help him go to sleep.  His previous daily consumption was 13 beers per day.  He states that recently he has cut this by more than half.  His last alcoholic drink was last night.  He states that, yesterday, he was in his normal state of health.  Today, he did feel some hot flashes.  This was prior to the episode for which EMS was called.     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   Acute pulmonary embolism ([place]) 11/12/2020   Bilateral leg DVT (deep venous thrombosis) ([place]) 11/12/2020   Essential hypertension 11/12/2020    Past Surgical History:  Procedure Laterality Date   APPLICATION OF ANGIOVAC Bilateral  11/15/2020   Procedure: APPLICATION OF ANGIOVAC;  Surgeon: Serafina Mitchell, MD;  Location: [facility];  Service: Vascular;  Laterality: Bilateral;   IVC VENOGRAPHY N/A 11/14/2020   Procedure: IVC Venography;  Surgeon: Marty Heck, MD;  Location: [facility];  Service: Cardiovascular;  Laterality: N/A;   MECHANICAL THROMBECTOMY WITH AORTOGRAM AND INTERVENTION Bilateral 11/15/2020   Procedure: MECHANICAL THROMBECTOMY;  Surgeon: Serafina Mitchell, MD;  Location: [facility];  Service: Vascular;  Laterality: Bilateral;   PERIPHERAL VASCULAR THROMBECTOMY Bilateral 11/14/2020   Procedure: PERIPHERAL VASCULAR THROMBECTOMY;  Surgeon: Marty Heck, MD;  Location: [facility];  Service: Cardiovascular;  Laterality: Bilateral;  With TPA Infusion    ULTRASOUND GUIDANCE FOR VASCULAR ACCESS Bilateral 11/15/2020   Procedure: ULTRASOUND GUIDANCE FOR VASCULAR ACCESS;  Surgeon: Serafina Mitchell, MD;  Location: [facility];  Service: Vascular;  Laterality: Bilateral;   VENA CAVA FILTER PLACEMENT Right 11/15/2020   Procedure: INSERTION VENA-CAVA FILTER;  Surgeon: Serafina Mitchell, MD;  Location: [facility] OR;  Service: Vascular;  Laterality: Right;   VENOGRAM Right 11/15/2020   Procedure: VENOGRAM;  Surgeon: Serafina Mitchell, MD;  Location: [facility];  Service: Vascular;  Laterality: Right;   VENOPLASTY Right 11/15/2020   Procedure: VENOPLASTY;  Surgeon: Serafina Mitchell, MD;  Location: [facility];  Service: Vascular;  Laterality: Right;       No family history on file.  Social History   Tobacco Use   Smoking status: Former    Types: Cigarettes    Quit date: 2018    Years since quitting: 4.8   Smokeless tobacco: Never  Vaping Use   Vaping Use: Never used  Substance Use Topics   Alcohol use: Yes    Alcohol/week: 42.0 standard drinks    Types: 42 Cans of beer per week    Comment: 6 beers a night   Drug use: Yes    Frequency: 1.0 times per week    Types: Marijuana    Home Medications Prior to Admission medications    Medication Sig Start Date End Date Taking? Authorizing Provider  APIXABAN (ELIQUIS) VTE STARTER PACK (10MG  AND 5MG ) Take as directed on package: start with two-5mg  tablets twice daily for 7 days. On day 8, switch to one-5mg  tablet twice daily. Patient taking differently: Take 10 mg by mouth once. Take as directed on package: start with two-5mg  tablets twice daily for 7 days. On day 8, switch to one-5mg  tablet twice daily. 11/18/20  Yes Ghimire, Henreitta Leber, MD  amLODipine (NORVASC) 5 MG tablet Take 1 tablet (5 mg total) by mouth daily. Patient not taking: No sig reported 11/18/20   Jonetta Osgood, MD  cyclobenzaprine (FLEXERIL) 10 MG tablet Take 1 tablet (10 mg total) by mouth 2 (two) times daily as needed for muscle spasms. Patient not taking: Reported on 03/19/2021 02/06/21   Gareth Morgan, MD  gabapentin (NEURONTIN) 100 MG capsule Take 1 capsule (100 mg total) by mouth 3 (three) times daily. Patient not taking: Reported on 03/19/2021 11/18/20   Jonetta Osgood, MD  oxyCODONE (ROXICODONE) 5 MG immediate release tablet Take 1 tablet (5 mg total) by mouth every 4 (four) hours as needed for severe pain. Patient not taking: Reported on 03/19/2021 02/06/21   Gareth Morgan, MD    Allergies    Patient has no known allergies.  Review of Systems   Review of Systems  Constitutional:  Positive for fever. Negative for appetite change and chills.  HENT:  Negative for congestion, ear pain and sore throat.   Eyes:  Negative for pain and visual disturbance.  Respiratory:  Negative for cough, chest tightness and shortness of breath.   Cardiovascular:  Negative for chest pain and palpitations.  Gastrointestinal:  Negative for abdominal pain, diarrhea, nausea and vomiting.  Genitourinary:  Negative for dysuria, flank pain and hematuria.  Musculoskeletal:  Negative for arthralgias, back pain, gait problem, myalgias and neck pain.  Skin:  Negative for color change and rash.  Neurological:  Positive for  seizures and weakness. Negative for dizziness, syncope, light-headedness and headaches.  Hematological:  Bruises/bleeds easily (On Eliquis).  Psychiatric/Behavioral:  Positive for confusion.   All other systems reviewed and are negative.  Physical Exam Updated Vital Signs BP 112/71   Pulse 62   Temp 98 F (36.7 C) (Oral)   Resp 17   SpO2 97%   Physical Exam Vitals and nursing note reviewed.  Constitutional:      General: He is not in acute distress.    Appearance: Normal appearance. He is well-developed and normal weight. He is not ill-appearing, toxic-appearing or diaphoretic.  HENT:     Head: Normocephalic and atraumatic.     Right Ear: External ear normal.     Left Ear: External ear normal.     Nose: Nose normal.     Mouth/Throat:     Mouth: Mucous membranes are moist.     Pharynx:  Oropharynx is clear.     Comments: No tongue bite Eyes:     General: No scleral icterus.    Extraocular Movements: Extraocular movements intact.     Conjunctiva/sclera: Conjunctivae normal.  Cardiovascular:     Rate and Rhythm: Normal rate and regular rhythm.     Heart sounds: No murmur heard. Pulmonary:     Effort: Pulmonary effort is normal. No respiratory distress.     Breath sounds: Normal breath sounds. No wheezing or rales.  Abdominal:     Palpations: Abdomen is soft.     Tenderness: There is no abdominal tenderness.  Musculoskeletal:        General: Normal range of motion.     Cervical back: Normal range of motion and neck supple. No rigidity.     Right lower leg: No edema.     Left lower leg: No edema.  Skin:    General: Skin is warm and dry.     Coloration: Skin is not jaundiced or pale.  Neurological:     General: No focal deficit present.     Mental Status: He is alert and oriented to person, place, and time.     Cranial Nerves: No cranial nerve deficit.     Sensory: No sensory deficit.     Motor: No weakness.     Coordination: Coordination normal.  Psychiatric:         Mood and Affect: Mood normal.        Behavior: Behavior normal.        Thought Content: Thought content normal.        Judgment: Judgment normal.    ED Results / Procedures / Treatments   Labs (all labs ordered are listed, but only abnormal results are displayed) Labs Reviewed  COMPREHENSIVE METABOLIC PANEL - Abnormal; Notable for the following components:      Result Value   Sodium 133 (*)    CO2 17 (*)    Glucose, Bld 103 (*)    BUN 21 (*)    All other components within normal limits  I-STAT CHEM 8, ED - Abnormal; Notable for the following components:   BUN 22 (*)    Glucose, Bld 104 (*)    Calcium, Ion 1.10 (*)    TCO2 18 (*)    All other components within normal limits  CBG MONITORING, ED - Abnormal; Notable for the following components:   Glucose-Capillary 102 (*)    All other components within normal limits  RESP PANEL BY RT-PCR (FLU A&B, COVID) ARPGX2  PROTIME-INR  APTT  CBC  DIFFERENTIAL  ETHANOL  CK  MAGNESIUM    EKG None  Radiology CT HEAD CODE STROKE WO CONTRAST  Result Date: 03/19/2021 CLINICAL DATA:  Code stroke. Right-sided weakness, seizure, unknown last known normal. EXAM: CT HEAD WITHOUT CONTRAST TECHNIQUE: Contiguous axial images were obtained from the base of the skull through the vertex without intravenous contrast. COMPARISON:  02/06/2021. FINDINGS: Brain: No evidence of acute infarction, hemorrhage, cerebral edema, mass, mass effect, or midline shift. Ventricles and sulci are normal for age. No extra-axial fluid collection. Vascular: No hyperdense vessel or unexpected calcification. Skull: Normal. Negative for fracture or focal lesion. Sinuses/Orbits: No acute finding. Other: The mastoid air cells are well aerated. ASPECTS [facility] Stroke Program Early CT Score) - Ganglionic level infarction (caudate, lentiform nuclei, internal capsule, insula, M1-M3 cortex): 7 - Supraganglionic infarction (M4-M6 cortex): 3 Total score (0-10 with 10 being normal): 10  IMPRESSION: 1. No acute intracranial process. 2.  ASPECTS is 10 Code stroke imaging results were communicated on 03/19/2021 at 7:52 pm to provider Dr. Lorrin Goodell via secure text paging. Electronically Signed   By: Merilyn Baba M.D.   On: 03/19/2021 19:53    Procedures Procedures   Medications Ordered in ED Medications  levETIRAcetam (KEPPRA) tablet 500 mg (500 mg Oral Given 03/19/21 2143)  sodium chloride flush (NS) 0.9 % injection 3 mL (3 mLs Intravenous Given 03/19/21 2004)  lactated ringers bolus 1,000 mL (0 mLs Intravenous Stopped 03/19/21 2148)  chlordiazePOXIDE (LIBRIUM) capsule 50 mg (50 mg Oral Given 03/19/21 2035)    ED Course  I have reviewed the triage vital signs and the nursing notes.  Pertinent labs & imaging results that were available during my care of the patient were reviewed by me and considered in my medical decision making (see chart for details).    MDM Rules/Calculators/A&P                          Patient presents for strokelike symptoms.  This was following a seizure episode.  Patient has no history of seizures but does have a chronic, heavy alcohol consumption, for which she has recently reduced by more than half.  Seizure could be in the setting of alcohol withdrawal.  He also states that he felt hot flashes and EMS did report a temporal temperature of 105 degrees.  Seizure has defervesced following dose of Tylenol prior to arrival.  Given his return to mental baseline, low suspicion for meningitis.  Patient initially went to CT scan where noncontrasted study showed no acute findings.  Code stroke was canceled.  Neurology does recommend an EEG and MRI.  IV fluids were ordered.  Patient was given dose of Librium for prevention of any mitigation of alcohol withdrawal symptoms.  Lab work shows no leukocytosis, further lowering suspicion of meningitis.  Electrolytes are normal.  I spoke with the patient's son who describes a tonic-clonic seizure with arms stiffening.  This  occurred while the patient was laying in bed.  Patient's son estimated that duration of seizure was 1 minute.  Patient son does confirm that the patient is currently at his mental baseline.  Neurology recommendations are as follows: Patient to undergo EEG and MRI studies here in the ED.  Patient also to be started on 500 mg Keppra twice daily.  He was advised to follow-up in the neurology clinic and to take seizure precautions, including not driving until he is able to follow-up.  Patient had no further symptoms while in the ED.  At time of signout, results of EEG and MRI studies were pending.  Care of patient was signed out to oncoming ED provider.  Final Clinical Impression(s) / ED Diagnoses Final diagnoses:  Seizure [facility])    Rx / [place] Orders ED Discharge Orders     None        Godfrey Pick, MD 03/20/21 0041

## 2021-03-20 ENCOUNTER — Emergency Department (HOSPITAL_COMMUNITY): Payer: Self-pay

## 2021-03-20 MED ORDER — APIXABAN 5 MG PO TABS: 5.0000 mg | ORAL_TABLET | Freq: Two times a day (BID) | ORAL | 3 refills | Status: DC

## 2021-03-20 MED ORDER — LEVETIRACETAM 500 MG PO TABS: 500.0000 mg | ORAL_TABLET | Freq: Two times a day (BID) | ORAL | 2 refills | Status: DC

## 2021-03-20 NOTE — Discharge Instructions (Signed)
Seizure precautions: °Per [place] DMV statutes, patients with seizures are not allowed to drive until they have been seizure-free for six months and cleared by a physician  °  °Use caution when using heavy equipment or power tools. Avoid working on ladders or at heights. Take showers instead of baths. Ensure the water temperature is not too high on the home water heater. Do not go swimming alone. Do not lock yourself in a room alone (i.e. bathroom). When caring for infants or small children, sit down when holding, feeding, or changing them to minimize risk of injury to the child in the event you have a seizure. Maintain good sleep hygiene. Avoid alcohol.  °  °If patient has another seizure, call 911 and bring them back to the ED if: °A.  The seizure lasts longer than 5 minutes.      °B.  The patient doesn't wake shortly after the seizure or has new problems such as difficulty seeing, speaking or moving following the seizure °C.  The patient was injured during the seizure °D.  The patient has a temperature over 102 F (39C) °E.  The patient vomited during the seizure and now is having trouble breathing °   °During the Seizure °  °- First, ensure adequate ventilation and place patients on the floor on their left side  °Loosen clothing around the neck and ensure the airway is patent. If the patient is clenching the teeth, do not force the mouth open with any object as this can cause severe damage °- Remove all items from the surrounding that can be hazardous. The patient may be oblivious to what's happening and may not even know what he or she is doing. °If the patient is confused and wandering, either gently guide him/her away and block access to outside areas °- Reassure the individual and be comforting °- Call 911. In most cases, the seizure ends before EMS arrives. However, there are cases when seizures may last over 3 to 5 minutes. Or the individual may have developed breathing difficulties or severe  injuries. If a pregnant patient or a person with diabetes develops a seizure, it is prudent to call an ambulance. °- Finally, if the patient does not regain full consciousness, then call EMS. Most patients will remain confused for about 45 to 90 minutes after a seizure, so you must use judgment in calling for help. °- Avoid restraints but make sure the patient is in a bed with padded side rails °- Place the individual in a lateral position with the neck slightly flexed; this will help the saliva drain from the mouth and prevent the tongue from falling backward °- Remove all nearby furniture and other hazards from the area °- Provide verbal assurance as the individual is regaining consciousness °- Provide the patient with privacy if possible °- Call for help and start treatment as ordered by the caregiver °  ° After the Seizure (Postictal Stage) °  °After a seizure, most patients experience confusion, fatigue, muscle pain and/or a headache. Thus, one should permit the individual to sleep. For the next few days, reassurance is essential. Being calm and helping reorient the person is also of importance. °  °Most seizures are painless and end spontaneously. Seizures are not harmful to others but can lead to complications such as stress on the lungs, brain and the heart. Individuals with prior lung problems may develop labored breathing and respiratory distress.  °  °

## 2021-03-20 NOTE — ED Notes (Signed)
Pt ambulatory to restroom
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
## 2021-04-07 ENCOUNTER — Ambulatory Visit: Payer: Self-pay | Admitting: Surgery

## 2021-08-26 ENCOUNTER — Emergency Department (HOSPITAL_COMMUNITY): Payer: Worker's Compensation

## 2021-08-26 ENCOUNTER — Other Ambulatory Visit: Payer: Self-pay

## 2021-08-26 ENCOUNTER — Emergency Department (HOSPITAL_COMMUNITY)
Admission: EM | Admit: 2021-08-26 | Discharge: 2021-08-26 | Disposition: A | Discharge status: Home / Self Care | Payer: Worker's Compensation | Attending: Emergency Medicine | Admitting: Emergency Medicine

## 2021-08-26 ENCOUNTER — Encounter (HOSPITAL_COMMUNITY): Payer: Self-pay

## 2021-08-26 DIAGNOSIS — S20211A Contusion of right front wall of thorax, initial encounter: Secondary | ICD-10-CM | POA: Diagnosis not present

## 2021-08-26 DIAGNOSIS — S299XXA Unspecified injury of thorax, initial encounter: Secondary | ICD-10-CM | POA: Diagnosis present

## 2021-08-26 DIAGNOSIS — Y92410 Unspecified street and highway as the place of occurrence of the external cause: Secondary | ICD-10-CM | POA: Insufficient documentation

## 2021-08-26 DIAGNOSIS — S0990XA Unspecified injury of head, initial encounter: Secondary | ICD-10-CM | POA: Insufficient documentation

## 2021-08-26 DIAGNOSIS — S8011XA Contusion of right lower leg, initial encounter: Secondary | ICD-10-CM | POA: Diagnosis not present

## 2021-08-26 DIAGNOSIS — S301XXA Contusion of abdominal wall, initial encounter: Secondary | ICD-10-CM | POA: Insufficient documentation

## 2021-08-26 DIAGNOSIS — Z7901 Long term (current) use of anticoagulants: Secondary | ICD-10-CM | POA: Insufficient documentation

## 2021-08-26 DIAGNOSIS — S8012XA Contusion of left lower leg, initial encounter: Secondary | ICD-10-CM | POA: Diagnosis not present

## 2021-08-26 DIAGNOSIS — T07XXXA Unspecified multiple injuries, initial encounter: Secondary | ICD-10-CM

## 2021-08-26 LAB — CBC
HCT: 44 % (ref 39.0–52.0)
Hemoglobin: 14.8 g/dL (ref 13.0–17.0)
MCH: 31 pg (ref 26.0–34.0)
MCHC: 33.6 g/dL (ref 30.0–36.0)
MCV: 92.1 fL (ref 80.0–100.0)
Platelets: 192 10*3/uL (ref 150–400)
RBC: 4.78 MIL/uL (ref 4.22–5.81)
RDW: 13.1 % (ref 11.5–15.5)
WBC: 6.4 10*3/uL (ref 4.0–10.5)
nRBC: 0 % (ref 0.0–0.2)

## 2021-08-26 LAB — ETHANOL: Alcohol, Ethyl (B): 93 mg/dL — ABNORMAL HIGH (ref <10)

## 2021-08-26 LAB — COMPREHENSIVE METABOLIC PANEL
ALT: 31 U/L (ref 0–44)
AST: 41 U/L (ref 15–41)
Albumin: 3.7 g/dL (ref 3.5–5.0)
Alkaline Phosphatase: 53 U/L (ref 38–126)
Anion gap: 10 (ref 5–15)
BUN: 6 mg/dL (ref 6–20)
CO2: 21 mmol/L — ABNORMAL LOW (ref 22–32)
Calcium: 8.3 mg/dL — ABNORMAL LOW (ref 8.9–10.3)
Chloride: 103 mmol/L (ref 98–111)
Creatinine, Ser: 0.81 mg/dL (ref 0.61–1.24)
GFR, Estimated: >60 mL/min (ref >60)
Glucose, Bld: 102 mg/dL — ABNORMAL HIGH (ref 70–99)
Potassium: 3.8 mmol/L (ref 3.5–5.1)
Sodium: 134 mmol/L — ABNORMAL LOW (ref 135–145)
Total Bilirubin: 0.7 mg/dL (ref 0.3–1.2)
Total Protein: 5.9 g/dL — ABNORMAL LOW (ref 6.5–8.1)

## 2021-08-26 LAB — LACTIC ACID, PLASMA: Lactic Acid, Venous: 2.2 mmol/L (ref 0.5–1.9)

## 2021-08-26 LAB — SAMPLE TO BLOOD BANK

## 2021-08-26 LAB — PROTIME-INR
INR: 1.1 (ref 0.8–1.2)
Prothrombin Time: 13.9 seconds (ref 11.4–15.2)

## 2021-08-26 MED ORDER — HYDROMORPHONE HCL 1 MG/ML IJ SOLN
1.0000 mg | Freq: Once | INTRAMUSCULAR | Status: AC
  Administered 2021-08-26: 1 mg via INTRAVENOUS
  Filled 2021-08-26: qty 1

## 2021-08-26 MED ORDER — FENTANYL CITRATE PF 50 MCG/ML IJ SOSY
100.0000 ug | PREFILLED_SYRINGE | INTRAMUSCULAR | Status: DC | PRN
  Administered 2021-08-26 (×2): 100 ug via INTRAVENOUS
  Filled 2021-08-26 (×3): qty 2

## 2021-08-26 MED ORDER — OXYCODONE-ACETAMINOPHEN 5-325 MG PO TABS: 1.0000 | ORAL_TABLET | Freq: Four times a day (QID) | ORAL | 0 refills | Status: DC | PRN

## 2021-08-26 MED ORDER — IOHEXOL 300 MG/ML  SOLN
100.0000 mL | Freq: Once | INTRAMUSCULAR | Status: AC | PRN
  Administered 2021-08-26: 100 mL via INTRAVENOUS

## 2021-08-26 MED ORDER — ONDANSETRON HCL 4 MG/2ML IJ SOLN
4.0000 mg | Freq: Once | INTRAMUSCULAR | Status: AC
  Administered 2021-08-26: 4 mg via INTRAVENOUS
  Filled 2021-08-26: qty 2

## 2021-08-26 MED ORDER — SODIUM CHLORIDE 0.9 % IV BOLUS
125.0000 mL | Freq: Once | INTRAVENOUS | Status: AC
  Administered 2021-08-26: 125 mL via INTRAVENOUS

## 2021-08-26 NOTE — ED Notes (Signed)
Pt alert, NAD, calm, interactive, resps e/u, speaking in clear complete sentences. Denies other sx or complaints.  ?
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
## 2021-08-26 NOTE — ED Notes (Signed)
Patient transported to CT 

## 2021-08-26 NOTE — Discharge Instructions (Signed)
Use ice on the sore spots 3-4 times a day for 30 minutes, after that heat can help.  Rest is much as possible.  [place] advance your activity as tolerated.  Follow-up with the doctor of your choice as needed for problems.  Follow-up with your orthopedic doctor if needed for persistent pain. ?
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
## 2021-08-26 NOTE — ED Notes (Signed)
RN sent down a repeat cmp per labs request  ?

## 2021-08-26 NOTE — ED Notes (Signed)
Pt is axox4 and states he does not need to use to use the bathroom at this time.  ?
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
## 2021-08-26 NOTE — ED Notes (Signed)
Pt is in xray at this time 

## 2021-09-01 LAB — I-STAT CHEM 8, ED
BUN: 8 mg/dL (ref 6–20)
Calcium, Ion: 1.01 mmol/L — ABNORMAL LOW (ref 1.15–1.40)
Chloride: 97 mmol/L — ABNORMAL LOW (ref 98–111)
Creatinine, Ser: 1 mg/dL (ref 0.61–1.24)
Glucose, Bld: 91 mg/dL (ref 70–99)
HCT: 46 % (ref 39.0–52.0)
Hemoglobin: 15.6 g/dL (ref 13.0–17.0)
Potassium: 6.5 mmol/L (ref 3.5–5.1)
Sodium: 129 mmol/L — ABNORMAL LOW (ref 135–145)
TCO2: 26 mmol/L (ref 22–32)

## 2021-11-14 ENCOUNTER — Telehealth: Payer: Self-pay

## 2021-11-14 NOTE — Telephone Encounter (Signed)
This RNCM received inbound call from patient questioning assistance with his insurance coverage. Patient states he needs wsomething from the MD to state he needs to take Eliquis for the remainder of his life. This RNCM advised patient to contact his PCP as they would be able to assist with that. This is [facility] ED and we can not assist with his request. No additional TOC needs at this time.

## 2022-09-26 IMAGING — CR DG ABDOMEN 1V
2 series · 2 of 2 positions shown · non-contrast
Comparison: None.

CLINICAL DATA: Encounter for IVC location. Patient reports IVC
filter was placed 2 years ago.

EXAM:
ABDOMEN - 1 VIEW

[x abdomen supine (1 of 2)]
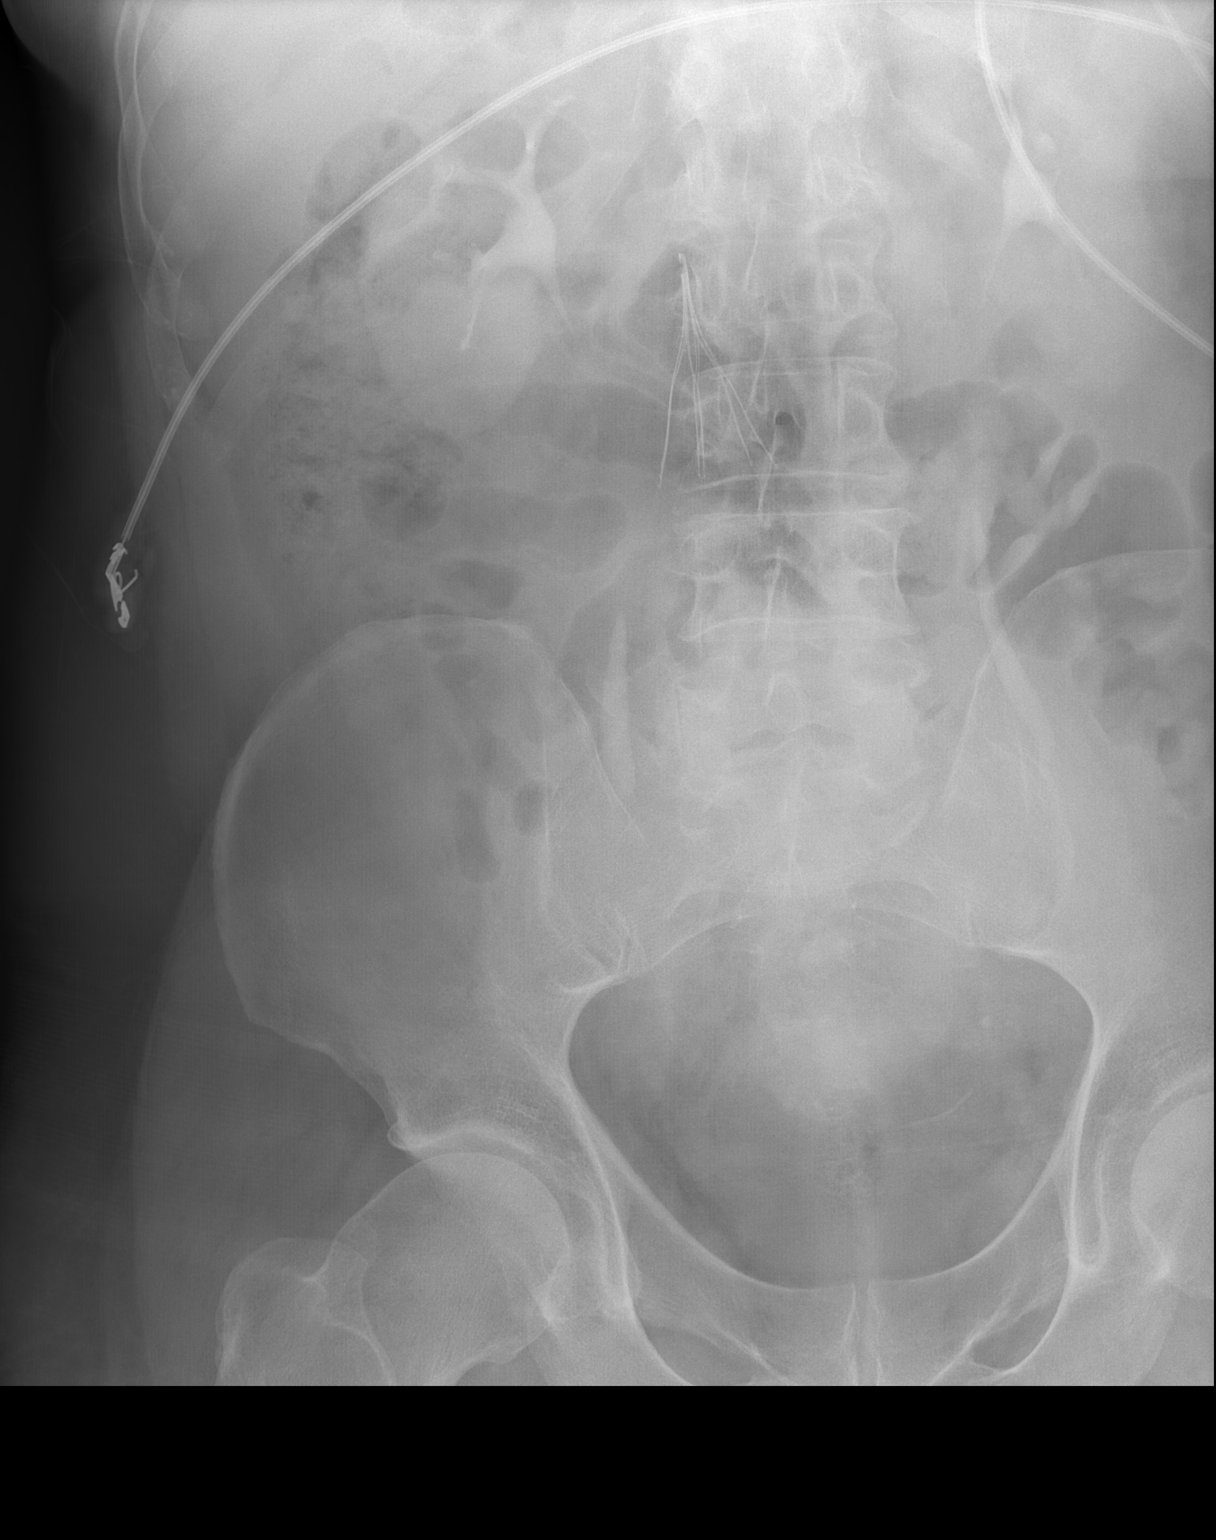

[x abdomen supine (2 of 2)]
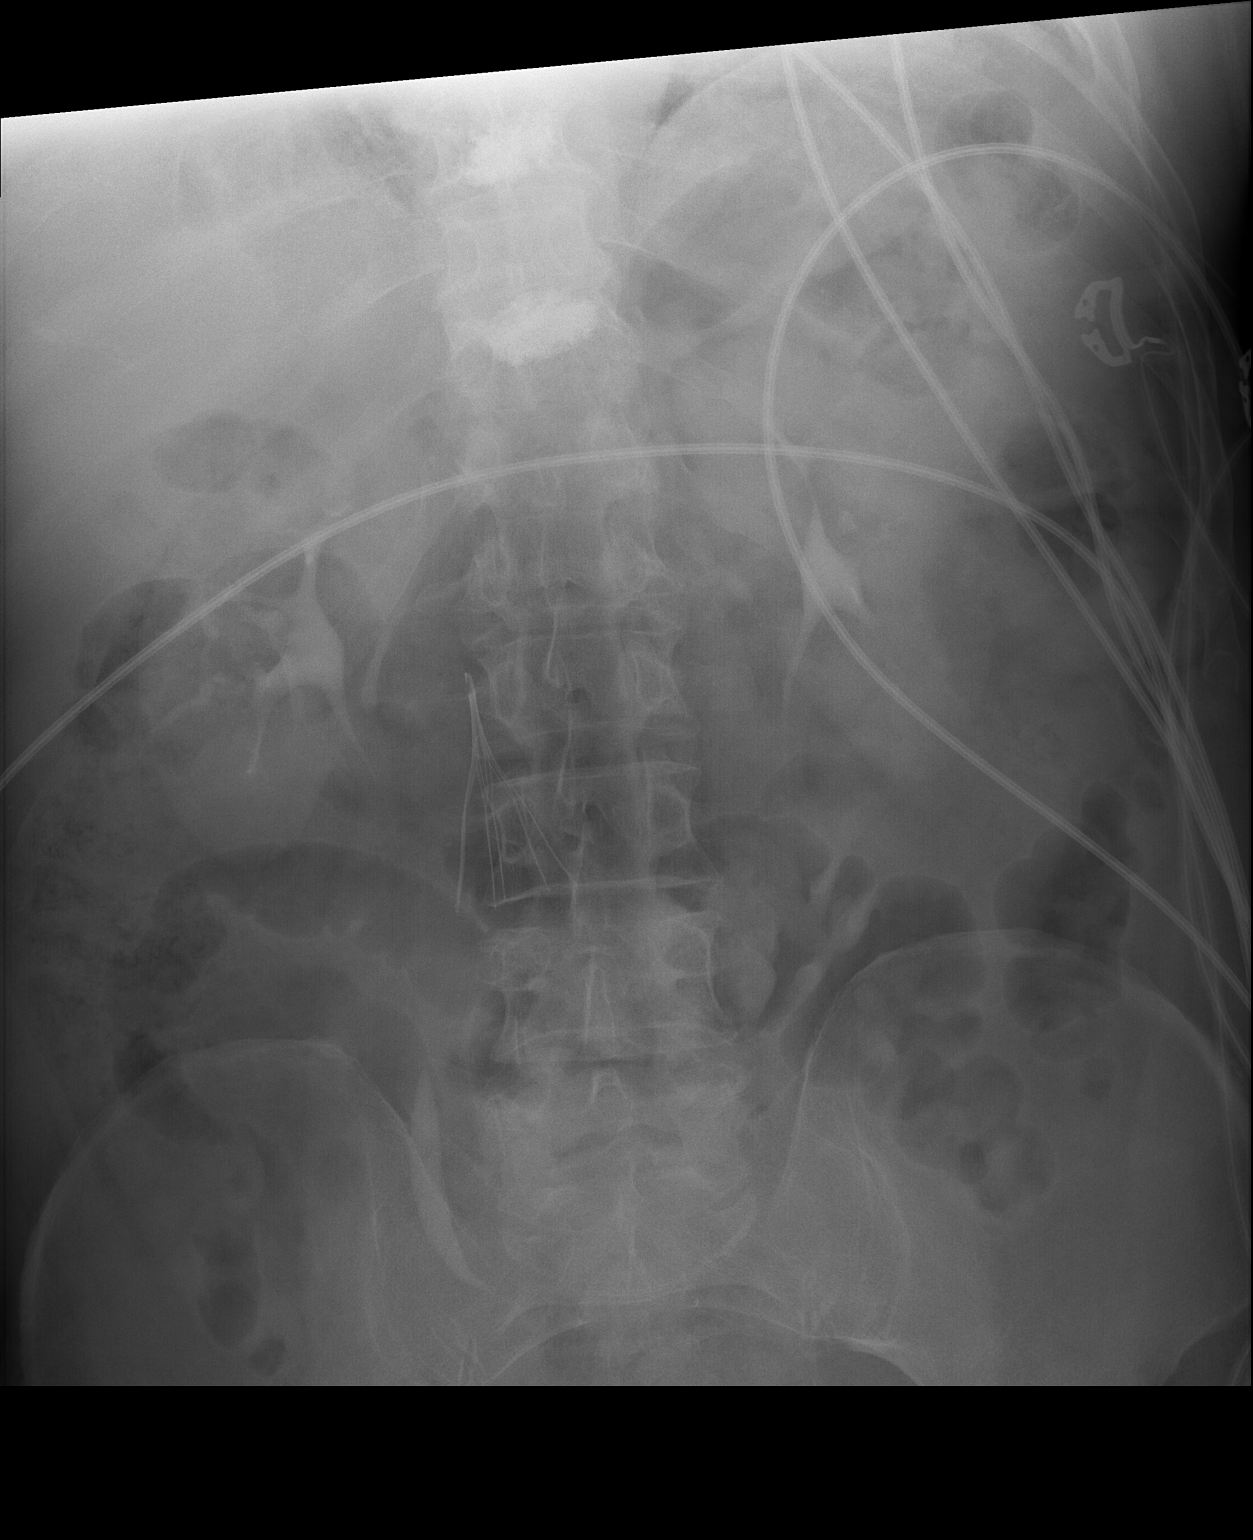

[2 of 2 positions shown; findings below may reference images not displayed]

FINDINGS: IVC filter is seen to the right of L2-L3. normal visualized bowel
gas pattern. Excreted IV contrast within both renal collecting
systems from prior CT. Kyphoplasty within lower thoracic vertebra,
partially included.
IMPRESSION: IVC filter to the right of L2-L3.

## 2022-12-11 ENCOUNTER — Ambulatory Visit
Admission: EM | Admit: 2022-12-11 | Discharge: 2022-12-11 | Disposition: A | Discharge status: Home / Self Care | Payer: Medicare Other

## 2022-12-11 NOTE — ED Provider Notes (Signed)
Patient advised to go to ED. Patient actively vomiting  x 5 days. Given age, duration of symptoms, patient warrants emergent work-up in ED. Patient became upset and threw mask and vomit bag and  patient left office.   Bing Neighbors, NP 12/11/22 1721

## 2022-12-13 ENCOUNTER — Other Ambulatory Visit: Payer: Self-pay

## 2022-12-13 ENCOUNTER — Emergency Department (HOSPITAL_COMMUNITY): Payer: Medicare Other

## 2022-12-13 ENCOUNTER — Encounter (HOSPITAL_COMMUNITY): Payer: Self-pay

## 2022-12-13 ENCOUNTER — Inpatient Hospital Stay (HOSPITAL_COMMUNITY)
Admission: EM | Admit: 2022-12-13 | Discharge: 2022-12-18 | DRG: 682 | Disposition: A | Discharge status: Home / Self Care | Payer: Medicare Other | Attending: Internal Medicine | Admitting: Internal Medicine

## 2022-12-13 ENCOUNTER — Emergency Department (HOSPITAL_COMMUNITY): Payer: Self-pay

## 2022-12-13 DIAGNOSIS — F1010 Alcohol abuse, uncomplicated: Secondary | ICD-10-CM | POA: Diagnosis not present

## 2022-12-13 DIAGNOSIS — K264 Chronic or unspecified duodenal ulcer with hemorrhage: Secondary | ICD-10-CM | POA: Diagnosis present

## 2022-12-13 DIAGNOSIS — R0789 Other chest pain: Secondary | ICD-10-CM | POA: Diagnosis not present

## 2022-12-13 DIAGNOSIS — N179 Acute kidney failure, unspecified: Secondary | ICD-10-CM | POA: Diagnosis not present

## 2022-12-13 DIAGNOSIS — E871 Hypo-osmolality and hyponatremia: Secondary | ICD-10-CM | POA: Diagnosis present

## 2022-12-13 DIAGNOSIS — D62 Acute posthemorrhagic anemia: Secondary | ICD-10-CM | POA: Diagnosis present

## 2022-12-13 DIAGNOSIS — R197 Diarrhea, unspecified: Secondary | ICD-10-CM | POA: Diagnosis not present

## 2022-12-13 DIAGNOSIS — Z5941 Food insecurity: Secondary | ICD-10-CM | POA: Diagnosis not present

## 2022-12-13 DIAGNOSIS — Z1152 Encounter for screening for COVID-19: Secondary | ICD-10-CM

## 2022-12-13 DIAGNOSIS — Z95828 Presence of other vascular implants and grafts: Secondary | ICD-10-CM | POA: Diagnosis not present

## 2022-12-13 DIAGNOSIS — N200 Calculus of kidney: Secondary | ICD-10-CM | POA: Diagnosis present

## 2022-12-13 DIAGNOSIS — I10 Essential (primary) hypertension: Secondary | ICD-10-CM | POA: Diagnosis not present

## 2022-12-13 DIAGNOSIS — D649 Anemia, unspecified: Secondary | ICD-10-CM

## 2022-12-13 DIAGNOSIS — K269 Duodenal ulcer, unspecified as acute or chronic, without hemorrhage or perforation: Secondary | ICD-10-CM

## 2022-12-13 DIAGNOSIS — Z87891 Personal history of nicotine dependence: Secondary | ICD-10-CM

## 2022-12-13 DIAGNOSIS — M79661 Pain in right lower leg: Secondary | ICD-10-CM | POA: Diagnosis not present

## 2022-12-13 DIAGNOSIS — I82503 Chronic embolism and thrombosis of unspecified deep veins of lower extremity, bilateral: Secondary | ICD-10-CM | POA: Diagnosis present

## 2022-12-13 DIAGNOSIS — Z86711 Personal history of pulmonary embolism: Secondary | ICD-10-CM | POA: Diagnosis not present

## 2022-12-13 DIAGNOSIS — K279 Peptic ulcer, site unspecified, unspecified as acute or chronic, without hemorrhage or perforation: Secondary | ICD-10-CM

## 2022-12-13 DIAGNOSIS — F1090 Alcohol use, unspecified, uncomplicated: Secondary | ICD-10-CM | POA: Diagnosis not present

## 2022-12-13 DIAGNOSIS — Z85828 Personal history of other malignant neoplasm of skin: Secondary | ICD-10-CM | POA: Diagnosis not present

## 2022-12-13 DIAGNOSIS — K254 Chronic or unspecified gastric ulcer with hemorrhage: Secondary | ICD-10-CM | POA: Diagnosis present

## 2022-12-13 DIAGNOSIS — Z7901 Long term (current) use of anticoagulants: Secondary | ICD-10-CM | POA: Diagnosis not present

## 2022-12-13 DIAGNOSIS — K852 Alcohol induced acute pancreatitis without necrosis or infection: Secondary | ICD-10-CM

## 2022-12-13 DIAGNOSIS — K259 Gastric ulcer, unspecified as acute or chronic, without hemorrhage or perforation: Secondary | ICD-10-CM

## 2022-12-13 DIAGNOSIS — Z79899 Other long term (current) drug therapy: Secondary | ICD-10-CM | POA: Diagnosis not present

## 2022-12-13 DIAGNOSIS — R112 Nausea with vomiting, unspecified: Secondary | ICD-10-CM | POA: Diagnosis not present

## 2022-12-13 DIAGNOSIS — E860 Dehydration: Secondary | ICD-10-CM | POA: Diagnosis present

## 2022-12-13 LAB — COMPREHENSIVE METABOLIC PANEL
ALT: 18 U/L (ref 0–44)
AST: 18 U/L (ref 15–41)
Albumin: 3.4 g/dL — ABNORMAL LOW (ref 3.5–5.0)
Alkaline Phosphatase: 37 U/L — ABNORMAL LOW (ref 38–126)
Anion gap: 14 (ref 5–15)
BUN: 129 mg/dL — ABNORMAL HIGH (ref 6–20)
CO2: 16 mmol/L — ABNORMAL LOW (ref 22–32)
Calcium: 8.7 mg/dL — ABNORMAL LOW (ref 8.9–10.3)
Chloride: 95 mmol/L — ABNORMAL LOW (ref 98–111)
Creatinine, Ser: 8.8 mg/dL — ABNORMAL HIGH (ref 0.61–1.24)
GFR, Estimated: 6 mL/min — ABNORMAL LOW (ref >60)
Glucose, Bld: 155 mg/dL — ABNORMAL HIGH (ref 70–99)
Potassium: 4.4 mmol/L (ref 3.5–5.1)
Sodium: 125 mmol/L — ABNORMAL LOW (ref 135–145)
Total Bilirubin: 0.7 mg/dL (ref 0.3–1.2)
Total Protein: 5.8 g/dL — ABNORMAL LOW (ref 6.5–8.1)

## 2022-12-13 LAB — URINALYSIS, ROUTINE W REFLEX MICROSCOPIC
Bilirubin Urine: NEGATIVE
Glucose, UA: NEGATIVE mg/dL
Ketones, ur: NEGATIVE mg/dL
Leukocytes,Ua: NEGATIVE
Nitrite: NEGATIVE
Protein, ur: NEGATIVE mg/dL
Specific Gravity, Urine: 1.008 (ref 1.005–1.030)
pH: 5 (ref 5.0–8.0)

## 2022-12-13 LAB — RENAL FUNCTION PANEL
Albumin: 3.3 g/dL — ABNORMAL LOW (ref 3.5–5.0)
Anion gap: 14 (ref 5–15)
BUN: 122 mg/dL — ABNORMAL HIGH (ref 6–20)
CO2: 17 mmol/L — ABNORMAL LOW (ref 22–32)
Calcium: 8.5 mg/dL — ABNORMAL LOW (ref 8.9–10.3)
Chloride: 96 mmol/L — ABNORMAL LOW (ref 98–111)
Creatinine, Ser: 8.18 mg/dL — ABNORMAL HIGH (ref 0.61–1.24)
GFR, Estimated: 7 mL/min — ABNORMAL LOW (ref >60)
Glucose, Bld: 129 mg/dL — ABNORMAL HIGH (ref 70–99)
Phosphorus: 4.3 mg/dL (ref 2.5–4.6)
Potassium: 3.8 mmol/L (ref 3.5–5.1)
Sodium: 127 mmol/L — ABNORMAL LOW (ref 135–145)

## 2022-12-13 LAB — CBC
HCT: 23.8 % — ABNORMAL LOW (ref 39.0–52.0)
HCT: 21.7 % — ABNORMAL LOW (ref 39.0–52.0)
Hemoglobin: 8.5 g/dL — ABNORMAL LOW (ref 13.0–17.0)
Hemoglobin: 7.5 g/dL — ABNORMAL LOW (ref 13.0–17.0)
MCH: 31 pg (ref 26.0–34.0)
MCH: 30 pg (ref 26.0–34.0)
MCHC: 35.7 g/dL (ref 30.0–36.0)
MCHC: 34.6 g/dL (ref 30.0–36.0)
MCV: 86.9 fL (ref 80.0–100.0)
MCV: 86.8 fL (ref 80.0–100.0)
Platelets: 224 10*3/uL (ref 150–400)
Platelets: 224 10*3/uL (ref 150–400)
RBC: 2.74 MIL/uL — ABNORMAL LOW (ref 4.22–5.81)
RBC: 2.5 MIL/uL — ABNORMAL LOW (ref 4.22–5.81)
RDW: 12.5 % (ref 11.5–15.5)
RDW: 12.5 % (ref 11.5–15.5)
WBC: 6.8 10*3/uL (ref 4.0–10.5)
WBC: 6.3 10*3/uL (ref 4.0–10.5)
nRBC: 0 % (ref 0.0–0.2)
nRBC: 0 % (ref 0.0–0.2)

## 2022-12-13 LAB — IRON AND TIBC
Iron: 55 ug/dL (ref 45–182)
Saturation Ratios: 19 % (ref 17.9–39.5)
TIBC: 297 ug/dL (ref 250–450)
UIBC: 242 ug/dL

## 2022-12-13 LAB — CREATININE, SERUM
Creatinine, Ser: 8.3 mg/dL — ABNORMAL HIGH (ref 0.61–1.24)
GFR, Estimated: 7 mL/min — ABNORMAL LOW (ref >60)

## 2022-12-13 LAB — ANTITHROMBIN III: AntiThromb III Func: 96 % (ref 75–120)

## 2022-12-13 LAB — CREATININE, URINE, RANDOM: Creatinine, Urine: 80 mg/dL

## 2022-12-13 LAB — HIV ANTIBODY (ROUTINE TESTING W REFLEX): HIV Screen 4th Generation wRfx: NONREACTIVE

## 2022-12-13 LAB — NA AND K (SODIUM & POTASSIUM), RAND UR
Potassium Urine: 16 mmol/L
Sodium, Ur: 11 mmol/L

## 2022-12-13 LAB — CK: Total CK: 89 U/L (ref 49–397)

## 2022-12-13 LAB — FERRITIN: Ferritin: 166 ng/mL (ref 24–336)

## 2022-12-13 LAB — SARS CORONAVIRUS 2 BY RT PCR: SARS Coronavirus 2 by RT PCR: NEGATIVE

## 2022-12-13 LAB — LIPASE, BLOOD: Lipase: 132 U/L — ABNORMAL HIGH (ref 11–51)

## 2022-12-13 MED ORDER — HEPARIN SODIUM (PORCINE) 5000 UNIT/ML IJ SOLN
5000.0000 [IU] | Freq: Three times a day (TID) | INTRAMUSCULAR | Status: DC
  Administered 2022-12-13 – 2022-12-14 (×3): 5000 [IU] via SUBCUTANEOUS
  Filled 2022-12-13 (×3): qty 1

## 2022-12-13 MED ORDER — FAMOTIDINE IN NACL 20-0.9 MG/50ML-% IV SOLN
20.0000 mg | Freq: Once | INTRAVENOUS | Status: AC
Start: 2022-12-13 — End: 2022-12-13
  Administered 2022-12-13: 20 mg via INTRAVENOUS
  Filled 2022-12-13: qty 50

## 2022-12-13 MED ORDER — HYDROMORPHONE HCL 1 MG/ML IJ SOLN
1.0000 mg | Freq: Once | INTRAMUSCULAR | Status: AC
  Administered 2022-12-13: 1 mg via INTRAVENOUS
  Filled 2022-12-13: qty 1

## 2022-12-13 MED ORDER — ONDANSETRON HCL 4 MG/2ML IJ SOLN
4.0000 mg | Freq: Once | INTRAMUSCULAR | Status: AC
  Administered 2022-12-13: 4 mg via INTRAVENOUS
  Filled 2022-12-13: qty 2

## 2022-12-13 MED ORDER — SODIUM CHLORIDE 0.9 % IV BOLUS
1000.0000 mL | Freq: Once | INTRAVENOUS | Status: AC
  Administered 2022-12-13: 1000 mL via INTRAVENOUS

## 2022-12-13 MED ORDER — LIDOCAINE 5 % EX PTCH
1.0000 | MEDICATED_PATCH | CUTANEOUS | Status: DC
  Administered 2022-12-13 – 2022-12-17 (×4): 1 via TRANSDERMAL
  Filled 2022-12-13 (×6): qty 1

## 2022-12-13 MED ORDER — THIAMINE HCL 100 MG/ML IJ SOLN
100.0000 mg | Freq: Every day | INTRAMUSCULAR | Status: DC
  Administered 2022-12-13: 100 mg via INTRAVENOUS
  Filled 2022-12-13: qty 2

## 2022-12-13 MED ORDER — ORAL CARE MOUTH RINSE: 15.0000 mL | OROMUCOSAL | Status: DC | PRN

## 2022-12-13 MED ORDER — LACTATED RINGERS IV BOLUS: 1000.0000 mL | Freq: Once | INTRAVENOUS | Status: DC

## 2022-12-13 MED ORDER — ADULT MULTIVITAMIN W/MINERALS CH
1.0000 | ORAL_TABLET | Freq: Every day | ORAL | Status: DC
  Administered 2022-12-13 – 2022-12-18 (×6): 1 via ORAL
  Filled 2022-12-13 (×6): qty 1

## 2022-12-13 MED ORDER — THIAMINE MONONITRATE 100 MG PO TABS
100.0000 mg | ORAL_TABLET | Freq: Every day | ORAL | Status: DC
  Administered 2022-12-13 – 2022-12-18 (×6): 100 mg via ORAL
  Filled 2022-12-13 (×6): qty 1

## 2022-12-13 MED ORDER — FOLIC ACID 1 MG PO TABS
1.0000 mg | ORAL_TABLET | Freq: Every day | ORAL | Status: DC
  Administered 2022-12-13 – 2022-12-18 (×6): 1 mg via ORAL
  Filled 2022-12-13 (×6): qty 1

## 2022-12-13 MED ORDER — SODIUM CHLORIDE 0.9 % IV BOLUS
500.0000 mL | Freq: Once | INTRAVENOUS | Status: AC
  Administered 2022-12-13: 500 mL via INTRAVENOUS

## 2022-12-13 MED ORDER — ACETAMINOPHEN 325 MG PO TABS
650.0000 mg | ORAL_TABLET | Freq: Four times a day (QID) | ORAL | Status: DC | PRN
  Administered 2022-12-13 – 2022-12-17 (×2): 650 mg via ORAL
  Filled 2022-12-13 (×2): qty 2

## 2022-12-13 MED ORDER — MELATONIN 3 MG PO TABS
3.0000 mg | ORAL_TABLET | Freq: Every day | ORAL | Status: DC
  Administered 2022-12-13 – 2022-12-17 (×5): 3 mg via ORAL
  Filled 2022-12-13 (×5): qty 1

## 2022-12-13 NOTE — ED Notes (Signed)
ED TO INPATIENT HANDOFF REPORT  ED Nurse Name and Phone #: [phone]  S Name/Age/Gender Edgar Olson 62 y.o. male Room/Bed: 039C/039C  Code Status   Code Status: Full Code  Home/SNF/Other Home Patient oriented to: self, place, time, and situation Is this baseline? Yes   Triage Complete: Triage complete  Chief Complaint Acute renal failure (ARF) (HCC) [N17.9]  Triage Note Pt came in via POV d/t n/v & all over abd pain the last week. Was seen by his PCP & advised to come in for eval d/t his condition & Hx. A/Ox4, rates his pain 10/10 while in triage & was laying on the floor on his back for comfort when this RN walked in to speak with him.    Allergies No Known Allergies  Level of Care/Admitting Diagnosis ED Disposition     ED Disposition  Admit   Condition  --   Comment  Hospital Area: [facility] [[id number]]  Level of Care: Telemetry Medical [104]  May admit patient to [facility] or [facility] if equivalent level of care is available:: Yes  Covid Evaluation: Asymptomatic - no recent exposure (last 10 days) testing not required  Diagnosis: Acute renal failure (ARF) [facility]) [[id number]]  Admitting Physician: Nena Polio  Attending Physician: Inez Catalina [phone]  Certification:: I certify this patient will need inpatient services for at least 2 midnights  Estimated Length of Stay: 3          B Medical/Surgery History Past Medical History:  Diagnosis Date   DVT (deep venous thrombosis) (HCC)    Hypertension    Past Surgical History:  Procedure Laterality Date   APPLICATION OF ANGIOVAC Bilateral 11/15/2020   Procedure: APPLICATION OF ANGIOVAC;  Surgeon: Nada Libman, MD;  Location: [facility] OR;  Service: Vascular;  Laterality: Bilateral;   IVC VENOGRAPHY N/A 11/14/2020   Procedure: IVC Venography;  Surgeon: Cephus Shelling, MD;  [facility]: [facility] INVASIVE CV LAB;  Service: Cardiovascular;  Laterality: N/A;   MECHANICAL THROMBECTOMY WITH  AORTOGRAM AND INTERVENTION Bilateral 11/15/2020   Procedure: MECHANICAL THROMBECTOMY;  Surgeon: Nada Libman, MD;  Location: [facility] OR;  Service: Vascular;  Laterality: Bilateral;   PERIPHERAL VASCULAR THROMBECTOMY Bilateral 11/14/2020   Procedure: PERIPHERAL VASCULAR THROMBECTOMY;  Surgeon: Cephus Shelling, MD;  [facility]: [facility] INVAS[facility] CV LAB;  Service: Cardiovascular;  Laterality: Bilateral;  With TPA Infusion    ULTRASOUND GUIDANCE FOR VASCULAR ACCESS Bilateral 11/15/2020   Procedure: ULTRASOUND GUIDANCE FOR VASCULAR ACCESS;  Surgeon: Nada Libman, MD;  Location: [facility] OR;  Service: Vascular;  Laterality: Bilateral;   VENA CAVA FILTER PLACEMENT Right 11/15/2020   Procedure: INSERTION VENA-CAVA FILTER;  Surgeon: Nada Libman, MD;  Location: [facility] OR;  Service: Vascular;  Laterality: Right;   VENOGRAM Right 11/15/2020   Procedure: VENOGRAM;  Surgeon: Nada Libman, MD;  Location: [place] [facility] OR;  Service: Vascular;  Laterality: Right;   VENOPLASTY Right 11/15/2020   Procedure: VENOPLASTY;  Surgeon: Nada Libman, MD;  [facility];  Service: Vascular;  Laterality: Right;     A IV Location/Drains/Wounds Patient Lines/Drains/Airways Status     Active Line/Drains/Airways     Name Placement date Placement time Site Days   Peripheral IV 12/13/22 20 G Anterior;Distal;Right;Upper Arm 12/13/22  1334  Arm  less than 1            Intake/Output Last 24 hours No intake or output data in the 24 hours ending 12/13/22 1645  Labs/Imaging Results for orders  placed or performed during the hospital encounter of 12/13/22 (from the past 48 hour(s))  Lipase, blood     Status: Abnormal   Collection Time: 12/13/22 12:30 PM  Result Value Ref Range   Lipase 132 (H) 11 - 51 U/L    Comment: Performed at [facility], [address] [place], [place] [address]  Comprehensive metabolic panel     Status: Abnormal   Collection Time: 12/13/22 12:30 PM  Result Value Ref Range   Sodium 125 (L) 135 - 145  mmol/L   Potassium 4.4 3.5 - 5.1 mmol/L   Chloride 95 (L) 98 - 111 mmol/L   CO2 16 (L) 22 - 32 mmol/L   Glucose, Bld 155 (H) 70 - 99 mg/dL    Comment: Glucose reference range applies only to samples taken after fasting for at least 8 hours.   BUN 129 (H) 6 - 20 mg/dL   Creatinine, Ser 6.04 (H) 0.61 - 1.24 mg/dL   Calcium 8.7 (L) 8.9 - 10.3 mg/dL   Total Protein 5.8 (L) 6.5 - 8.1 g/dL   Albumin 3.4 (L) 3.5 - 5.0 g/dL   AST 18 15 - 41 U/L   ALT 18 0 - 44 U/L   Alkaline Phosphatase 37 (L) 38 - 126 U/L   Total Bilirubin 0.7 0.3 - 1.2 mg/dL   GFR, Estimated 6 (L) >60 mL/min    Comment: (NOTE) Calculated using the CKD-EPI Creatinine Equation (2021)    Anion gap 14 5 - 15    Comment: Performed at [facility], [address], [place], [place] [address]  CBC     Status: Abnormal   Collection Time: 12/13/22 12:30 PM  Result Value Ref Range   WBC 6.8 4.0 - 10.5 K/uL   RBC 2.74 (L) 4.22 - 5.81 MIL/uL   Hemoglobin 8.5 (L) 13.0 - 17.0 g/dL   HCT 11.9 (L) 14.7 - 82.9 %   MCV 86.9 80.0 - 100.0 fL   MCH 31.0 26.0 - 34.0 pg   MCHC 35.7 30.0 - 36.0 g/dL   RDW 56.2 13.0 - 86.5 %   Platelets 224 150 - 400 K/uL   nRBC 0.0 0.0 - 0.2 %    Comment: Performed at [facility], [address], [place], [place] [address]  CK     Status: None   Collection Time: 12/13/22 12:30 PM  Result Value Ref Range   Total CK 89 49 - 397 U/L    Comment: Performed at [facility], [address] [place], [place] [address]  Urinalysis, Routine w reflex microscopic -Urine, Clean Catch     Status: Abnormal   Collection Time: 12/13/22 12:50 PM  Result Value Ref Range   Color, Urine STRAW (A) YELLOW   APPearance CLEAR CLEAR   Specific Gravity, Urine 1.008 1.005 - 1.030   pH 5.0 5.0 - 8.0   Glucose, UA NEGATIVE NEGATIVE mg/dL   Hgb urine dipstick MODERATE (A) NEGATIVE   Bilirubin Urine NEGATIVE NEGATIVE   Ketones, ur NEGATIVE NEGATIVE mg/dL   Protein, ur NEGATIVE NEGATIVE mg/dL   Nitrite  NEGATIVE NEGATIVE   Leukocytes,Ua NEGATIVE NEGATIVE   RBC / HPF 0-5 0 - 5 RBC/hpf   WBC, UA 0-5 0 - 5 WBC/hpf   Bacteria, UA RARE (A) NONE SEEN   Squamous Epithelial / HPF 0-5 0 - 5 /HPF    Comment: Performed at [facility], [address], [place], [place] [address]  Na  and K (sodium & potassium), rand urine     Status: None   Collection Time: 12/13/22 12:50 PM  Result Value Ref Range   Sodium, Ur 11 mmol/L   Potassium Urine 16 mmol/L    Comment: Performed at [facility], [address], [place], [place] [address]  Creatinine, urine, random     Status: None   Collection Time: 12/13/22 12:50 PM  Result Value Ref Range   Creatinine, Urine 80 mg/dL    Comment: Performed at [facility], [address] [place], [place] [address]  SARS Coronavirus 2 by RT PCR (hospital order, performed in [facility] hospital lab) *cepheid single result test* Anterior Nasal Swab     Status: None   Collection Time: 12/13/22  1:01 PM   Specimen: Anterior Nasal Swab  Result Value Ref Range   SARS Coronavirus 2 by RT PCR NEGATIVE NEGATIVE    Comment: Performed at [facility], [address], [place], [place] [address]   US Renal  Result Date: 12/13/2022 CLINICAL DATA:  Acute kidney injury. EXAM: RENAL / URINARY TRACT ULTRASOUND COMPLETE COMPARISON:  CT abdomen pelvis from same day. FINDINGS: Right Kidney: Renal measurements: 12.6 x 7.0 x 6.5 cm = volume: 249 mL. Echogenicity within normal limits. No mass or hydronephrosis visualized. Left Kidney: Renal measurements: 11.6 x 6.3 x 6.6 cm = volume: 215 mL. Echogenicity within normal limits. No mass or hydronephrosis visualized. Bladder: Appears normal for degree of bladder distention. Other: None. IMPRESSION: 1. Normal renal ultrasound. Electronically Signed   By: Obie Dredge M.D.   On: 12/13/2022 14:48   CT ABDOMEN PELVIS WO CONTRAST  Result Date: 12/13/2022 CLINICAL DATA:  Acute pancreatitis. EXAM: CT ABDOMEN AND PELVIS WITHOUT  CONTRAST TECHNIQUE: Multidetector CT imaging of the abdomen and pelvis was performed following the standard protocol without IV contrast. RADIATION DOSE REDUCTION: This exam was performed according to the departmental dose-optimization program which includes automated exposure control, adjustment of the mA and/or kV according to patient size and/or use of iterative reconstruction technique. COMPARISON:  CT abdomen pelvis dated 08/26/2021. FINDINGS: Lower chest: A cystic lesion in the right lung base posteriorly measures a proximally 4 cm in size. There has been interval resolution of layering material within the cyst that was seen on prior exam. No acute process in the visible chest. Hepatobiliary: No focal liver abnormality is seen. No gallstones, gallbladder wall thickening, or biliary dilatation. Pancreas: Unremarkable. No pancreatic ductal dilatation or surrounding inflammatory changes. Spleen: Normal in size without focal abnormality. Adrenals/Urinary Tract: A benign right adrenal adenoma measures 1.6 cm, unchanged from prior exam. There is a 2 mm nonobstructing calculus in the left kidney. No renal calculi on the right. No hydronephrosis on either side. Bladder is unremarkable. Stomach/Bowel: Stomach is within normal limits. Appendix appears normal. No evidence of bowel wall thickening, distention, or inflammatory changes. Vascular/Lymphatic: Aortic atherosclerosis. An infrarenal inferior vena cava filter is noted. No enlarged abdominal or pelvic lymph nodes. Reproductive: Prostate is unremarkable. Other: No abdominal wall hernia or abnormality. No abdominopelvic ascites. Musculoskeletal: Vertebroplasty changes at T9 and T11 appear unchanged. Chronic anterior wedge deformity of L4 with approximately 25% height loss anteriorly appears unchanged. Degenerative changes are seen in the spine. IMPRESSION: 1. No acute findings in the abdomen or pelvis. No CT evidence of acute pancreatitis. 2. Nonobstructing left  renal calculus. Aortic Atherosclerosis (ICD10-I70.0). Electronically Signed   By: Romona Curls M.D.   On: 12/13/2022 14:14    Pending Labs  Unresulted Labs (From admission, onward)     Start     Ordered   12/14/22 0500  Basic metabolic panel  Tomorrow morning,   R        12/13/22 1639   12/14/22 0500  CBC  Tomorrow morning,   R        12/13/22 1639   12/13/22 1640  CBC  (heparin)  Once,   R       Comments: Baseline for heparin therapy IF NOT ALREADY DRAWN.  Notify MD if PLT < 100 K.    12/13/22 1639   12/13/22 1640  Creatinine, serum  (heparin)  Once,   R       Comments: Baseline for heparin therapy IF NOT ALREADY DRAWN.    12/13/22 1639   12/13/22 1636  HIV Antibody (routine testing w rflx)  (HIV Antibody (Routine testing w reflex) panel)  Once,   R        12/13/22 1639            Vitals/Pain Today's Vitals   12/13/22 1415 12/13/22 1445 12/13/22 1500 12/13/22 1515  BP: 118/77 129/74 137/83   Pulse: 68 61 75   Resp: 18  16   Temp:      TempSrc:      SpO2: 100% 100% 96%   PainSc:    0-No pain    Isolation Precautions No active isolations  Medications Medications  heparin injection 5,000 Units (has no administration in time range)  sodium chloride 0.9 % bolus 1,000 mL (0 mLs Intravenous Stopped 12/13/22 1541)  famotidine (PEPCID) IVPB 20 mg premix (0 mg Intravenous Stopped 12/13/22 1410)  HYDROmorphone (DILAUDID) injection 1 mg (1 mg Intravenous Given 12/13/22 1334)  ondansetron (ZOFRAN) injection 4 mg (4 mg Intravenous Given 12/13/22 1334)    Mobility walks     Focused Assessments Diffuse abdominal tenderness   R Recommendations: See Admitting Provider Note  Report given to:   Additional Notes:

## 2022-12-13 NOTE — ED Notes (Signed)
Internal medicine team at bedside
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
## 2022-12-13 NOTE — ED Provider Notes (Addendum)
EMERGENCY DEPARTMENT AT [facility] Provider Note   CSN: [id number] Arrival date & time: 12/13/22  1118     History  Chief Complaint  Patient presents with  . Abdominal Pain  . Nausea  . Emesis    Edgar Olson is a 62 y.o. male presenting to the emergency department complaint of nausea, vomiting, epigastric discomfort, ongoing for 6 days.  Patient reports loose stools.  He has diffuse abdominal discomfort.  He reports that he does drink about 6 beers a day but denies history of alcohol withdrawal.  He went to urgent care initially where he was referred to the ED for persistent vomiting abdominal pain  HPI     Home Medications Prior to Admission medications   Medication Sig Start Date End Date Taking? Authorizing Provider  amLODipine (NORVASC) 5 MG tablet Take 1 tablet (5 mg total) by mouth daily. Patient not taking: Reported on 08/26/2021 11/18/20   Maretta Bees, MD  apixaban (ELIQUIS) 5 MG TABS tablet Take 1 tablet (5 mg total) by mouth 2 (two) times daily. 03/20/21   Gilda Crease, MD  cyclobenzaprine (FLEXERIL) 10 MG tablet Take 1 tablet (10 mg total) by mouth 2 (two) times daily as needed for muscle spasms. Patient not taking: Reported on 08/26/2021 02/06/21   Alvira Monday, MD  gabapentin (NEURONTIN) 100 MG capsule Take 1 capsule (100 mg total) by mouth 3 (three) times daily. Patient not taking: Reported on 08/26/2021 11/18/20   Maretta Bees, MD  levETIRAcetam (KEPPRA) 500 MG tablet Take 1 tablet (500 mg total) by mouth 2 (two) times daily. Patient not taking: Reported on 08/26/2021 03/20/21   Gilda Crease, MD  oxyCODONE-acetaminophen (PERCOCET/ROXICET) 5-325 MG tablet Take 1 tablet by mouth every 6 (six) hours as needed for severe pain. 08/26/21   Mancel Bale, MD      Allergies    Patient has no known allergies.    Review of Systems   Review of Systems  Physical Exam Updated Vital Signs BP 137/83   Pulse 75    Temp 97.7 F (36.5 C) (Oral)   Resp 18   SpO2 96%  Physical Exam Constitutional:      General: He is not in acute distress. HENT:     Head: Normocephalic and atraumatic.  Eyes:     Conjunctiva/sclera: Conjunctivae normal.     Pupils: Pupils are equal, round, and reactive to light.  Cardiovascular:     Rate and Rhythm: Normal rate and regular rhythm.  Pulmonary:     Effort: Pulmonary effort is normal. No respiratory distress.  Abdominal:     General: There is no distension.     Tenderness: There is generalized abdominal tenderness.  Skin:    General: Skin is warm and dry.  Neurological:     General: No focal deficit present.     Mental Status: He is alert. Mental status is at baseline.  Psychiatric:        Mood and Affect: Mood normal.        Behavior: Behavior normal.     ED Results / Procedures / Treatments   Labs (all labs ordered are listed, but only abnormal results are displayed) Labs Reviewed  LIPASE, BLOOD - Abnormal; Notable for the following components:      Result Value   Lipase 132 (*)    All other components within normal limits  COMPREHENSIVE METABOLIC PANEL - Abnormal; Notable for the following components:   Sodium 125 (*)  Chloride 95 (*)    CO2 16 (*)    Glucose, Bld 155 (*)    BUN 129 (*)    Creatinine, Ser 8.80 (*)    Calcium 8.7 (*)    Total Protein 5.8 (*)    Albumin 3.4 (*)    Alkaline Phosphatase 37 (*)    GFR, Estimated 6 (*)    All other components within normal limits  CBC - Abnormal; Notable for the following components:   RBC 2.74 (*)    Hemoglobin 8.5 (*)    HCT 23.8 (*)    All other components within normal limits  URINALYSIS, ROUTINE W REFLEX MICROSCOPIC - Abnormal; Notable for the following components:   Color, Urine STRAW (*)    Hgb urine dipstick MODERATE (*)    Bacteria, UA RARE (*)    All other components within normal limits  SARS CORONAVIRUS 2 BY RT PCR  NA AND K (SODIUM & POTASSIUM), RAND UR  CK  CREATININE,  URINE, RANDOM    EKG EKG Interpretation Date/Time:  Sunday December 13 2022 11:25:34 EDT Ventricular Rate:  96 PR Interval:  180 QRS Duration:  104 QT Interval:  354 QTC Calculation: 447 R Axis:   7  Text Interpretation: Normal sinus rhythm Minimal voltage criteria for LVH, may be normal variant ( R in aVL ) Abnormal ECG When compared with ECG of 05-Feb-2021 17:53, PREVIOUS ECG IS PRESENT Confirmed by Alvester Chou [phone]) on 12/13/2022 12:52:06 PM  Radiology US Renal  Result Date: 12/13/2022 CLINICAL DATA:  Acute kidney injury. EXAM: RENAL / URINARY TRACT ULTRASOUND COMPLETE COMPARISON:  CT abdomen pelvis from same day. FINDINGS: Right Kidney: Renal measurements: 12.6 x 7.0 x 6.5 cm = volume: 249 mL. Echogenicity within normal limits. No mass or hydronephrosis visualized. Left Kidney: Renal measurements: 11.6 x 6.3 x 6.6 cm = volume: 215 mL. Echogenicity within normal limits. No mass or hydronephrosis visualized. Bladder: Appears normal for degree of bladder distention. Other: None. IMPRESSION: 1. Normal renal ultrasound. Electronically Signed   By: Obie Dredge M.D.   On: 12/13/2022 14:48   CT ABDOMEN PELVIS WO CONTRAST  Result Date: 12/13/2022 CLINICAL DATA:  Acute pancreatitis. EXAM: CT ABDOMEN AND PELVIS WITHOUT CONTRAST TECHNIQUE: Multidetector CT imaging of the abdomen and pelvis was performed following the standard protocol without IV contrast. RADIATION DOSE REDUCTION: This exam was performed according to the departmental dose-optimization program which includes automated exposure control, adjustment of the mA and/or kV according to patient size and/or use of iterative reconstruction technique. COMPARISON:  CT abdomen pelvis dated 08/26/2021. FINDINGS: Lower chest: A cystic lesion in the right lung base posteriorly measures a proximally 4 cm in size. There has been interval resolution of layering material within the cyst that was seen on prior exam. No acute process in the visible  chest. Hepatobiliary: No focal liver abnormality is seen. No gallstones, gallbladder wall thickening, or biliary dilatation. Pancreas: Unremarkable. No pancreatic ductal dilatation or surrounding inflammatory changes. Spleen: Normal in size without focal abnormality. Adrenals/Urinary Tract: A benign right adrenal adenoma measures 1.6 cm, unchanged from prior exam. There is a 2 mm nonobstructing calculus in the left kidney. No renal calculi on the right. No hydronephrosis on either side. Bladder is unremarkable. Stomach/Bowel: Stomach is within normal limits. Appendix appears normal. No evidence of bowel wall thickening, distention, or inflammatory changes. Vascular/Lymphatic: Aortic atherosclerosis. An infrarenal inferior vena cava filter is noted. No enlarged abdominal or pelvic lymph nodes. Reproductive: Prostate is unremarkable. Other: No abdominal wall hernia  or abnormality. No abdominopelvic ascites. Musculoskeletal: Vertebroplasty changes at T9 and T11 appear unchanged. Chronic anterior wedge deformity of L4 with approximately 25% height loss anteriorly appears unchanged. Degenerative changes are seen in the spine. IMPRESSION: 1. No acute findings in the abdomen or pelvis. No CT evidence of acute pancreatitis. 2. Nonobstructing left renal calculus. Aortic Atherosclerosis (ICD10-I70.0). Electronically Signed   By: Romona Curls M.D.   On: 12/13/2022 14:14    Procedures .Critical Care  Performed by: Terald Sleeper, MD Authorized by: Terald Sleeper, MD   Critical care provider statement:    Critical care time (minutes):  40   Critical care time was exclusive of:  Separately billable procedures and treating other patients   Critical care was time spent personally by me on the following activities:  Ordering and performing treatments and interventions, ordering and review of laboratory studies, ordering and review of radiographic studies, pulse oximetry, review of old charts, examination of patient  and evaluation of patient's response to treatment   Care discussed with: admitting provider   Comments:     Hyponatremia, AKI management, tele monitoring     Medications Ordered in ED Medications  sodium chloride 0.9 % bolus 1,000 mL (1,000 mLs Intravenous New Bag/Given 12/13/22 1335)  famotidine (PEPCID) IVPB 20 mg premix (0 mg Intravenous Stopped 12/13/22 1410)  HYDROmorphone (DILAUDID) injection 1 mg (1 mg Intravenous Given 12/13/22 1334)  ondansetron (ZOFRAN) injection 4 mg (4 mg Intravenous Given 12/13/22 1334)    ED Course/ Medical Decision Making/ A&P Clinical Course as of 12/13/22 1535  Sun Dec 13, 2022  1326 BUN(!): 129 [MT]  1327 Creatinine(!): 8.80 [MT]  1409 I spoke to Dr Signe Colt from nephrology who advised that based on his presentation, the acute renal failure is most likely related to dehydration, to be reasonable to treat with IV fluids, obtain renal ultrasound, and nephrology can be consulted by the hospital service if there continues to be a trend in a negative direction or other emergent concerns from a renal failure perspective.  However, kidney function may dramatically improved with IV fluids. [MT]  1535 Admitted to IM [MT]    Clinical Course User Index [MT] Jessica Checketts, Kermit Balo, MD                                 Medical Decision Making Amount and/or Complexity of Data Reviewed Labs: ordered. Decision-making details documented in ED Course. Radiology: ordered.  Risk Prescription drug management. Decision regarding hospitalization.   This patient presents to the ED with concern for nausea, vomiting, epigastric pain. This involves an extensive number of treatment options, and is a complaint that carries with it a high risk of complications and morbidity.  The differential diagnosis includes gastritis was pancreatitis versus biliary disease versus other  Co-morbidities that complicate the patient evaluation: History of frequent recurring alcohol consumption high risk of  alcoholic gastritis  I ordered and personally interpreted labs.  The pertinent results include: Lipase 130.  Sodium 125.  BUN 129.  Creatinine 8.8.  I ordered imaging studies including CT abdomen pelvis, renal ultrasound I independently visualized and interpreted imaging which showed cystic lesion at the base of the right lung, no evidence of acute pancreatitis or biliary disease, chronic kyphoplasty changes of the spine I agree with the radiologist interpretation  The patient was maintained on a cardiac monitor.  I personally viewed and interpreted the cardiac monitored which showed an underlying  rhythm of: Sinus rhythm  Per my interpretation the patient's ECG shows sinus rhythm no acute ischemic findings  I ordered medication including IV fluids for hyponatremia and pancreatitis and prerenal AKI, IV Dilaudid, Zofran, Pepcid for suspected gastritis and abdominal pain I have reviewed the patients home medicines and have made adjustments as needed  Test Considered: Low suspicion for acute PE in this clinical setting.   After the interventions noted above, I reevaluated the patient and found that they have: improved  Social Determinants of Health: patient counseled on the portance of alcohol cessation  Dispostion:  After consideration of the diagnostic results and the patients response to treatment, I feel that the patent would benefit from admission for hyponatremia, felt to be secondary to likely beer potomania, as well as an AKI felt to be secondary to poor water intake and dehydration.  The patient is stable at the time of admission.  He is not requiring emergency dialysis.  Please see ED course for conversation with nephrologist        Final Clinical Impression(s) / ED Diagnoses Final diagnoses:  Acute renal failure, unspecified acute renal failure type (HCC)  Alcohol-induced acute pancreatitis, unspecified complication status  Hyponatremia    Rx / DC Orders ED Discharge  Orders     None         Terald Sleeper, MD 12/13/22 1526    Terald Sleeper, MD 12/13/22 1535

## 2022-12-13 NOTE — ED Notes (Signed)
Spoke with floor, able to send patient upstairs.

## 2022-12-13 NOTE — ED Triage Notes (Signed)
Pt came in via POV d/t n/v & all over abd pain the last week. Was seen by his PCP & advised to come in for eval d/t his condition & Hx. A/Ox4, rates his pain 10/10 while in triage & was laying on the floor on his back for comfort when this RN walked in to speak with him.

## 2022-12-13 NOTE — Progress Notes (Signed)
Explained to patient to use the urinal to measure his urine.

## 2022-12-13 NOTE — Plan of Care (Signed)
°  Problem: Education: °Goal: Knowledge of General Education information will improve °Description: Including pain rating scale, medication(s)/side effects and non-pharmacologic comfort measures °Outcome: Completed/Met °  °

## 2022-12-14 ENCOUNTER — Inpatient Hospital Stay (HOSPITAL_COMMUNITY): Payer: Medicare Other

## 2022-12-14 DIAGNOSIS — M79661 Pain in right lower leg: Secondary | ICD-10-CM

## 2022-12-14 DIAGNOSIS — F1010 Alcohol abuse, uncomplicated: Secondary | ICD-10-CM

## 2022-12-14 DIAGNOSIS — D649 Anemia, unspecified: Secondary | ICD-10-CM

## 2022-12-14 DIAGNOSIS — R197 Diarrhea, unspecified: Secondary | ICD-10-CM

## 2022-12-14 DIAGNOSIS — E871 Hypo-osmolality and hyponatremia: Secondary | ICD-10-CM | POA: Diagnosis not present

## 2022-12-14 DIAGNOSIS — N179 Acute kidney failure, unspecified: Secondary | ICD-10-CM

## 2022-12-14 DIAGNOSIS — R112 Nausea with vomiting, unspecified: Secondary | ICD-10-CM

## 2022-12-14 LAB — TYPE AND SCREEN
ABO/RH(D): B POS
Antibody Screen: NEGATIVE
Unit division: 0

## 2022-12-14 LAB — BPAM RBC
Blood Product Expiration Date: 202408282359
ISSUE DATE / TIME: 202408051421
Unit Type and Rh: 7300

## 2022-12-14 LAB — VITAMIN B12: Vitamin B-12: 624 pg/mL (ref 180–914)

## 2022-12-14 LAB — PREPARE RBC (CROSSMATCH)

## 2022-12-14 LAB — HEMOGLOBIN AND HEMATOCRIT, BLOOD
HCT: 17 % — ABNORMAL LOW (ref 39.0–52.0)
HCT: 19.5 % — ABNORMAL LOW (ref 39.0–52.0)
Hemoglobin: 6.1 g/dL — CL (ref 13.0–17.0)
Hemoglobin: 7 g/dL — ABNORMAL LOW (ref 13.0–17.0)

## 2022-12-14 LAB — FOLATE: Folate: 25.6 ng/mL (ref >5.9)

## 2022-12-14 MED ORDER — SODIUM CHLORIDE 0.9 % IV SOLN
250.0000 mg | Freq: Once | INTRAVENOUS | Status: AC
  Administered 2022-12-14: 250 mg via INTRAVENOUS
  Filled 2022-12-14: qty 20

## 2022-12-14 MED ORDER — SODIUM CHLORIDE 0.9 % IV BOLUS
1000.0000 mL | Freq: Once | INTRAVENOUS | Status: AC
  Administered 2022-12-14: 1000 mL via INTRAVENOUS

## 2022-12-14 MED ORDER — PANTOPRAZOLE SODIUM 40 MG PO TBEC
40.0000 mg | DELAYED_RELEASE_TABLET | Freq: Every day | ORAL | Status: DC
  Administered 2022-12-14: 40 mg via ORAL
  Filled 2022-12-14: qty 1

## 2022-12-14 MED ORDER — PANTOPRAZOLE SODIUM 40 MG IV SOLR
40.0000 mg | Freq: Two times a day (BID) | INTRAVENOUS | Status: DC
  Administered 2022-12-14 – 2022-12-15 (×3): 40 mg via INTRAVENOUS
  Filled 2022-12-14 (×3): qty 10

## 2022-12-14 MED ORDER — SODIUM CHLORIDE 0.9% IV SOLUTION: Freq: Once | INTRAVENOUS | Status: AC
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
## 2022-12-14 NOTE — Progress Notes (Signed)
Bilateral lower extremity venous study completed.   Preliminary results relayed to DO and RN.  Please see CV Procedures for preliminary results.  Christene Lye, RVT  12:38 PM 12/14/22

## 2022-12-14 NOTE — Consult Note (Addendum)
Consultation Note   Referring Provider:  Teaching Service PCP: None Gastroenterologist: Unassigned        Reason for Consultation: Anemia  DOA: 12/13/2022         Hospital Day: 2   ASSESSMENT & PLAN   Patient is a 62 y.o. year old male with a past medical history of DVTs s/p IVC, Etoh abuse    anemia in setting of 6 days of N/V , nearly black diarrhea and mid upper abdominal pain. Rule out PUD.  Hgb 8.5 on admission , ? Baseline but it was 15.6 in April 2023. Hgb down further today to 6.1 but has received a lot of IV fluids. Doesn't appear iron deficient.  --Needs EGD this admission. Needs colonoscopy at some point ( inpatient vrs outpatient). The risks and benefits of EGD with possible biopsies were discussed with the patient who agrees to proceed. I also discussed colonoscopy with him in case we deem it necessary to do inpatient and he is agreeable.  --Blood transfusion in progress --Changing pantoprazole to IV and increasing to BID   Hyponatremia, ? 2/2 to GI losses and possibly beer potomania -Na low but stable at 127.   History of DVTs, s/p IVC. Not anticoagulated, he stopped Eliquis himself months ago.  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   Etoh abuse. Drinking a six pack of beer almost every night  Liver enzymes normal. No liver abnormalities on non-contrast CT AP  AKI in setting of N/V and diarrhea. Nephrology called in ED, will evaluate if not improving. Creatinine 8.1, BUN 114  See PMH for additional medical history   --------------------------------------------------------------------------------------------------    [facility] Attending   I have taken an interval history, reviewed the chart and examined the patient. I agree with the Advanced Practitioner's note, impression and recommendations w/ following additions:  Can see a surprisingly sig new anemia w/ AKI  I think EGD warranted  as outlined by Willette Cluster, -NP above - will see where to go from there.Hope we can schedule tomorrow - not sure.  Iva Boop, MD, [facility] See AMION on call - gastroenterology for best contact person 12/14/2022 5:03 PM  HISTORY OF PRESENT ILLNESS   Seen at Urgent Care 8/2 with N/V and diarrhea as well as mid abdominal pain exacerbated by eating.  Symptoms started 6 days prior. Diarrhea was dark, nearly black. No bismuth use. No iron use. Denies NSAID use. No history of PUD. He describes having an EGD / colonoscopy many years ago in [place] while undergoing workup / treatment for skin cancer. No details available. No FMH of colon cancer.   UC sent him to ED. Hgb found to be 8.5.   Complained of leg pain on admission, he had stopped Eliquis several months ago due to side effects.  [place] of extremities showing chronic DVTs.   ED / Admission workup notable for :  Hgb 8.5 Normal WBC and platelets Ferritin 166 TIBC 297 Iron sat 19 Cr 8.8 >> 8.1 today BUN 126 >> 114 Alb 3.4 Non-contrast CT AP - no acute findings   Labs and Imaging: Recent Labs    12/13/22 1230 12/13/22 1659 12/14/22 0313 12/14/22 1134  WBC 6.8 6.3  5.0  --   HGB 8.5* 7.5* 7.0* 6.1*  HCT 23.8* 21.7* 19.7* 17.0*  PLT 224 224 233  --    Recent Labs    12/13/22 1230 12/13/22 1659 12/13/22 1835 12/14/22 0313  NA 125*  --  127* 127*  K 4.4  --  3.8 3.7  CL 95*  --  96* 97*  CO2 16*  --  17* 17*  GLUCOSE 155*  --  129* 100*  BUN 129*  --  122* 114*  CREATININE 8.80* 8.30* 8.18* 8.10*  CALCIUM 8.7*  --  8.5* 8.3*   Recent Labs    12/14/22 0313  PROT 5.4*  ALBUMIN 3.1*  AST 15  ALT 16  ALKPHOS 31*  BILITOT 0.6   No results for input(s): "HEPBSAG", "HCVAB", "HEPAIGM", "HEPBIGM" in the last 72 hours. No results for input(s): "LABPROT", "INR" in the last 72 hours.    Past Medical History:  Diagnosis Date   DVT (deep venous thrombosis) (HCC)    Hypertension     Past  Surgical History:  Procedure Laterality Date   APPLICATION OF ANGIOVAC Bilateral 11/15/2020   Procedure: APPLICATION OF ANGIOVAC;  Surgeon: Nada Libman, MD;  Location: [facility] OR;  Service: Vascular;  Laterality: Bilateral;   IVC VENOGRAPHY N/A 11/14/2020   Procedure: IVC Venography;  Surgeon: Cephus Shelling, MD;  Location: [facility] INVASIVE CV LAB;  Service: Cardiovascular;  Laterality: N/A;   MECHANICAL THROMBECTOMY WITH AORTOGRAM AND INTERVENTION Bilateral 11/15/2020   Procedure: MECHANICAL THROMBECTOMY;  Surgeon: Nada Libman, MD;  Location: [facility] OR;  Service: Vascular;  Laterality: Bilateral;   PERIPHERAL VASCULAR THROMBECTOMY Bilateral 11/14/2020   Procedure: PERIPHERAL VASCULAR THROMBECTOMY;  Surgeon: Cephus Shelling, MD;  Location: [facility] INVASIVE CV LAB;  Service: Cardiovascular;  Laterality: Bilateral;  With TPA Infusion    ULTRASOUND GUIDANCE FOR VASCULAR ACCESS Bilateral 11/15/2020   Procedure: ULTRASOUND GUIDANCE FOR VASCULAR ACCESS;  Surgeon: Nada Libman, MD;  Location: [facility] OR;  Service: Vascular;  Laterality: Bilateral;   VENA CAVA FILTER PLACEMENT Right 11/15/2020   Procedure: INSERTION VENA-CAVA FILTER;  Surgeon: Nada Libman, MD;  Location: [facility] OR;  Service: Vascular;  Laterality: Right;   VENOGRAM Right 11/15/2020   Procedure: VENOGRAM;  Surgeon: Nada Libman, MD;  Location: [facility] OR;  Service: Vascular;  Laterality: Right;   VENOPLASTY Right 11/15/2020   Procedure: VENOPLASTY;  Surgeon: Nada Libman, MD;  Location: [facility] OR;  Service: Vascular;  Laterality: Right;    History reviewed. No pertinent family history.  Prior to Admission medications   Not on File    Current Facility-Administered Medications  Medication Dose Route Frequency Provider Last Rate Last Admin   acetaminophen (TYLENOL) tablet 650 mg  650 mg Oral Q6H PRN Katheran James, DO   650 mg at 12/13/22 2247   folic acid (FOLVITE) tablet 1 mg  1 mg Oral Daily Monna Fam, MD   1 mg at 12/14/22 0953    lidocaine (LIDODERM) 5 % 1 patch  1 patch Transdermal Q24H Katheran James, DO   1 patch at 12/13/22 2247   melatonin tablet 3 mg  3 mg Oral QHS Monna Fam, MD   3 mg at 12/13/22 2101   multivitamin with minerals tablet 1 tablet  1 tablet Oral Daily Monna Fam, MD   1 tablet at 12/14/22 1610   Oral care mouth rinse  15 mL Mouth Rinse PRN Inez Catalina, MD       pantoprazole (PROTONIX) EC tablet 40  mg  40 mg Oral Daily Monna Fam, MD   40 mg at 12/14/22 1241   thiamine (VITAMIN B1) tablet 100 mg  100 mg Oral Daily Monna Fam, MD   100 mg at 12/14/22 1610   Or   thiamine (VITAMIN B1) injection 100 mg  100 mg Intravenous Daily Monna Fam, MD   100 mg at 12/13/22 2058    Allergies as of 12/13/2022   (No Known Allergies)    Social History   Socioeconomic History   Marital status: Single    Spouse name: Not on file   Number of children: Not on file   Years of education: Not on file   Highest education level: Not on file  Occupational History   Not on file  Tobacco Use   Smoking status: Former    Current packs/day: 0.00    Types: Cigarettes    Quit date: 2018    Years since quitting: 6.5   Smokeless tobacco: Never  Vaping Use   Vaping status: Never Used  Substance and Sexual Activity   Alcohol use: Yes    Alcohol/week: 42.0 standard drinks of alcohol    Types: 42 Cans of beer per week    Comment: 6 beers a night   Drug use: Yes    Frequency: 1.0 times per week    Types: Marijuana   Sexual activity: Not on file  Other Topics Concern   Not on file  Social History Narrative   Not on file   Social Determinants of Health   Financial Resource Strain: Not on file  Food Insecurity: Food Insecurity Present (12/13/2022)   Hunger Vital Sign    Worried About Running Out of Food in the Last Year: Sometimes true    Ran Out of Food in the Last Year: Sometimes true  Transportation Needs: No Transportation Needs (12/13/2022)   PRAPARE - Scientist, research (physical sciences) (Medical): No    Lack of Transportation (Non-Medical): No  Physical Activity: Not on file  Stress: Not on file  Social Connections: Not on file  Intimate Partner Violence: Not At Risk (12/13/2022)   Humiliation, Afraid, Rape, and Kick questionnaire    Fear of Current or Ex-Partner: No    Emotionally Abused: No    Physically Abused: No    Sexually Abused: No     Code Status   Code Status: Full Code  Review of Systems: All systems reviewed and negative except where noted in HPI.  Physical Exam: Vital signs in last 24 hours: Temp:  [97.5 F (36.4 C)-98.5 F (36.9 C)] 98.2 F (36.8 C) (08/05 1425) Pulse Rate:  [53-88] 73 (08/05 1425) Resp:  [16-20] 18 (08/05 1425) BP: (114-166)/(60-88) 114/60 (08/05 1425) SpO2:  [96 %-100 %] 100 % (08/05 1425) Weight:  [85 kg-92.6 kg] 85 kg (08/05 0402) Last BM Date : 12/14/22  General:  Pleasant male in NAD Psych:  Cooperative. Normal mood and affect Eyes: Pupils equal Ears:  Normal auditory acuity Nose: No deformity, discharge or lesions Neck:  Supple, no masses felt Lungs:  Clear to auscultation.  Heart:  Regular rate, regular rhythm.  Abdomen:  Soft, nondistended, nontender, active bowel sounds, no masses felt Rectal :  Deferred Msk: Symmetrical without gross deformities.  Neurologic:  Alert, oriented, grossly normal neurologically Extremities : No edema Skin:  Intact without significant lesions.    Intake/Output from previous day: 08/04 0701 - 08/05 0700 In: 2280 [P.O.:2280] Out: 225 [Urine:225] Intake/Output this shift:  Total I/O  In: 805 [P.O.:805] Out: 325 [Urine:325]  Principal Problem:   Acute renal failure (ARF) (HCC) Active Problems:   Normocytic anemia   Hyponatremia    Willette Cluster, NP-C @  12/14/2022, 2:36 PM

## 2022-12-14 NOTE — TOC CM/SW Note (Signed)
Transition of Care [facility]) - Inpatient Brief Assessment   Patient Details  Name: Timber Pesicka MRN: [id number] Date of Birth: 04-Nov-1960  Transition of Care [facility]) CM/SW Contact:    Tom-Johnson, Hershal Coria, RN Phone Number: 12/14/2022, 4:41 PM   Clinical Narrative:  Patient presented to the ED with N/V and Abdominal pain. Found to be in Acute Renal failure. Creatinine today at 8.10. Hgb on admission was 8.5, today at 6.1. GI following.   From home, staying with his oldest son. Has three children. Not employed, on disability. Does not have DME's at home. Does not have a PCP. New patient establishment and Hosp f/u scheduled at [facility], info on AVS.  Not Medically ready for discharge.     CM will continue to follow as patient progresses with care towards discharge.      Transition of Care Asessment: Insurance and Status: Insurance coverage has been reviewed Patient has primary care physician: No (scheduled with) Home environment has been reviewed: Yes Prior level of function:: Independent Prior/Current Home Services: No current home services Social Determinants of Health Reivew: SDOH reviewed needs interventions Readmission risk has been reviewed: Yes Transition of care needs: transition of care needs identified, TOC will continue to follow

## 2022-12-14 NOTE — Progress Notes (Signed)
CRITICAL VALUE STICKER  CRITICAL VALUE: hemoglobin 6.1  RECEIVER (on-site recipient of call): Ricky Ala, RN  DATE & TIME NOTIFIED: 12/14/22 1210  MESSENGER (representative from lab): Zelda  MD NOTIFIED: Carlynn Purl, DO & Olegario Messier, MD  TIME OF NOTIFICATION: 12/13/22 1211  RESPONSE:  See orders

## 2022-12-15 DIAGNOSIS — F1010 Alcohol abuse, uncomplicated: Secondary | ICD-10-CM | POA: Diagnosis not present

## 2022-12-15 DIAGNOSIS — N179 Acute kidney failure, unspecified: Secondary | ICD-10-CM | POA: Diagnosis not present

## 2022-12-15 DIAGNOSIS — D649 Anemia, unspecified: Secondary | ICD-10-CM | POA: Diagnosis not present

## 2022-12-15 DIAGNOSIS — E871 Hypo-osmolality and hyponatremia: Secondary | ICD-10-CM | POA: Diagnosis not present

## 2022-12-15 LAB — HEMOGLOBIN AND HEMATOCRIT, BLOOD
HCT: 21 % — ABNORMAL LOW (ref 39.0–52.0)
Hemoglobin: 7.3 g/dL — ABNORMAL LOW (ref 13.0–17.0)

## 2022-12-15 MED ORDER — SODIUM CHLORIDE 0.9 % IV BOLUS
1000.0000 mL | Freq: Once | INTRAVENOUS | Status: AC
  Administered 2022-12-15: 1000 mL via INTRAVENOUS

## 2022-12-15 NOTE — Progress Notes (Addendum)
Daily Progress Note  DOA: 12/13/2022 Hospital Day: 3 Chief Complaint: anemia  ASSESSMENT & PLAN   Brief Narrative:  Edgar Olson is a 62 y.o. year old male with a history of   DVTs s/p IVC, Etoh abuse   Thunderbolt anemia in setting of 6 days of N/V , nearly black diarrhea and mid upper abdominal pain. Rule out PUD.  Hgb 8.5 on admission , ? Baseline but it was 15.6 in April 2023. Doesn't appear iron deficient.  --Needs EGD this admission. Needs colonoscopy at some point ( inpatient vrs outpatient). The risks and benefits of EGD with possible biopsies were discussed with the patient who agrees to proceed. I also discussed colonoscopy with him in case we deem it necessary to do inpatient and he is agreeable. Timing of procedure(s) will depend on availability of Endoscopy / Anesthesia.   --s/p 1 u blood with appropriate rise in hgb from 6.1 to 7.1.  --Changing pantoprazole to IV and increasing to BID   Hyponatremia, ? 2/2 to GI losses and possibly beer potomania -Na improved - 132    History of DVTs, s/p IVC. Not anticoagulated, he stopped Eliquis himself months ago.  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    Etoh abuse. Drinking a six pack of beer almost every night  Liver enzymes normal. No liver abnormalities on non-contrast CT AP   AKI in setting of N/V and diarrhea. Nephrology called in ED, will evaluate if not improving. Creatinine improving  - 6.79  -------------------------------------------------------------------------------------------------------    [facility] Attending   I have taken an interval history, reviewed the chart and examined the patient. I agree with the Advanced Practitioner's note, impression and recommendations - w/ following additions:  Patient frustrated over diet status relevant to anticipated 1 PM case tomorrow   IM team to restart IVF  Further plans pending EGD  Iva Boop, MD, [facility] See  Loretha Stapler on call - gastroenterology for best contact person 12/15/2022 1:49 PM  Subjective   No complaints. Tolerated solids for breakfast. No further N/V or diarrhea  Objective    Recent Labs    12/13/22 1659 12/14/22 0313 12/14/22 1134 12/14/22 1941 12/15/22 0423  WBC 6.3 5.0  --   --  5.8  HGB 7.5* 7.0* 6.1* 7.0* 7.1*  HCT 21.7* 19.7* 17.0* 19.5* 20.4*  PLT 224 233  --   --  226   BMET Recent Labs    12/13/22 1835 12/14/22 0313 12/15/22 0423  NA 127* 127* 132*  K 3.8 3.7 3.4*  CL 96* 97* 102  CO2 17* 17* 19*  GLUCOSE 129* 100* 94  BUN 122* 114* 92*  CREATININE 8.18* 8.10* 6.79*  CALCIUM 8.5* 8.3* 8.4*   LFT Recent Labs    12/14/22 0313  PROT 5.4*  ALBUMIN 3.1*  AST 15  ALT 16  ALKPHOS 31*  BILITOT 0.6   PT/INR No results for input(s): "LABPROT", "INR" in the last 72 hours.   Imaging:  VAS [place] LOWER EXTREMITY VENOUS (DVT)  Lower Venous DVT Study  Patient Name:  Edgar Olson  Date of Exam:   12/14/2022 Medical Rec #: [id number]        Accession #:    [id number] Date of Birth: 1960/06/30        Patient Gender: M Patient Age:   48 years Exam Location:  [facility] Procedure:      VAS [place] LOWER EXTREMITY VENOUS (DVT) Referring Phys:  EMILY MULLEN  --------------------------------------------------------------------------------   Indications: Pain, and Hx of DVT bilaterally.   Comparison Study: Previous LLE 12/02/20 chronic throughout leg.                   Previous RLE 11/12/20 acute whole leg.  Performing Technologist: McKayla Maag RVT, VT    Examination Guidelines: A complete evaluation includes B-mode imaging, spectral Doppler, color Doppler, and power Doppler as needed of all accessible portions of each vessel. Bilateral testing is considered an integral part of a complete examination. Limited examinations for reoccurring indications may be performed as noted. The reflux portion of the exam is performed with the patient in reverse  Trendelenburg.     +---------+---------------+---------+-----------+----------+--------------+ RIGHT    CompressibilityPhasicitySpontaneityPropertiesThrombus Aging +---------+---------------+---------+-----------+----------+--------------+ CFV      Full           Yes      Yes                                 +---------+---------------+---------+-----------+----------+--------------+ SFJ      Full                                                        +---------+---------------+---------+-----------+----------+--------------+ FV Prox  Full                                                        +---------+---------------+---------+-----------+----------+--------------+ FV Mid   Partial        Yes      Yes                  Chronic        +---------+---------------+---------+-----------+----------+--------------+ FV DistalPartial        Yes      Yes                  Chronic        +---------+---------------+---------+-----------+----------+--------------+ PFV      Full                                                        +---------+---------------+---------+-----------+----------+--------------+ POP      Partial        Yes      Yes                  Chronic        +---------+---------------+---------+-----------+----------+--------------+ PTV      Full                                                        +---------+---------------+---------+-----------+----------+--------------+ PERO     Full                                                        +---------+---------------+---------+-----------+----------+--------------+        +---------+---------------+---------+-----------+----------+--------------+  LEFT     CompressibilityPhasicitySpontaneityPropertiesThrombus Aging +---------+---------------+---------+-----------+----------+--------------+ CFV      Full           Yes      Yes                                  +---------+---------------+---------+-----------+----------+--------------+ SFJ      Full                                                        +---------+---------------+---------+-----------+----------+--------------+ FV Prox  Partial        Yes      Yes                  Chronic        +---------+---------------+---------+-----------+----------+--------------+ FV Mid   Partial        Yes      Yes                  Chronic        +---------+---------------+---------+-----------+----------+--------------+ FV DistalPartial        Yes      Yes                  Chronic        +---------+---------------+---------+-----------+----------+--------------+ PFV      Full                                                        +---------+---------------+---------+-----------+----------+--------------+ POP      Partial        Yes      Yes                  Chronic        +---------+---------------+---------+-----------+----------+--------------+ PTV      Full                                                        +---------+---------------+---------+-----------+----------+--------------+ PERO                                                  Not visualized +---------+---------------+---------+-----------+----------+--------------+ GSV      Partial        Yes      Yes                  Chronic        +---------+---------------+---------+-----------+----------+--------------+             Summary: RIGHT: - Findings consistent with chronic deep vein thrombosis involving the right mid and distal femoral veins, and right popliteal vein. - No cystic structure found in the popliteal fossa.   LEFT: - Findings consistent with chronic deep vein thrombosis involving the left proximal, mid and distal femoral veins, and left popliteal vein. 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 - No cystic structure found in the popliteal fossa.    *See table(s) above for measurements and observations.  Electronically signed by Gerarda Fraction on 12/14/2022 at 7:43:45 PM.      Final       Scheduled inpatient medications:   folic acid  1 mg Oral Daily   lidocaine  1 patch Transdermal Q24H   melatonin  3 mg Oral QHS   multivitamin with minerals  1 tablet Oral Daily   pantoprazole (PROTONIX) IV  40 mg Intravenous Q12H   thiamine  100 mg Oral Daily   Or   thiamine  100 mg Intravenous Daily   Continuous inpatient infusions:  PRN inpatient medications: acetaminophen, mouth rinse  Vital signs in last 24 hours: Temp:  [98.1 F (36.7 C)-98.6 F (37 C)] 98.4 F (36.9 C) (08/06 0918) Pulse Rate:  [63-74] 74 (08/06 0918) Resp:  [14-18] 18 (08/06 0918) BP: (112-156)/(60-88) 156/86 (08/06 0918) SpO2:  [99 %-100 %] 100 % (08/06 0918) Last BM Date : 12/14/22  Intake/Output Summary (Last 24 hours) at 12/15/2022 1037 Last data filed at 12/15/2022 0440 Gross per 24 hour  Intake 820.17 ml  Output 1080 ml  Net -259.83 ml    Intake/Output from previous day: 08/05 0701 - 08/06 0700 In: 1385.2 [P.O.:1045; I.V.:10.2; Blood:330] Out: 1405 [Urine:1405] Intake/Output this shift: No intake/output data recorded.   Physical Exam:  General: Alert male in NAD Heart:  Regular rate and rhythm.  Pulmonary: Normal respiratory effort Abdomen: Soft, nondistended, nontender. Normal bowel sounds. Neurologic: Alert and oriented Psych: Pleasant. Cooperative. Insight appears normal.    Principal Problem:   Acute renal failure (ARF) (HCC) Active Problems:   Normocytic anemia   Hyponatremia     LOS: 2 days   Willette Cluster ,NP 12/15/2022, 10:37 AM

## 2022-12-15 NOTE — Plan of Care (Signed)

## 2022-12-15 NOTE — Progress Notes (Signed)
Spoke to Edgar Olson at bedside. He is concerned about being NPO in preparation for his EGD tomorrow at 1. In particular, he is concerned about his mouth getting dry, which he says impacts breathing. Nursing has kindly offered to swab his mouth for that. He felt better after that. He did show me recent stool, which remains soft and jet black. VSS. No other complaints. EGD tomorrow.

## 2022-12-15 NOTE — H&P (View-Only) (Signed)
Daily Progress Note  DOA: 12/13/2022 Hospital Day: 3 Chief Complaint: anemia  ASSESSMENT & PLAN   Brief Narrative:  Edgar Olson is a 62 y.o. year old male with a history of   DVTs s/p IVC, Etoh abuse   Thunderbolt anemia in setting of 6 days of N/V , nearly black diarrhea and mid upper abdominal pain. Rule out PUD.  Hgb 8.5 on admission , ? Baseline but it was 15.6 in April 2023. Doesn't appear iron deficient.  --Needs EGD this admission. Needs colonoscopy at some point ( inpatient vrs outpatient). The risks and benefits of EGD with possible biopsies were discussed with the patient who agrees to proceed. I also discussed colonoscopy with him in case we deem it necessary to do inpatient and he is agreeable. Timing of procedure(s) will depend on availability of Endoscopy / Anesthesia.   --s/p 1 u blood with appropriate rise in hgb from 6.1 to 7.1.  --Changing pantoprazole to IV and increasing to BID   Hyponatremia, ? 2/2 to GI losses and possibly beer potomania -Na improved - 132    History of DVTs, s/p IVC. Not anticoagulated, he stopped Eliquis himself months ago.  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    Etoh abuse. Drinking a six pack of beer almost every night  Liver enzymes normal. No liver abnormalities on non-contrast CT AP   AKI in setting of N/V and diarrhea. Nephrology called in ED, will evaluate if not improving. Creatinine improving  - 6.79  -------------------------------------------------------------------------------------------------------    [facility] Attending   I have taken an interval history, reviewed the chart and examined the patient. I agree with the Advanced Practitioner's note, impression and recommendations - w/ following additions:  Patient frustrated over diet status relevant to anticipated 1 PM case tomorrow   IM team to restart IVF  Further plans pending EGD  Iva Boop, MD, [facility] See  Loretha Stapler on call - gastroenterology for best contact person 12/15/2022 1:49 PM  Subjective   No complaints. Tolerated solids for breakfast. No further N/V or diarrhea  Objective    Recent Labs    12/13/22 1659 12/14/22 0313 12/14/22 1134 12/14/22 1941 12/15/22 0423  WBC 6.3 5.0  --   --  5.8  HGB 7.5* 7.0* 6.1* 7.0* 7.1*  HCT 21.7* 19.7* 17.0* 19.5* 20.4*  PLT 224 233  --   --  226   BMET Recent Labs    12/13/22 1835 12/14/22 0313 12/15/22 0423  NA 127* 127* 132*  K 3.8 3.7 3.4*  CL 96* 97* 102  CO2 17* 17* 19*  GLUCOSE 129* 100* 94  BUN 122* 114* 92*  CREATININE 8.18* 8.10* 6.79*  CALCIUM 8.5* 8.3* 8.4*   LFT Recent Labs    12/14/22 0313  PROT 5.4*  ALBUMIN 3.1*  AST 15  ALT 16  ALKPHOS 31*  BILITOT 0.6   PT/INR No results for input(s): "LABPROT", "INR" in the last 72 hours.   Imaging:  VAS [place] LOWER EXTREMITY VENOUS (DVT)  Lower Venous DVT Study  Patient Name:  Edgar Olson  Date of Exam:   12/14/2022 Medical Rec #: [id number]        Accession #:    [id number] Date of Birth: 1960/06/30        Patient Gender: M Patient Age:   48 years Exam Location:  [facility] Procedure:      VAS [place] LOWER EXTREMITY VENOUS (DVT) Referring Phys:  EMILY MULLEN  --------------------------------------------------------------------------------   Indications: Pain, and Hx of DVT bilaterally.   Comparison Study: Previous LLE 12/02/20 chronic throughout leg.                   Previous RLE 11/12/20 acute whole leg.  Performing Technologist: McKayla Maag RVT, [place]    Examination Guidelines: A complete evaluation includes B-mode imaging, spectral Doppler, color Doppler, and power Doppler as needed of all accessible portions of each vessel. Bilateral testing is considered an integral part of a complete examination. Limited examinations for reoccurring indications may be performed as noted. The reflux portion of the exam is performed with the patient in reverse  Trendelenburg.     +---------+---------------+---------+-----------+----------+--------------+ RIGHT    CompressibilityPhasicitySpontaneityPropertiesThrombus Aging +---------+---------------+---------+-----------+----------+--------------+ CFV      Full           Yes      Yes                                 +---------+---------------+---------+-----------+----------+--------------+ SFJ      Full                                                        +---------+---------------+---------+-----------+----------+--------------+ FV Prox  Full                                                        +---------+---------------+---------+-----------+----------+--------------+ FV Mid   Partial        Yes      Yes                  Chronic        +---------+---------------+---------+-----------+----------+--------------+ FV DistalPartial        Yes      Yes                  Chronic        +---------+---------------+---------+-----------+----------+--------------+ PFV      Full                                                        +---------+---------------+---------+-----------+----------+--------------+ POP      Partial        Yes      Yes                  Chronic        +---------+---------------+---------+-----------+----------+--------------+ PTV      Full                                                        +---------+---------------+---------+-----------+----------+--------------+ PERO     Full                                                        +---------+---------------+---------+-----------+----------+--------------+        +---------+---------------+---------+-----------+----------+--------------+  LEFT     CompressibilityPhasicitySpontaneityPropertiesThrombus Aging +---------+---------------+---------+-----------+----------+--------------+ CFV      Full           Yes      Yes                                  +---------+---------------+---------+-----------+----------+--------------+ SFJ      Full                                                        +---------+---------------+---------+-----------+----------+--------------+ FV Prox  Partial        Yes      Yes                  Chronic        +---------+---------------+---------+-----------+----------+--------------+ FV Mid   Partial        Yes      Yes                  Chronic        +---------+---------------+---------+-----------+----------+--------------+ FV DistalPartial        Yes      Yes                  Chronic        +---------+---------------+---------+-----------+----------+--------------+ PFV      Full                                                        +---------+---------------+---------+-----------+----------+--------------+ POP      Partial        Yes      Yes                  Chronic        +---------+---------------+---------+-----------+----------+--------------+ PTV      Full                                                        +---------+---------------+---------+-----------+----------+--------------+ PERO                                                  Not visualized +---------+---------------+---------+-----------+----------+--------------+ GSV      Partial        Yes      Yes                  Chronic        +---------+---------------+---------+-----------+----------+--------------+             Summary: RIGHT: - Findings consistent with chronic deep vein thrombosis involving the right mid and distal femoral veins, and right popliteal vein. - No cystic structure found in the popliteal fossa.   LEFT: - Findings consistent with chronic deep vein thrombosis involving the left proximal, mid and distal femoral veins, and left popliteal vein. 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 - No cystic structure found in the popliteal fossa.    *See table(s) above for measurements and observations.  Electronically signed by Gerarda Fraction on 12/14/2022 at 7:43:45 PM.      Final       Scheduled inpatient medications:   folic acid  1 mg Oral Daily   lidocaine  1 patch Transdermal Q24H   melatonin  3 mg Oral QHS   multivitamin with minerals  1 tablet Oral Daily   pantoprazole (PROTONIX) IV  40 mg Intravenous Q12H   thiamine  100 mg Oral Daily   Or   thiamine  100 mg Intravenous Daily   Continuous inpatient infusions:  PRN inpatient medications: acetaminophen, mouth rinse  Vital signs in last 24 hours: Temp:  [98.1 F (36.7 C)-98.6 F (37 C)] 98.4 F (36.9 C) (08/06 0918) Pulse Rate:  [63-74] 74 (08/06 0918) Resp:  [14-18] 18 (08/06 0918) BP: (112-156)/(60-88) 156/86 (08/06 0918) SpO2:  [99 %-100 %] 100 % (08/06 0918) Last BM Date : 12/14/22  Intake/Output Summary (Last 24 hours) at 12/15/2022 1037 Last data filed at 12/15/2022 0440 Gross per 24 hour  Intake 820.17 ml  Output 1080 ml  Net -259.83 ml    Intake/Output from previous day: 08/05 0701 - 08/06 0700 In: 1385.2 [P.O.:1045; I.V.:10.2; Blood:330] Out: 1405 [Urine:1405] Intake/Output this shift: No intake/output data recorded.   Physical Exam:  General: Alert male in NAD Heart:  Regular rate and rhythm.  Pulmonary: Normal respiratory effort Abdomen: Soft, nondistended, nontender. Normal bowel sounds. Neurologic: Alert and oriented Psych: Pleasant. Cooperative. Insight appears normal.    Principal Problem:   Acute renal failure (ARF) (HCC) Active Problems:   Normocytic anemia   Hyponatremia     LOS: 2 days   Willette Cluster ,NP 12/15/2022, 10:37 AM
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
## 2022-12-16 ENCOUNTER — Inpatient Hospital Stay (HOSPITAL_COMMUNITY): Payer: Medicare Other | Admitting: Anesthesiology

## 2022-12-16 ENCOUNTER — Encounter (HOSPITAL_COMMUNITY): Payer: Self-pay | Admitting: Internal Medicine

## 2022-12-16 ENCOUNTER — Encounter (HOSPITAL_COMMUNITY)
Admission: EM | Disposition: A | Discharge status: Home / Self Care | Payer: Self-pay | Source: Home / Self Care | Attending: Internal Medicine

## 2022-12-16 DIAGNOSIS — K269 Duodenal ulcer, unspecified as acute or chronic, without hemorrhage or perforation: Secondary | ICD-10-CM | POA: Diagnosis not present

## 2022-12-16 DIAGNOSIS — Z87891 Personal history of nicotine dependence: Secondary | ICD-10-CM

## 2022-12-16 DIAGNOSIS — K259 Gastric ulcer, unspecified as acute or chronic, without hemorrhage or perforation: Secondary | ICD-10-CM | POA: Diagnosis not present

## 2022-12-16 DIAGNOSIS — N179 Acute kidney failure, unspecified: Secondary | ICD-10-CM | POA: Diagnosis not present

## 2022-12-16 DIAGNOSIS — I10 Essential (primary) hypertension: Secondary | ICD-10-CM | POA: Diagnosis not present

## 2022-12-16 HISTORY — PX: BIOPSY: SHX5522

## 2022-12-16 HISTORY — PX: ESOPHAGOGASTRODUODENOSCOPY (EGD) WITH PROPOFOL: SHX5813

## 2022-12-16 LAB — PREPARE RBC (CROSSMATCH)

## 2022-12-16 LAB — HEMOGLOBIN AND HEMATOCRIT, BLOOD
HCT: 24.5 % — ABNORMAL LOW (ref 39.0–52.0)
HCT: 23.9 % — ABNORMAL LOW (ref 39.0–52.0)
Hemoglobin: 8.5 g/dL — ABNORMAL LOW (ref 13.0–17.0)
Hemoglobin: 8.1 g/dL — ABNORMAL LOW (ref 13.0–17.0)

## 2022-12-16 SURGERY — ESOPHAGOGASTRODUODENOSCOPY (EGD) WITH PROPOFOL
Anesthesia: Monitor Anesthesia Care

## 2022-12-16 MED ORDER — SODIUM CHLORIDE 0.9% IV SOLUTION: Freq: Once | INTRAVENOUS | Status: AC

## 2022-12-16 MED ORDER — SODIUM CHLORIDE 0.9% IV SOLUTION: Freq: Once | INTRAVENOUS | Status: DC

## 2022-12-16 MED ORDER — PANTOPRAZOLE SODIUM 40 MG PO TBEC
40.0000 mg | DELAYED_RELEASE_TABLET | Freq: Two times a day (BID) | ORAL | Status: DC
  Administered 2022-12-16 – 2022-12-18 (×4): 40 mg via ORAL
  Filled 2022-12-16 (×4): qty 1

## 2022-12-16 MED ORDER — SODIUM CHLORIDE 0.9 % IV SOLN: INTRAVENOUS | Status: DC | PRN

## 2022-12-16 MED ORDER — SODIUM CHLORIDE 0.9 % IV BOLUS
1000.0000 mL | Freq: Once | INTRAVENOUS | Status: AC
  Administered 2022-12-16: 1000 mL via INTRAVENOUS

## 2022-12-16 MED ORDER — PROPOFOL 500 MG/50ML IV EMUL
INTRAVENOUS | Status: DC | PRN
  Administered 2022-12-16: 30 mg via INTRAVENOUS
  Administered 2022-12-16: 50 mg via INTRAVENOUS
  Administered 2022-12-16: 200 ug/kg/min via INTRAVENOUS
  Administered 2022-12-16: 30 mg via INTRAVENOUS
  Administered 2022-12-16: 50 mg via INTRAVENOUS
  Administered 2022-12-16: 30 mg via INTRAVENOUS

## 2022-12-16 MED ORDER — SODIUM CHLORIDE 0.9 % IV SOLN: INTRAVENOUS | Status: DC

## 2022-12-16 SURGICAL SUPPLY — 15 items

## 2022-12-16 NOTE — Plan of Care (Signed)
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
## 2022-12-16 NOTE — Op Note (Signed)
[facility] Patient Name: Edgar Olson Procedure Date : 12/16/2022 MRN: [id number] Attending MD: Iva Boop , MD, [id number] Date of Birth: 16-May-1960 CSN: [id number] Age: 62 Admit Type: Inpatient Procedure:                Upper GI endoscopy Indications:              Melena Providers:                Iva Boop, MD, Stephens Shire RN, RN, Salley Scarlet, Technician, Penelope Galas, CRNA Referring MD:              Medicines:                Monitored Anesthesia Care Complications:            No immediate complications. Estimated Blood Loss:     Estimated blood loss was minimal. Procedure:                Pre-Anesthesia Assessment:                           - Prior to the procedure, a History and Physical                            was performed, and patient medications and                            allergies were reviewed. The patient's tolerance of                            previous anesthesia was also reviewed. The risks                            and benefits of the procedure and the sedation                            options and risks were discussed with the patient.                            All questions were answered, and informed consent                            was obtained. Prior Anticoagulants: The patient has                            taken no anticoagulant or antiplatelet agents. ASA                            Grade Assessment: III - A patient with severe                            systemic disease. After reviewing the risks and  benefits, the patient was deemed in satisfactory                            condition to undergo the procedure.                           After obtaining informed consent, the endoscope was                            passed under direct vision. Throughout the                            procedure, the patient's blood pressure, pulse, and                             oxygen saturations were monitored continuously. The                            [id number]-[id number] ([id number]) Olympus endoscope was introduced                            through the mouth, and advanced to the second part                            of duodenum. The upper GI endoscopy was                            accomplished without difficulty. The patient                            tolerated the procedure well. Scope In: Scope Out: Findings:      Many cratered gastric ulcers with no stigmata of bleeding were found in       the gastric antrum. The largest lesion was 10 mm in largest dimension.       Biopsies were taken with a cold forceps for histology. Verification of       patient identification for the specimen was done. Estimated blood loss       was minimal.      Many non-bleeding cratered duodenal ulcers with no stigmata of bleeding       were found in the first portion of the duodenum and in the second       portion of the duodenum. The largest lesion was 5 mm in largest       dimension. Biopsies were taken with a cold forceps for histology.       Verification of patient identification for the specimen was done.       Estimated blood loss was minimal.      The exam was otherwise without abnormality.      The cardia and gastric fundus were normal on retroflexion. Impression:               - Gastric ulcers with no stigmata of bleeding.                            Biopsied.                           -  Non-bleeding duodenal ulcers with no stigmata of                            bleeding. Biopsied.                           - The examination was otherwise normal. Recommendation:           - Return patient to hospital ward for ongoing care.                           - Resume regular diet.                           - Await pathology - ? EtOH in origin - had denied                            NSAID. I will check a gastrin (though may be high                            from PPI use) as gastrinoma  could cause (none seen                            on CT)                           No anti-coag at this time - has chronic DVT(s) but                            also has IVC filter and sig bleeding risk remains                            w/ ulcer disease seen Procedure Code(s):        --- Professional ---                           [phone], Esophagogastroduodenoscopy, flexible,                            transoral; with biopsy, single or multiple Diagnosis Code(s):        --- Professional ---                           K25.9, Gastric ulcer, unspecified as acute or                            chronic, without hemorrhage or perforation                           K26.9, Duodenal ulcer, unspecified as acute or                            chronic, without hemorrhage or perforation  K92.1, Melena (includes Hematochezia) CPT copyright 2022 [employer]. All rights reserved. The codes documented in this report are preliminary and upon coder review may  be revised to meet current compliance requirements. Iva Boop, MD 12/16/2022 1:00:10 PM This report has been signed electronically. Number of Addenda: 0

## 2022-12-16 NOTE — Anesthesia Postprocedure Evaluation (Signed)
Anesthesia Post Note  Patient: Edgar Olson  Procedure(s) Performed: ESOPHAGOGASTRODUODENOSCOPY (EGD) WITH PROPOFOL BIOPSY     Patient location during evaluation: PACU Anesthesia Type: MAC Level of consciousness: awake and alert Pain management: pain level controlled Vital Signs Assessment: post-procedure vital signs reviewed and stable Respiratory status: spontaneous breathing, nonlabored ventilation, respiratory function stable and patient connected to nasal cannula oxygen Cardiovascular status: stable and blood pressure returned to baseline Postop Assessment: no apparent nausea or vomiting Anesthetic complications: no  No notable events documented.  Last Vitals:  Vitals:   12/16/22 1252 12/16/22 1310  BP: (!) 123/56 (!) 142/85  Pulse: 84 66  Resp: 16 14  Temp: 36.7 C   SpO2: 96% 99%    Last Pain:  Vitals:   12/16/22 1310  TempSrc:   PainSc: 0-No pain                 Casee Knepp S

## 2022-12-16 NOTE — Anesthesia Preprocedure Evaluation (Signed)
Anesthesia Evaluation  Patient identified by MRN, date of birth, ID band Patient awake    Reviewed: Allergy & Precautions, H&P , NPO status , Patient's Chart, lab work & pertinent test results  Airway Mallampati: II  TM Distance: >3 FB Neck ROM: Full    Dental no notable dental hx.    Pulmonary neg pulmonary ROS, former smoker   Pulmonary exam normal breath sounds clear to auscultation       Cardiovascular hypertension, Normal cardiovascular exam Rhythm:Regular Rate:Normal     Neuro/Psych negative neurological ROS  negative psych ROS   GI/Hepatic negative GI ROS,,,(+)     substance abuse  alcohol use  Endo/Other  negative endocrine ROS    Renal/GU ARFRenal disease  negative genitourinary   Musculoskeletal negative musculoskeletal ROS (+)    Abdominal   Peds negative pediatric ROS (+)  Hematology  (+) Blood dyscrasia, anemia   Anesthesia Other Findings   Reproductive/Obstetrics negative OB ROS                             Anesthesia Physical Anesthesia Plan  ASA: 3  Anesthesia Plan: MAC   Post-op Pain Management:    Induction: Intravenous  PONV Risk Score and Plan: 1 and Propofol infusion and Treatment may vary due to age or medical condition  Airway Management Planned: Simple Face Mask  Additional Equipment:   Intra-op Plan:   Post-operative Plan:   Informed Consent: I have reviewed the patients History and Physical, chart, labs and discussed the procedure including the risks, benefits and alternatives for the proposed anesthesia with the patient or authorized representative who has indicated his/her understanding and acceptance.     Dental advisory given  Plan Discussed with: CRNA and Surgeon  Anesthesia Plan Comments:        Anesthesia Quick Evaluation

## 2022-12-16 NOTE — Care Management Important Message (Signed)
Important Message  Patient Details  Name: Edgar Olson MRN: [id number] Date of Birth: 1960/10/05   Medicare Important Message Given:  Yes     Dorena Bodo 12/16/2022, 2:45 PM
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
## 2022-12-16 NOTE — Transfer of Care (Signed)
Immediate Anesthesia Transfer of Care Note  Patient: Edgar Olson  Procedure(s) Performed: ESOPHAGOGASTRODUODENOSCOPY (EGD) WITH PROPOFOL BIOPSY  Patient Location: Endoscopy Unit  Anesthesia Type:MAC  Level of Consciousness: drowsy and patient cooperative  Airway & Oxygen Therapy: Patient Spontanous Breathing and Patient connected to nasal cannula oxygen  Post-op Assessment: Report given to RN and Post -op Vital signs reviewed and stable  Post vital signs: Reviewed and stable  Last Vitals:  Vitals Value Taken Time  BP 123/56 12/16/22 1252  Temp    Pulse 82 12/16/22 1253  Resp 17 12/16/22 1253  SpO2 98 % 12/16/22 1253  Vitals shown include unfiled device data.  Last Pain:  Vitals:   12/16/22 1220  TempSrc: Tympanic  PainSc: 2       Patients Stated Pain Goal: 0 (12/16/22 1100)  Complications: No notable events documented.

## 2022-12-16 NOTE — Interval H&P Note (Signed)
History and Physical Interval Note:  12/16/2022 12:19 PM  Edgar Olson  has presented today for surgery, with the diagnosis of anemia.  The various methods of treatment have been discussed with the patient and family. After consideration of risks, benefits and other options for treatment, the patient has consented to  Procedure(s): ESOPHAGOGASTRODUODENOSCOPY (EGD) WITH PROPOFOL (N/A) as a surgical intervention.  The patient's history has been reviewed, patient examined, no change in status, stable for surgery.  I have reviewed the patient's chart and labs.  Questions were answered to the patient's satisfaction.     Stan Head

## 2022-12-17 DIAGNOSIS — F1090 Alcohol use, unspecified, uncomplicated: Secondary | ICD-10-CM | POA: Diagnosis not present

## 2022-12-17 DIAGNOSIS — K279 Peptic ulcer, site unspecified, unspecified as acute or chronic, without hemorrhage or perforation: Secondary | ICD-10-CM

## 2022-12-17 DIAGNOSIS — D649 Anemia, unspecified: Secondary | ICD-10-CM | POA: Diagnosis not present

## 2022-12-17 DIAGNOSIS — E871 Hypo-osmolality and hyponatremia: Secondary | ICD-10-CM | POA: Diagnosis not present

## 2022-12-17 DIAGNOSIS — K259 Gastric ulcer, unspecified as acute or chronic, without hemorrhage or perforation: Secondary | ICD-10-CM

## 2022-12-17 DIAGNOSIS — K269 Duodenal ulcer, unspecified as acute or chronic, without hemorrhage or perforation: Secondary | ICD-10-CM

## 2022-12-17 DIAGNOSIS — N179 Acute kidney failure, unspecified: Secondary | ICD-10-CM | POA: Diagnosis not present

## 2022-12-17 LAB — HEMOGLOBIN AND HEMATOCRIT, BLOOD
HCT: 22.6 % — ABNORMAL LOW (ref 39.0–52.0)
Hemoglobin: 7.8 g/dL — ABNORMAL LOW (ref 13.0–17.0)

## 2022-12-17 MED ORDER — SODIUM CHLORIDE 0.9 % IV SOLN: INTRAVENOUS | Status: AC

## 2022-12-17 MED ORDER — SODIUM CHLORIDE 0.9 % IV BOLUS
1000.0000 mL | Freq: Once | INTRAVENOUS | Status: AC
  Administered 2022-12-17: 1000 mL via INTRAVENOUS

## 2022-12-17 NOTE — Progress Notes (Signed)
Visited pt for new L chest pain. He has a history of DVTs, currently b/l chronic deep and superficial per [place] and temporarily off anticoag due to suspected GI bleed. Talking to [name], onset of pain was sudden about half an hour ago, approximately at 31. Pain is on L chest wall, epicenter around the sixth rib, does not radiate, and described as "bruise-like" pain, not crushing or burning. Pain occurs during inhalation only, and is relieved when he exhales or sits upright. A lidocaine patch provided by his nurse is helping the pain. He is not dyspneic. He further denies palpitations, abdominal pain, nausea, vomiting, cough. VSS. On exam, he is in NAD, no increased work of breathing, heart and lungs are clear, pain elicited by palpation. PLAN: Pain appears musculoskeletal. Not suspecting acute lung or heart pathology. His vitals look good, he is not SOB, coughing, nauseous, vomiting, or in distress. Will be on lookout for new dyspnea, hypoxia, change in pain. Lidocaine patch for pain.

## 2022-12-17 NOTE — Plan of Care (Signed)
  Problem: Health Behavior/Discharge Planning: Goal: Ability to manage health-related needs will improve Outcome: Progressing   Problem: Clinical Measurements: Goal: Ability to maintain clinical measurements within normal limits will improve Outcome: Progressing Goal: Will remain free from infection Outcome: Progressing Goal: Respiratory complications will improve Outcome: Progressing   Problem: Activity: Goal: Risk for activity intolerance will decrease Outcome: Progressing   Problem: Pain Managment: Goal: General experience of comfort will improve Outcome: Progressing   Problem: Safety: Goal: Ability to remain free from injury will improve Outcome: Progressing

## 2022-12-17 NOTE — Plan of Care (Signed)
  Problem: Health Behavior/Discharge Planning: Goal: Ability to manage health-related needs will improve Outcome: Progressing   Problem: Nutrition: Goal: Adequate nutrition will be maintained Outcome: Progressing   Problem: Coping: Goal: Level of anxiety will decrease Outcome: Progressing   Problem: Pain Managment: Goal: General experience of comfort will improve Outcome: Progressing   Problem: Safety: Goal: Ability to remain free from injury will improve Outcome: Progressing   
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
## 2022-12-17 NOTE — Progress Notes (Addendum)
Daily Progress Note  DOA: 12/13/2022 Hospital Day: 5 Chief Complaint: Anemia  ASSESSMENT & PLAN   Brief Narrative:  Edgar Olson is a 62 y.o. year old male with a history of  DVTs s/p IVC, Etoh abuse   [place] anemia  / gastric and duodenal ulcers. Denies NSAID use.  - S/p 2 u PRBCs. Hgb stable at 7.8.  Doesn't appear iron deficient.  --Continue  BID Pantoprazole until seen in office. Follow up with me on 10/17 at 10 am  --Await biopsies --Await gastrin level --Outpatient screening colonoscopy - will arranged at follow up appt   Hyponatremia, ? 2/2 to GI losses and possibly beer potomania --Resolved    History of DVTs, s/p IVC. Not anticoagulated, he stopped Eliquis himself months ago.  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    Etoh abuse. Drinking a six pack of beer almost every night  Liver enzymes normal. No liver abnormalities on non-contrast CT AP   AKI in setting of N/V and diarrhea.  Improving. Cr 8.1 >> 5.24 >> 3.78  -----------------------------------------------------------------------------------------------------    [facility] Attending   I have taken an interval history, reviewed the chart and examined the patient. I agree with the Advanced Practitioner's note, impression and recommendations with the following additions:  He is improving  We will f/u pathology/gastrin level here vs outpatient  I do expect gastrin could be elevated - looking for a very high level if has ZE syndrome which is doubtful but felt worthwhile to check  Getting closer to dc  I would not use anti-coag tx given the ulcers - fortunately he has IVC filter  Signing off but available if ? Arise  Iva Boop, MD, [facility] See Loretha Stapler on call - gastroenterology for best contact person 12/17/2022 2:42 PM   Subjective   Feels okay.    Objective   New Events:  EGD 8/7 Gastric ulcers with no stigmata of bleeding.  Biopsied Non-bleeding duodenal ulcers with no stigmata of bleeding. Biopsied. The examination was otherwise normal. - Await pathology    Recent Labs    12/15/22 0423 12/15/22 1321 12/16/22 0137 12/16/22 1007 12/16/22 1831 12/17/22 0159  WBC 5.8  --  6.5  --   --  6.6  HGB 7.1*   < > 6.3* 8.5* 8.1* 7.8*  HCT 20.4*   < > 18.0* 24.5* 23.9* 23.4*  PLT 226  --  195  --   --  220   < > = values in this interval not displayed.   BMET Recent Labs    12/15/22 0423 12/16/22 0137 12/17/22 0159  NA 132* 131* 135  K 3.4* 3.8 4.2  CL 102 102 105  CO2 19* 19* 20*  GLUCOSE 94 97 96  BUN 92* 70* 48*  CREATININE 6.79* 5.24* 3.78*  CALCIUM 8.4* 8.1* 8.5*   LFT Recent Labs    12/16/22 0137 12/17/22 0159  PROT 5.1*  --   ALBUMIN 2.8* 3.2*  AST 15  --   ALT 14  --   ALKPHOS 26*  --   BILITOT 0.4  --      Imaging:  VAS [place] LOWER EXTREMITY VENOUS (DVT)  Lower Venous DVT Study  Patient Name:  Edgar Olson  Date of Exam:   12/14/2022 Medical Rec #: [id number]        Accession #:    [id number] Date of Birth: 1960/07/13        Patient Gender:  M Patient Age:   31 years Exam Location:  [facility] Procedure:      VAS [place] LOWER EXTREMITY VENOUS (DVT) Referring Phys: EMILY MULLEN  --------------------------------------------------------------------------------   Indications: Pain, and Hx of DVT bilaterally.   Comparison Study: Previous LLE 12/02/20 chronic throughout leg.                   Previous RLE 11/12/20 acute whole leg.  Performing Technologist: McKayla Maag RVT, [place]    Examination Guidelines: A complete evaluation includes B-mode imaging, spectral Doppler, color Doppler, and power Doppler as needed of all accessible portions of each vessel. Bilateral testing is considered an integral part of a complete examination. Limited examinations for reoccurring indications may be performed as noted. The reflux portion of the exam is performed with the patient in reverse  Trendelenburg.     +---------+---------------+---------+-----------+----------+--------------+ RIGHT    CompressibilityPhasicitySpontaneityPropertiesThrombus Aging +---------+---------------+---------+-----------+----------+--------------+ CFV      Full           Yes      Yes                                 +---------+---------------+---------+-----------+----------+--------------+ SFJ      Full                                                        +---------+---------------+---------+-----------+----------+--------------+ FV Prox  Full                                                        +---------+---------------+---------+-----------+----------+--------------+ FV Mid   Partial        Yes      Yes                  Chronic        +---------+---------------+---------+-----------+----------+--------------+ FV DistalPartial        Yes      Yes                  Chronic        +---------+---------------+---------+-----------+----------+--------------+ PFV      Full                                                        +---------+---------------+---------+-----------+----------+--------------+ POP      Partial        Yes      Yes                  Chronic        +---------+---------------+---------+-----------+----------+--------------+ PTV      Full                                                        +---------+---------------+---------+-----------+----------+--------------+ PERO  Full                                                        +---------+---------------+---------+-----------+----------+--------------+        +---------+---------------+---------+-----------+----------+--------------+ LEFT     CompressibilityPhasicitySpontaneityPropertiesThrombus Aging +---------+---------------+---------+-----------+----------+--------------+ CFV      Full           Yes      Yes                                  +---------+---------------+---------+-----------+----------+--------------+ SFJ      Full                                                        +---------+---------------+---------+-----------+----------+--------------+ FV Prox  Partial        Yes      Yes                  Chronic        +---------+---------------+---------+-----------+----------+--------------+ FV Mid   Partial        Yes      Yes                  Chronic        +---------+---------------+---------+-----------+----------+--------------+ FV DistalPartial        Yes      Yes                  Chronic        +---------+---------------+---------+-----------+----------+--------------+ PFV      Full                                                        +---------+---------------+---------+-----------+----------+--------------+ POP      Partial        Yes      Yes                  Chronic        +---------+---------------+---------+-----------+----------+--------------+ PTV      Full                                                        +---------+---------------+---------+-----------+----------+--------------+ PERO                                                  Not visualized +---------+---------------+---------+-----------+----------+--------------+ GSV      Partial        Yes      Yes                  Chronic        +---------+---------------+---------+-----------+----------+--------------+  Summary: RIGHT: - Findings consistent with chronic deep vein thrombosis involving the right mid and distal femoral veins, and right popliteal vein. - No cystic structure found in the popliteal fossa.   LEFT: - Findings consistent with chronic deep vein thrombosis involving the left proximal, mid and distal femoral veins, and left popliteal vein. 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 - No cystic structure found in the popliteal fossa.    *See table(s) above for measurements and observations.  Electronically signed by Gerarda Fraction on 12/14/2022 at 7:43:45 PM.      Final       Scheduled inpatient medications:   sodium chloride   Intravenous Once   folic acid  1 mg Oral Daily   lidocaine  1 patch Transdermal Q24H   melatonin  3 mg Oral QHS   multivitamin with minerals  1 tablet Oral Daily   pantoprazole  40 mg Oral BID AC   thiamine  100 mg Oral Daily   Or   thiamine  100 mg Intravenous Daily   Continuous inpatient infusions:   sodium chloride 20 mL/hr at 12/17/22 0818   PRN inpatient medications: acetaminophen, mouth rinse  Vital signs in last 24 hours: Temp:  [98 F (36.7 C)-98.4 F (36.9 C)] 98.1 F (36.7 C) (08/08 0440) Pulse Rate:  [62-84] 62 (08/08 0440) Resp:  [14-19] 16 (08/08 0440) BP: (123-161)/(56-88) 127/68 (08/08 0440) SpO2:  [96 %-100 %] 99 % (08/08 0440) Last BM Date : 12/16/22  Intake/Output Summary (Last 24 hours) at 12/17/2022 0935 Last data filed at 12/17/2022 0440 Gross per 24 hour  Intake 2119.24 ml  Output 300 ml  Net 1819.24 ml    Intake/Output from previous day: 08/07 0701 - 08/08 0700 In: 2469.2 [P.O.:1500; I.V.:619.2; Blood:350] Out: 300 [Urine:300] Intake/Output this shift: No intake/output data recorded.   Physical Exam:  General: Alert male in NAD Heart:  Regular rate and rhythm.  Pulmonary: Normal respiratory effort Abdomen: Soft, nondistended, nontender. Normal bowel sounds. Extremities: No lower extremity edema  Neurologic: Alert and oriented Psych: Pleasant. Cooperative. Insight appears normal.    Principal Problem:   Acute renal failure (ARF) (HCC) Active Problems:   Normocytic anemia   Hyponatremia     LOS: 4 days   Willette Cluster ,NP 12/17/2022, 9:35 AM

## 2022-12-18 ENCOUNTER — Other Ambulatory Visit (HOSPITAL_COMMUNITY): Payer: Self-pay

## 2022-12-18 DIAGNOSIS — N179 Acute kidney failure, unspecified: Secondary | ICD-10-CM | POA: Diagnosis not present

## 2022-12-18 MED ORDER — PANTOPRAZOLE SODIUM 40 MG PO TBEC
40.0000 mg | DELAYED_RELEASE_TABLET | Freq: Two times a day (BID) | ORAL | 0 refills | Status: DC
  Filled 2022-12-18: qty 60, 30d supply, fill #0

## 2022-12-18 MED ORDER — SODIUM CHLORIDE 0.9 % IV BOLUS
1000.0000 mL | Freq: Once | INTRAVENOUS | Status: AC
  Administered 2022-12-18: 1000 mL via INTRAVENOUS

## 2022-12-18 NOTE — TOC Transition Note (Signed)
Transition of Care [facility]) - CM/SW Discharge Note   Patient Details  Name: Edgar Olson MRN: [id number] Date of Birth: 02/26/1961  Transition of [facility]) CM/SW Contact:  Tom-Johnson, Hershal Coria, RN Phone Number: 12/18/2022, 11:25 AM   Clinical Narrative:     Patient is scheduled for discharge today.  Readmission Risk Assessment done. Outpatient f/u, hospital f/u and discharge instructions on AVS. Prescriptions sent to [facility] pharmacy and meds will be delivered to patient at bedside prior discharge. No TOC needs or recommendations noted. Son, Juwann Swansen. to transport at discharge.  No further TOC needs noted.       Final next level of care: Home/Self Care Barriers to Discharge: Barriers Resolved   Patient Goals and CMS Choice CMS Medicare.gov Compare Post Acute Care list provided to:: Patient Choice offered to / list presented to : NA  Discharge Placement                  Patient to be transferred to facility by: Son Name of family member notified: Nic Lerner.    Discharge Plan and Services Additional resources added to the After Visit Summary for                  DME Arranged: N/A DME Agency: NA       HH Arranged: NA HH Agency: NA        Social Determinants of Health (SDOH) Interventions SDOH Screenings   Food Insecurity: Food Insecurity Present (12/13/2022)  Housing: Medium Risk (12/13/2022)  Transportation Needs: No Transportation Needs (12/13/2022)  Utilities: Not At Risk (12/13/2022)  Depression (PHQ2-9): Low Risk  (11/27/2020)  Tobacco Use: Medium Risk (12/16/2022)     Readmission Risk Interventions    12/14/2022    4:39 PM 11/18/2020   12:17 PM  Readmission Risk Prevention Plan  Post Dischage Appt Complete Complete  Medication Screening Complete Complete  Transportation Screening Complete Complete

## 2022-12-18 NOTE — Progress Notes (Signed)
PIV removed. AVS reviewed. Patient verbalizes understanding of necessary medication regimen and follow up appointments.  
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
## 2022-12-18 NOTE — Discharge Instructions (Addendum)
You were hospitalized for acute renal failure, and worked up for a suspected GI bleed. You were treated with IV fluids, and given blood transfusions when your hemoglobin level became low. Your labs indicating kidney function have improved, and your hemoglobin is now stable. I feel comfortable discharging you from the hospital with outpatient follow-up. Thank you for allowing [place] to be part of your care.   We arranged for you to follow up at: [facility] on August 27 at 9:45am  And with [facility] at 10/17 at 10am   Please note these changes made to your medications:   *Please START taking:  Pantoprazole 40mg  twice per day   Please make sure to return to the hospital if you are feeling dizzy or lightheaded.   Please call our clinic if you have any questions or concerns, we may be able to help and keep you from a long and expensive emergency room wait. Our clinic and after hours phone number is [phone], the best time to call is Monday through Friday 9 am to 4 pm but there is always someone available 24/7 if you have an emergency. If you need medication refills please notify your pharmacy one week in advance and they will send [place] a request.

## 2022-12-20 ENCOUNTER — Encounter (HOSPITAL_COMMUNITY): Payer: Self-pay | Admitting: Internal Medicine

## 2022-12-25 ENCOUNTER — Other Ambulatory Visit: Payer: Self-pay

## 2022-12-25 DIAGNOSIS — K269 Duodenal ulcer, unspecified as acute or chronic, without hemorrhage or perforation: Secondary | ICD-10-CM

## 2022-12-25 DIAGNOSIS — K259 Gastric ulcer, unspecified as acute or chronic, without hemorrhage or perforation: Secondary | ICD-10-CM

## 2023-01-05 ENCOUNTER — Ambulatory Visit: Payer: Medicare Other | Admitting: Student

## 2023-01-08 ENCOUNTER — Inpatient Hospital Stay: Payer: Medicare Other | Admitting: Family Medicine

## 2023-01-26 ENCOUNTER — Telehealth: Payer: Self-pay | Admitting: General Practice

## 2023-01-26 ENCOUNTER — Inpatient Hospital Stay: Payer: Medicare Other | Admitting: Family Medicine

## 2023-01-26 NOTE — Telephone Encounter (Signed)
Called patient to reschedule missed appointment.

## 2023-02-25 ENCOUNTER — Ambulatory Visit: Payer: Medicare Other | Admitting: Nurse Practitioner

## 2023-05-01 ENCOUNTER — Other Ambulatory Visit: Payer: Self-pay

## 2023-05-01 ENCOUNTER — Emergency Department (HOSPITAL_COMMUNITY)
Admission: EM | Admit: 2023-05-01 | Discharge: 2023-05-01 | Disposition: A | Discharge status: Home / Self Care | Payer: Medicare Other | Attending: Emergency Medicine | Admitting: Emergency Medicine

## 2023-05-01 DIAGNOSIS — S70311A Abrasion, right thigh, initial encounter: Secondary | ICD-10-CM | POA: Insufficient documentation

## 2023-05-01 DIAGNOSIS — Z23 Encounter for immunization: Secondary | ICD-10-CM | POA: Diagnosis not present

## 2023-05-01 DIAGNOSIS — S51811A Laceration without foreign body of right forearm, initial encounter: Secondary | ICD-10-CM | POA: Insufficient documentation

## 2023-05-01 DIAGNOSIS — W540XXA Bitten by dog, initial encounter: Secondary | ICD-10-CM | POA: Insufficient documentation

## 2023-05-01 MED ORDER — BACITRACIN ZINC 500 UNIT/GM EX OINT
TOPICAL_OINTMENT | CUTANEOUS | Status: AC
  Filled 2023-05-01: qty 3.6

## 2023-05-01 MED ORDER — AMOXICILLIN-POT CLAVULANATE 875-125 MG PO TABS
1.0000 | ORAL_TABLET | Freq: Once | ORAL | Status: AC
  Administered 2023-05-01: 1 via ORAL
  Filled 2023-05-01: qty 1

## 2023-05-01 MED ORDER — AMOXICILLIN-POT CLAVULANATE 875-125 MG PO TABS: 1.0000 | ORAL_TABLET | Freq: Two times a day (BID) | ORAL | 0 refills | Status: DC

## 2023-05-01 MED ORDER — TETANUS-DIPHTH-ACELL PERTUSSIS 5-2.5-18.5 LF-MCG/0.5 IM SUSY
0.5000 mL | PREFILLED_SYRINGE | Freq: Once | INTRAMUSCULAR | Status: AC
  Administered 2023-05-01: 0.5 mL via INTRAMUSCULAR
  Filled 2023-05-01: qty 0.5

## 2023-05-01 MED ORDER — LIDOCAINE HCL (PF) 1 % IJ SOLN
5.0000 mL | Freq: Once | INTRAMUSCULAR | Status: AC
  Administered 2023-05-01: 5 mL via INTRADERMAL
  Filled 2023-05-01: qty 5

## 2023-05-01 NOTE — ED Notes (Signed)
Pt ambulated to lobby to wait on his son for transportation. Dressing change supplies provided x 1 day. Current dressing intact and bleeding under control. Pt instructed on how to change dressing if needed.

## 2023-05-01 NOTE — ED Notes (Signed)
Cleaned bites w/ sterile water, applied ointment, xeroform gauze and gauze dressing. Pt tolerated well.

## 2023-05-01 NOTE — ED Triage Notes (Signed)
Pt w/ bilateral forearm and hand puncture wounds from dog bite. Dog has been placed w/ animal control. Pt A&Ox4. Pt reports 6/10 pain level.

## 2023-05-01 NOTE — Discharge Instructions (Addendum)
Take the prescribed medication as directed. Monitor wounds for redness, swelling, drainage, fever-- return to ED if this develops. Sutures will need to be removed in about 1 week.  Can follow-up here or local urgent care to have this done. Return here for new concerns.

## 2023-05-01 NOTE — ED Provider Notes (Signed)
[place] EMERGENCY DEPARTMENT AT [facility] Provider Note   CSN: [id number] Arrival date & time: 05/01/23  0040     History  Chief Complaint  Patient presents with   Animal Bite    Edgar Olson is a 62 y.o. male.  The history is provided by the patient and medical records.  Animal Bite  62 year old male with history of DVT with IVC filter in place, not on anticoagulation, presenting to the ED with dog bite.  States his son's dog who is a boxer/pitbull mix attacked him tonight which she has never done before.  He sustained multiple puncture wounds to bilateral forearms and a smaller laceration to right forearm.  Dog's vaccines are up-to-date and is currently in animal control custody.  Unsure of last tetanus.  Home Medications Prior to Admission medications   Medication Sig Start Date End Date Taking? Authorizing Provider  amoxicillin-clavulanate (AUGMENTIN) 875-125 MG tablet Take 1 tablet by mouth every 12 (twelve) hours. 05/01/23  Yes Garlon Hatchet, PA-C  pantoprazole (PROTONIX) 40 MG tablet Take 1 tablet (40 mg total) by mouth 2 (two) times daily before a meal. 12/18/22 01/17/23  Monna Fam, MD      Allergies    Patient has no known allergies.    Review of Systems   Review of Systems  Musculoskeletal:  Positive for arthralgias.  Skin:  Positive for wound.  All other systems reviewed and are negative.   Physical Exam Updated Vital Signs BP (!) 142/92 (BP Location: Right Arm)   Pulse 70   Temp 99.3 F (37.4 C) (Oral)   Resp 16   Ht 6\' 1"  (1.854 m)   Wt 99.8 kg   SpO2 100%   BMI 29.03 kg/m   Physical Exam Vitals and nursing note reviewed.  Constitutional:      Appearance: He is well-developed.  HENT:     Head: Normocephalic and atraumatic.  Eyes:     Conjunctiva/sclera: Conjunctivae normal.     Pupils: Pupils are equal, round, and reactive to light.  Cardiovascular:     Rate and Rhythm: Normal rate and regular rhythm.     Heart sounds:  Normal heart sounds.  Pulmonary:     Effort: Pulmonary effort is normal.     Breath sounds: Normal breath sounds.  Abdominal:     General: Bowel sounds are normal.     Palpations: Abdomen is soft.  Musculoskeletal:        General: Normal range of motion.     Cervical back: Normal range of motion.     Comments: Numerous puncture wounds to bilateral forearms, more so on right arm, 3 cm laceration to right dorsal/lateral forearm that is slightly gaping without significant bleeding, there are no bony deformities, normal sensation throughout both arms Superficial abrasions to right distal anterior thigh  Skin:    General: Skin is warm and dry.  Neurological:     Mental Status: He is alert and oriented to person, place, and time.     ED Results / Procedures / Treatments   Labs (all labs ordered are listed, but only abnormal results are displayed) Labs Reviewed - No data to display  EKG None  Radiology No results found.  Procedures Procedures    LACERATION REPAIR Performed by: Garlon Hatchet Authorized by: Garlon Hatchet Consent: Verbal consent obtained. Risks and benefits: risks, benefits and alternatives were discussed Consent given by: patient Patient identity confirmed: provided demographic data Prepped and Draped in normal sterile fashion  Wound explored  Laceration Location: right dorsal forearm  Laceration Length: 3cm  No Foreign Bodies seen or palpated  Anesthesia: local infiltration  Local anesthetic: lidocaine 1% without epinephrine  Anesthetic total: 4 ml  Irrigation method: syringe Amount of cleaning: standard  Skin closure: 4-0 prolene  Number of sutures: 3  Technique: simple interrupted, loose approximation intentionally  Patient tolerance: Patient tolerated the procedure well with no immediate complications.   Medications Ordered in ED Medications  Tdap (BOOSTRIX) injection 0.5 mL (0.5 mLs Intramuscular Given 05/01/23 0110)   amoxicillin-clavulanate (AUGMENTIN) 875-125 MG per tablet 1 tablet (1 tablet Oral Given 05/01/23 0113)  lidocaine (PF) (XYLOCAINE) 1 % injection 5 mL (5 mLs Intradermal Given by Other 05/01/23 0115)  bacitracin 500 UNIT/GM ointment (  Given 05/01/23 0203)    ED Course/ Medical Decision Making/ A&P                                 Medical Decision Making Risk Prescription drug management.   62 year old male presenting to the ED after dog bite.  Sons dog attacked him tonight, has never happened before.  He is up-to-date on vaccines.  Patient is unsure of last tetanus shot.  He has numerous puncture wounds to bilateral forearms, right is worse than left.  Has 3 cm gaping laceration to dorsal/lateral forearm.  Wounds are overall hemostatic.  Tetanus was updated.  Gaping laceration was loosely approximated.  Will start on Augmentin, first dose given here.  Dog is in animal control custody so will be monitored.  Will hold off on rabies vaccines for now.  Wound care provided and arms were bandaged.  We discussed monitoring for worsening swelling, redness, drainage, etc.  Will need suture removal in 1 week.  Return here for new concerns.  Final Clinical Impression(s) / ED Diagnoses Final diagnoses:  Dog bite, initial encounter    Rx / DC Orders ED Discharge Orders          Ordered    amoxicillin-clavulanate (AUGMENTIN) 875-125 MG tablet  Every 12 hours        05/01/23 0151              Garlon Hatchet, PA-C 05/01/23 0237    Nira Conn, MD 05/01/23 [phone]

## 2023-05-18 ENCOUNTER — Other Ambulatory Visit: Payer: Self-pay

## 2023-05-18 ENCOUNTER — Encounter (HOSPITAL_COMMUNITY): Payer: Self-pay

## 2023-05-18 ENCOUNTER — Emergency Department (HOSPITAL_COMMUNITY)
Admission: EM | Admit: 2023-05-18 | Discharge: 2023-05-18 | Disposition: A | Discharge status: Home / Self Care | Payer: Medicare Other | Attending: Emergency Medicine | Admitting: Emergency Medicine

## 2023-05-18 DIAGNOSIS — S51811D Laceration without foreign body of right forearm, subsequent encounter: Secondary | ICD-10-CM | POA: Insufficient documentation

## 2023-05-18 DIAGNOSIS — Z4802 Encounter for removal of sutures: Secondary | ICD-10-CM | POA: Insufficient documentation

## 2023-05-18 DIAGNOSIS — X58XXXD Exposure to other specified factors, subsequent encounter: Secondary | ICD-10-CM | POA: Diagnosis not present

## 2023-05-18 NOTE — ED Provider Notes (Signed)
 [place] [facility] DEPARTMENT AT [facility] Provider Note   CSN: [id number] Arrival date & time: 05/18/23  1605     History  Chief Complaint  Patient presents with   Suture / Staple Removal    Caylan Schifano is a 63 y.o. male.  Patient is a 63 year old male returning today for suture removal.  He had a dog bite he reports about 12 days ago and had 3 sutures placed.  He reports has been doing very well and he thinks it is healing fine.  The history is provided by the patient.  Suture / Staple Removal       Home Medications Prior to Admission medications   Medication Sig Start Date End Date Taking? Authorizing Provider  amoxicillin -clavulanate (AUGMENTIN ) 875-125 MG tablet Take 1 tablet by mouth every 12 (twelve) hours. 05/01/23   Jarold Olam HERO, PA-C  pantoprazole  (PROTONIX ) 40 MG tablet Take 1 tablet (40 mg total) by mouth 2 (two) times daily before a meal. 12/18/22 01/17/23  Francella Rogue, MD      Allergies    Patient has no known allergies.    Review of Systems   Review of Systems  Physical Exam Updated Vital Signs BP (!) 145/83 (BP Location: Right Arm)   Pulse 88   Temp 98.2 F (36.8 C) (Oral)   Resp 16   Ht 6' 1 (1.854 m)   Wt 99.8 kg   SpO2 99%   BMI 29.03 kg/m  Physical Exam Cardiovascular:     Rate and Rhythm: Normal rate.  Pulmonary:     Effort: Pulmonary effort is normal.  Musculoskeletal:     Comments: Healing wound on the right forearm with 3 intact sutures with no surrounding induration or signs of infection.  Mild reactive erythema  Skin:    General: Skin is warm.  Neurological:     Mental Status: He is alert. Mental status is at baseline.     ED Results / Procedures / Treatments   Labs (all labs ordered are listed, but only abnormal results are displayed) Labs Reviewed - No data to display  EKG None  Radiology No results found.  Procedures Suture Removal  Date/Time: 05/18/2023 6:14 PM  Performed by: Doretha Folks, MD Authorized by: Doretha Folks, MD   Consent:    Consent obtained:  Verbal   Consent given by:  Patient Universal protocol:    Patient identity confirmed:  Verbally with patient Location:    Location:  Upper extremity   Upper extremity location:  Arm   Arm location:  R lower arm Procedure details:    Wound appearance:  No signs of infection, good wound healing and clean   Number of sutures removed:  3 Post-procedure details:    Post-removal:  No dressing applied   Procedure completion:  Tolerated     Medications Ordered in ED Medications - No data to display  ED Course/ Medical Decision Making/ A&P                                 Medical Decision Making  Sutures were removed no evidence of wound infection at this time.        Final Clinical Impression(s) / ED Diagnoses Final diagnoses:  Visit for suture removal    Rx / DC Orders ED Discharge Orders     None         Doretha Folks, MD 05/18/23 1815

## 2023-05-18 NOTE — ED Triage Notes (Signed)
 Pt is here to have 3 sutures removed from his right anterior forearm that he had placed 17 days ago on 12/21. Wound appears red.

## 2023-05-29 ENCOUNTER — Emergency Department (HOSPITAL_COMMUNITY)
Admission: EM | Admit: 2023-05-29 | Discharge: 2023-05-29 | Disposition: A | Discharge status: Home / Self Care | Payer: Medicare Other | Attending: Emergency Medicine | Admitting: Emergency Medicine

## 2023-05-29 ENCOUNTER — Other Ambulatory Visit: Payer: Self-pay

## 2023-05-29 ENCOUNTER — Emergency Department (HOSPITAL_COMMUNITY): Payer: Medicare Other

## 2023-05-29 ENCOUNTER — Encounter (HOSPITAL_COMMUNITY): Payer: Self-pay

## 2023-05-29 DIAGNOSIS — S51811A Laceration without foreign body of right forearm, initial encounter: Secondary | ICD-10-CM | POA: Insufficient documentation

## 2023-05-29 DIAGNOSIS — S59911A Unspecified injury of right forearm, initial encounter: Secondary | ICD-10-CM | POA: Diagnosis present

## 2023-05-29 DIAGNOSIS — W540XXA Bitten by dog, initial encounter: Secondary | ICD-10-CM | POA: Diagnosis not present

## 2023-05-29 MED ORDER — BACITRACIN ZINC 500 UNIT/GM EX OINT: 1.0000 | TOPICAL_OINTMENT | Freq: Two times a day (BID) | CUTANEOUS | 0 refills | Status: DC

## 2023-05-29 MED ORDER — AMOXICILLIN-POT CLAVULANATE 875-125 MG PO TABS: 1.0000 | ORAL_TABLET | Freq: Two times a day (BID) | ORAL | 0 refills | Status: DC

## 2023-05-29 MED ORDER — AMOXICILLIN-POT CLAVULANATE 875-125 MG PO TABS
1.0000 | ORAL_TABLET | Freq: Once | ORAL | Status: AC
  Administered 2023-05-29: 1 via ORAL
  Filled 2023-05-29: qty 1

## 2023-05-29 NOTE — ED Triage Notes (Signed)
Pt states he was bit by her neighbors dog last night, dog is UTD on vaccinations. Pt states he has one big laceration and a few puncture wounds. Bleeding controlled with gauze.

## 2023-05-29 NOTE — Discharge Instructions (Signed)
1.  Leave the current dressing in place for 3 days.  It has an antibiotic ointment gauze over the wounds.  If you are having significantly increasing pain, redness or swelling remove the gauze to check and make sure the wound is not looking like it is becoming infected.  If it is getting red, swollen and painful, return to the emergency department immediately for recheck. 2.  The wound was extensively irrigated.  Due to of small puncture into a deep space and the time since the bite wound, the wound has been left open to try to avoid developing a deeper infection that is trapped within your forearm.  This will need very close monitoring.  With good wound care, the wound may close with scar tissue and not require further treatment.  However, it is relatively deep and if there is not adequate skin coverage healing, you may require secondary procedure to remove some tissue and repair with sutures.  This will have to be done by a specialist. Ideally, you should try to get a follow-up appointment with the hand surgeon.  Have given contact information for [facility].  They are on-call today for hand specialist.  Call their office on the next business day to schedule a follow-up.  If you cannot be seen for recheck, return to the emergency department for recheck. 3.  Your tetanus was updated when you were last seen in the emergency department 12\21\24.  You were given a dose of Augmentin in the emergency department.  Fill your prescription and take a second dose late tonight and then start twice daily with your next dose in the morning.  To elevate the arm is much as possible to help with swelling.  If you leave the arm hanging at your side for prolonged periods of time this will increase swelling and may slow healing as well.  Try to elevate on a stack of pillows so that your arm is ideally above the level of your heart.

## 2023-05-29 NOTE — ED Notes (Signed)
Dressing changed/wound irrigated by MD. Patient tolerated well.

## 2023-05-29 NOTE — ED Provider Notes (Signed)
[place] EMERGENCY DEPARTMENT AT [facility] Provider Note   CSN: [id number] Arrival date & time: 05/29/23  1312     History  Chief Complaint  Patient presents with   Animal Bite    Edgar Olson is a 63 y.o. male.  HPI Patient sustained a dog bite last night.  Reports he did not come to the emergency department at that time due to transportation problems.  Reports it is a known dog who belongs to the neighbors and is up-to-date on vaccinations.  Patient reports he did clean the wound with water and soap last night and placed a dressing.  He did not place an antibiotic ointment.  Denies any weakness or numbness to the hand.    Home Medications Prior to Admission medications   Medication Sig Start Date End Date Taking? Authorizing Provider  amoxicillin-clavulanate (AUGMENTIN) 875-125 MG tablet Take 1 tablet by mouth every 12 (twelve) hours. 05/29/23  Yes Arby Barrette, MD  bacitracin ointment Apply 1 Application topically 2 (two) times daily. 05/29/23  Yes Arby Barrette, MD  amoxicillin-clavulanate (AUGMENTIN) 875-125 MG tablet Take 1 tablet by mouth every 12 (twelve) hours. 05/01/23   Garlon Hatchet, PA-C  pantoprazole (PROTONIX) 40 MG tablet Take 1 tablet (40 mg total) by mouth 2 (two) times daily before a meal. 12/18/22 01/17/23  Monna Fam, MD      Allergies    Patient has no known allergies.    Review of Systems   Review of Systems  Physical Exam Updated Vital Signs BP 129/77   Pulse 74   Temp 98.2 F (36.8 C) (Oral)   Resp 18   Ht 6\' 1"  (1.854 m)   Wt 99.8 kg   SpO2 94%   BMI 29.03 kg/m  Physical Exam Constitutional:      Comments: Alert nontoxic well in appearance.  Clear mental status.  Pulmonary:     Effort: Pulmonary effort is normal.  Musculoskeletal:     Comments: Patient has combination of deep abrasion and laceration to the dorsal surface of the forearm on the right.  Just wound is over the forearm dorsum about 5 cm distal to the  elbow.  It is approximately 6 cm in length with a deeper puncture at the more distal and that goes to the whole body.  There is a opening that appears to be about 3 to 4 mm round.  There is no active bleeding or drainage.  The clean muscle body can be visualized through this very small puncture.  Manipulating this around, I do not see any actual trauma to the muscle.  No drainage no discharge or active bleeding.  It appears clean.  Remainder of this particular laceration is a deep avulsion type abrasion with fat tissue at the most distal deeper and and then comes more superficial to the dermis level.  It is predominantly an avulsion there is really not any tissue for closure.  The other associated laceration slightly more distal again linear and more of a superficial skin tear of the same general depth just to the more superficial dermis.  Moist without active bleeding or drainage.  The soft tissues do not appear to have a lot of crush contusion damage in the sense that there is not any extensive bruising and there is no induration currently.  The hand is warm and dry.  Excellent strength.  No pain or difficulty with full range of motion of the wrist and the hand.  No involvement over the elbow  or any joint spaces.  Skin:    General: Skin is warm and dry.  Neurological:     General: No focal deficit present.     Mental Status: He is oriented to person, place, and time.     Coordination: Coordination normal.  Psychiatric:        Mood and Affect: Mood normal.     ED Results / Procedures / Treatments   Labs (all labs ordered are listed, but only abnormal results are displayed) Labs Reviewed - No data to display  EKG None  Radiology DG Forearm Right Result Date: 05/29/2023 CLINICAL DATA:  Dog bite. EXAM: RIGHT FOREARM - 2 VIEW COMPARISON:  None Available. FINDINGS: No signs of acute fracture or dislocation. No significant arthropathy identified. No radiopaque foreign bodies identified. IMPRESSION:  Negative. Electronically Signed   By: Signa Kell M.D.   On: 05/29/2023 16:58    Procedures Procedures   I extensively cleaned and dressed the laceration.  Laceration was cleaned with wound cleanser with extensive spraying and cleaning with 4 x 4.  1 L normal saline used to clean and irrigate the puncture wound.  Subsequently placed bacitracin ointment over moist wounds and then Xeroform covered by 4 x 4 and Kerlix. Medications Ordered in ED Medications  amoxicillin-clavulanate (AUGMENTIN) 875-125 MG per tablet 1 tablet (1 tablet Oral Given 05/29/23 1744)    ED Course/ Medical Decision Making/ A&P                                 Medical Decision Making Amount and/or Complexity of Data Reviewed Radiology: ordered.   Presents with dog bite as outlined.  This happened yesterday evening at this time patient is presenting with delayed wound.  Combination of delay and also a puncture to the deeper space of the muscle body have increased risk for infection.  At this time there is no sign of secondary infection.  Wound was extensively cleaned but not closed due to concern for increased risk of deeper space forearm infection with closure.  Patient is placed on Augmentin and given instructions for very careful wound care and close follow-up and low threshold for return.  Patient voices understanding.  Patient had tetanus updated upon a visit in December 24.        Final Clinical Impression(s) / ED Diagnoses Final diagnoses:  Dog bite, initial encounter    Rx / DC Orders ED Discharge Orders          Ordered    amoxicillin-clavulanate (AUGMENTIN) 875-125 MG tablet  Every 12 hours        05/29/23 1740    bacitracin ointment  2 times daily        05/29/23 1741              Arby Barrette, MD 05/29/23 1753

## 2023-05-29 NOTE — ED Notes (Signed)
Dressing unwrapped. Suture cart at bedside. MD notified.
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
## 2024-04-28 ENCOUNTER — Emergency Department (HOSPITAL_COMMUNITY)

## 2024-04-28 ENCOUNTER — Observation Stay (HOSPITAL_COMMUNITY)
Admission: EM | Admit: 2024-04-28 | Discharge: 2024-04-30 | Disposition: A | Attending: Internal Medicine | Admitting: Internal Medicine

## 2024-04-28 ENCOUNTER — Other Ambulatory Visit: Payer: Self-pay

## 2024-04-28 ENCOUNTER — Encounter (HOSPITAL_COMMUNITY): Payer: Self-pay

## 2024-04-28 DIAGNOSIS — D72829 Elevated white blood cell count, unspecified: Secondary | ICD-10-CM | POA: Diagnosis not present

## 2024-04-28 DIAGNOSIS — Z79899 Other long term (current) drug therapy: Secondary | ICD-10-CM | POA: Insufficient documentation

## 2024-04-28 DIAGNOSIS — R7401 Elevation of levels of liver transaminase levels: Secondary | ICD-10-CM | POA: Insufficient documentation

## 2024-04-28 DIAGNOSIS — F1290 Cannabis use, unspecified, uncomplicated: Secondary | ICD-10-CM | POA: Diagnosis not present

## 2024-04-28 DIAGNOSIS — F1010 Alcohol abuse, uncomplicated: Secondary | ICD-10-CM | POA: Diagnosis not present

## 2024-04-28 DIAGNOSIS — I10 Essential (primary) hypertension: Secondary | ICD-10-CM | POA: Diagnosis not present

## 2024-04-28 DIAGNOSIS — Z86718 Personal history of other venous thrombosis and embolism: Secondary | ICD-10-CM | POA: Insufficient documentation

## 2024-04-28 DIAGNOSIS — R569 Unspecified convulsions: Secondary | ICD-10-CM | POA: Diagnosis present

## 2024-04-28 DIAGNOSIS — L723 Sebaceous cyst: Secondary | ICD-10-CM | POA: Diagnosis not present

## 2024-04-28 LAB — CBC WITH DIFFERENTIAL/PLATELET
Abs Immature Granulocytes: 0.07 K/uL (ref 0.00–0.07)
Basophils Absolute: 0 K/uL (ref 0.0–0.1)
Basophils Relative: 0 %
Eosinophils Absolute: 0.1 K/uL (ref 0.0–0.5)
Eosinophils Relative: 1 %
HCT: 48.5 % (ref 39.0–52.0)
Hemoglobin: 16.1 g/dL (ref 13.0–17.0)
Immature Granulocytes: 1 %
Lymphocytes Relative: 6 %
Lymphs Abs: 0.8 K/uL (ref 0.7–4.0)
MCH: 28.6 pg (ref 26.0–34.0)
MCHC: 33.2 g/dL (ref 30.0–36.0)
MCV: 86.1 fL (ref 80.0–100.0)
Monocytes Absolute: 1 K/uL (ref 0.1–1.0)
Monocytes Relative: 7 %
Neutro Abs: 11.8 K/uL — ABNORMAL HIGH (ref 1.7–7.7)
Neutrophils Relative %: 85 %
Platelets: 222 K/uL (ref 150–400)
RBC: 5.63 MIL/uL (ref 4.22–5.81)
RDW: 16 % — ABNORMAL HIGH (ref 11.5–15.5)
WBC: 13.7 K/uL — ABNORMAL HIGH (ref 4.0–10.5)
nRBC: 0 % (ref 0.0–0.2)

## 2024-04-28 LAB — COMPREHENSIVE METABOLIC PANEL WITH GFR
ALT: 62 U/L — ABNORMAL HIGH (ref 0–44)
AST: 202 U/L — ABNORMAL HIGH (ref 15–41)
Albumin: 5.1 g/dL — ABNORMAL HIGH (ref 3.5–5.0)
Alkaline Phosphatase: 66 U/L (ref 38–126)
Anion gap: 20 — ABNORMAL HIGH (ref 5–15)
BUN: 14 mg/dL (ref 8–23)
CO2: 22 mmol/L (ref 22–32)
Calcium: 10 mg/dL (ref 8.9–10.3)
Chloride: 94 mmol/L — ABNORMAL LOW (ref 98–111)
Creatinine, Ser: 1.27 mg/dL — ABNORMAL HIGH (ref 0.61–1.24)
GFR, Estimated: >60 mL/min
Glucose, Bld: 123 mg/dL — ABNORMAL HIGH (ref 70–99)
Potassium: 4.2 mmol/L (ref 3.5–5.1)
Sodium: 136 mmol/L (ref 135–145)
Total Bilirubin: 0.8 mg/dL (ref 0.0–1.2)
Total Protein: 8.3 g/dL — ABNORMAL HIGH (ref 6.5–8.1)

## 2024-04-28 LAB — ETHANOL: Alcohol, Ethyl (B): <15 mg/dL

## 2024-04-28 MED ORDER — ONDANSETRON HCL 4 MG/2ML IJ SOLN: 4.0000 mg | Freq: Four times a day (QID) | INTRAMUSCULAR | Status: DC | PRN

## 2024-04-28 MED ORDER — SODIUM CHLORIDE 0.9 % IV BOLUS
500.0000 mL | Freq: Once | INTRAVENOUS | Status: AC
  Administered 2024-04-28: 500 mL via INTRAVENOUS

## 2024-04-28 MED ORDER — ADULT MULTIVITAMIN W/MINERALS CH
1.0000 | ORAL_TABLET | Freq: Every day | ORAL | Status: DC
  Administered 2024-04-28 – 2024-04-30 (×3): 1 via ORAL
  Filled 2024-04-28 (×3): qty 1

## 2024-04-28 MED ORDER — FOLIC ACID 1 MG PO TABS
1.0000 mg | ORAL_TABLET | Freq: Every day | ORAL | Status: DC
  Administered 2024-04-28 – 2024-04-30 (×3): 1 mg via ORAL
  Filled 2024-04-28 (×3): qty 1

## 2024-04-28 MED ORDER — THIAMINE MONONITRATE 100 MG PO TABS
100.0000 mg | ORAL_TABLET | Freq: Every day | ORAL | Status: DC
  Administered 2024-04-28 – 2024-04-30 (×3): 100 mg via ORAL
  Filled 2024-04-28 (×3): qty 1

## 2024-04-28 MED ORDER — IBUPROFEN 200 MG PO TABS
400.0000 mg | ORAL_TABLET | Freq: Four times a day (QID) | ORAL | Status: DC | PRN
  Administered 2024-04-29: 400 mg via ORAL
  Filled 2024-04-28: qty 2

## 2024-04-28 MED ORDER — ENOXAPARIN SODIUM 40 MG/0.4ML IJ SOSY
40.0000 mg | PREFILLED_SYRINGE | INTRAMUSCULAR | Status: DC
  Administered 2024-04-28 – 2024-04-30 (×3): 40 mg via SUBCUTANEOUS
  Filled 2024-04-28 (×3): qty 0.4

## 2024-04-28 MED ORDER — THIAMINE HCL 100 MG/ML IJ SOLN: 100.0000 mg | Freq: Every day | INTRAMUSCULAR | Status: DC

## 2024-04-28 MED ORDER — ONDANSETRON HCL 4 MG PO TABS: 4.0000 mg | ORAL_TABLET | Freq: Four times a day (QID) | ORAL | Status: DC | PRN

## 2024-04-28 MED ORDER — SODIUM CHLORIDE 0.9 % IV SOLN: INTRAVENOUS | Status: DC

## 2024-04-28 MED ORDER — LORAZEPAM 2 MG/ML IJ SOLN
0.0000 mg | INTRAMUSCULAR | Status: AC
  Administered 2024-04-28: 1 mg via INTRAVENOUS
  Filled 2024-04-28: qty 1

## 2024-04-28 MED ORDER — LEVETIRACETAM 500 MG PO TABS
500.0000 mg | ORAL_TABLET | Freq: Two times a day (BID) | ORAL | Status: DC
  Administered 2024-04-28 – 2024-04-30 (×4): 500 mg via ORAL
  Filled 2024-04-28 (×6): qty 1

## 2024-04-28 MED ORDER — LORAZEPAM 1 MG PO TABS: 1.0000 mg | ORAL_TABLET | ORAL | Status: DC | PRN

## 2024-04-28 MED ORDER — METOPROLOL TARTRATE 5 MG/5ML IV SOLN
5.0000 mg | Freq: Once | INTRAVENOUS | Status: AC
  Administered 2024-04-28: 5 mg via INTRAVENOUS
  Filled 2024-04-28: qty 5

## 2024-04-28 MED ORDER — ALUM & MAG HYDROXIDE-SIMETH 200-200-20 MG/5ML PO SUSP
30.0000 mL | Freq: Four times a day (QID) | ORAL | Status: DC | PRN
  Administered 2024-04-28 – 2024-04-29 (×2): 30 mL via ORAL
  Filled 2024-04-28 (×2): qty 30

## 2024-04-28 MED ORDER — LORAZEPAM 2 MG/ML IJ SOLN: 0.0000 mg | Freq: Three times a day (TID) | INTRAMUSCULAR | Status: DC

## 2024-04-28 MED ORDER — LEVETIRACETAM (KEPPRA) 500 MG/5 ML ADULT IV PUSH
1000.0000 mg | Freq: Once | INTRAVENOUS | Status: AC
  Administered 2024-04-28: 1000 mg via INTRAVENOUS
  Filled 2024-04-28: qty 10

## 2024-04-28 MED ORDER — PHENOBARBITAL SODIUM 65 MG/ML IJ SOLN
65.0000 mg | Freq: Once | INTRAMUSCULAR | Status: AC
  Administered 2024-04-28: 65 mg via INTRAVENOUS
  Filled 2024-04-28: qty 1

## 2024-04-28 MED ORDER — ALBUTEROL SULFATE (2.5 MG/3ML) 0.083% IN NEBU: 2.5000 mg | INHALATION_SOLUTION | RESPIRATORY_TRACT | Status: DC | PRN

## 2024-04-28 MED ORDER — LACTATED RINGERS IV BOLUS
1000.0000 mL | Freq: Once | INTRAVENOUS | Status: AC
  Administered 2024-04-28: 1000 mL via INTRAVENOUS

## 2024-04-28 MED ORDER — LORAZEPAM 2 MG/ML IJ SOLN: 1.0000 mg | INTRAMUSCULAR | Status: DC | PRN

## 2024-04-28 MED ORDER — THIAMINE HCL 100 MG/ML IJ SOLN
100.0000 mg | Freq: Once | INTRAMUSCULAR | Status: AC
  Administered 2024-04-28: 100 mg via INTRAVENOUS
  Filled 2024-04-28: qty 2

## 2024-04-28 MED ORDER — FOLIC ACID 1 MG PO TABS
1.0000 mg | ORAL_TABLET | Freq: Once | ORAL | Status: AC
  Administered 2024-04-28: 1 mg via ORAL
  Filled 2024-04-28: qty 1

## 2024-04-28 NOTE — ED Notes (Signed)
 Breakfast tray provided to patient.

## 2024-04-28 NOTE — Plan of Care (Signed)
" °  Problem: Clinical Measurements: Goal: Respiratory complications will improve Outcome: Progressing   Problem: Clinical Measurements: Goal: Cardiovascular complication will be avoided Outcome: Progressing   Problem: Elimination: Goal: Will not experience complications related to bowel motility Outcome: Progressing   Problem: Elimination: Goal: Will not experience complications related to urinary retention Outcome: Progressing   Problem: Pain Managment: Goal: General experience of comfort will improve and/or be controlled Outcome: Progressing   "

## 2024-04-28 NOTE — ED Provider Notes (Signed)
 " [place] EMERGENCY DEPARTMENT AT [facility] Provider Note  CSN: [id number] Arrival date & time: 04/28/24 9966  Chief Complaint(s) Seizures  HPI Edgar Olson is a 63 y.o. male who is here today due to reported seizures.  Patient has a history of seizure disorder, supposed to be taking Keppra .  He also has a history of alcohol  abuse, tells me he drinks 8 beers per day, did not drink anything today.  Patient tells me that over the last 6 months, he has been having multiple seizures.  He states he has not been taking his Keppra  because I was told to stop taking it.  He says that he was hospitalized over the last 1 month, however I do not see that listed in his chart.  He is unable to tell me where he was admitted.   Past Medical History Past Medical History:  Diagnosis Date   DVT (deep venous thrombosis) (HCC)    Hypertension    Patient Active Problem List   Diagnosis Date Noted   Seizure (HCC) 04/28/2024   PUD (peptic ulcer disease) 12/17/2022   Multiple gastric ulcers 12/17/2022   Multiple duodenal ulcers 12/17/2022   Normocytic anemia 12/14/2022   Hyponatremia 12/14/2022   Acute renal failure (ARF) 12/13/2022   Systolic murmur 11/27/2020   Skin cancer 11/27/2020   Pulmonary cavitary lesion 11/27/2020   Acute blood loss anemia 11/27/2020   Adrenal nodule 11/27/2020   Regional wall motion abnormality of heart 11/27/2020   Acute pulmonary embolism (HCC) 11/12/2020   Bilateral leg DVT (deep venous thrombosis) (HCC) 11/12/2020   Essential hypertension 11/12/2020   Home Medication(s) Prior to Admission medications  Medication Sig Start Date End Date Taking? Authorizing Provider  amoxicillin -clavulanate (AUGMENTIN ) 875-125 MG tablet Take 1 tablet by mouth every 12 (twelve) hours. 05/01/23   Jarold Olam HERO, PA-C  amoxicillin -clavulanate (AUGMENTIN ) 875-125 MG tablet Take 1 tablet by mouth every 12 (twelve) hours. 05/29/23   Armenta Canning, MD  bacitracin  ointment  Apply 1 Application topically 2 (two) times daily. 05/29/23   Armenta Canning, MD  pantoprazole  (PROTONIX ) 40 MG tablet Take 1 tablet (40 mg total) by mouth 2 (two) times daily before a meal. 12/18/22 01/17/23  Francella Rogue, MD                                                                                                                                    Past Surgical History Past Surgical History:  Procedure Laterality Date   APPLICATION OF ANGIOVAC Bilateral 11/15/2020   Procedure: APPLICATION OF ANGIOVAC;  Surgeon: Serene Gaile ORN, MD;  Location: [facility] OR;  Service: Vascular;  Laterality: Bilateral;   BIOPSY  12/16/2022   Procedure: BIOPSY;  Surgeon: Avram Lupita BRAVO, MD;  Location: [facility] ENDOSCOPY;  Service: Gastroenterology;;   ESOPHAGOGASTRODUODENOSCOPY (EGD) WITH PROPOFOL  N/A 12/16/2022   Procedure: ESOPHAGOGASTRODUODENOSCOPY (EGD) WITH PROPOFOL ;  Surgeon: Avram Lupita BRAVO, MD;  Location: [facility] ENDOSCOPY;  Service: Gastroenterology;  Laterality: N/A;   IVC VENOGRAPHY N/A 11/14/2020   Procedure: IVC Venography;  Surgeon: Gretta Lonni PARAS, MD;  Location: [facility] INVASIVE CV LAB;  Service: Cardiovascular;  Laterality: N/A;   MECHANICAL THROMBECTOMY WITH AORTOGRAM AND INTERVENTION Bilateral 11/15/2020   Procedure: MECHANICAL THROMBECTOMY;  Surgeon: Serene Gaile ORN, MD;  Location: [facility] OR;  Service: Vascular;  Laterality: Bilateral;   PERIPHERAL VASCULAR THROMBECTOMY Bilateral 11/14/2020   Procedure: PERIPHERAL VASCULAR THROMBECTOMY;  Surgeon: Gretta Lonni PARAS, MD;  Location: [facility] INVASIVE CV LAB;  Service: Cardiovascular;  Laterality: Bilateral;  With TPA Infusion    ULTRASOUND GUIDANCE FOR VASCULAR ACCESS Bilateral 11/15/2020   Procedure: ULTRASOUND GUIDANCE FOR VASCULAR ACCESS;  Surgeon: Serene Gaile ORN, MD;  Location: [facility] OR;  Service: Vascular;  Laterality: Bilateral;   VENA CAVA FILTER PLACEMENT Right 11/15/2020   Procedure: INSERTION VENA-CAVA FILTER;  Surgeon: Serene Gaile ORN, MD;  Location: [facility] OR;  Service:  Vascular;  Laterality: Right;   VENOGRAM Right 11/15/2020   Procedure: VENOGRAM;  Surgeon: Serene Gaile ORN, MD;  Location: [facility] OR;  Service: Vascular;  Laterality: Right;   VENOPLASTY Right 11/15/2020   Procedure: VENOPLASTY;  Surgeon: Serene Gaile ORN, MD;  Location: [facility] OR;  Service: Vascular;  Laterality: Right;   Family History History reviewed. No pertinent family history.  Social History Social History[1] Allergies Patient has no known allergies.  Review of Systems Review of Systems  Physical Exam Vital Signs  I have reviewed the triage vital signs BP (!) 156/99   Pulse 74   Temp 97.6 F (36.4 C) (Oral)   Resp 18   SpO2 98%   Physical Exam Vitals and nursing note reviewed.  HENT:     Head: Normocephalic and atraumatic.     Mouth/Throat:     Mouth: Mucous membranes are moist.  Eyes:     Pupils: Pupils are equal, round, and reactive to light.  Cardiovascular:     Rate and Rhythm: Normal rate.  Pulmonary:     Effort: Pulmonary effort is normal.  Abdominal:     General: Abdomen is flat.  Musculoskeletal:        General: Normal range of motion.  Skin:    General: Skin is warm.  Neurological:     General: No focal deficit present.     Mental Status: He is alert.     Cranial Nerves: No cranial nerve deficit.     Motor: No weakness.     Gait: Gait normal.     ED Results and Treatments Labs (all labs ordered are listed, but only abnormal results are displayed) Labs Reviewed  COMPREHENSIVE METABOLIC PANEL WITH GFR - Abnormal; Notable for the following components:      Result Value   Chloride 94 (*)    Glucose, Bld 123 (*)    Creatinine, Ser 1.27 (*)    Total Protein 8.3 (*)    Albumin 5.1 (*)    AST 202 (*)    ALT 62 (*)    Anion gap 20 (*)    All other components within normal limits  CBC WITH DIFFERENTIAL/PLATELET - Abnormal; Notable for the following components:   WBC 13.7 (*)    RDW 16.0 (*)    Neutro Abs 11.8 (*)    All other components within  normal limits  ETHANOL  URINE DRUG SCREEN  Radiology CT Head Wo Contrast Result Date: 04/28/2024 EXAM: CT HEAD WITHOUT CONTRAST 04/28/2024 04:47:16 AM TECHNIQUE: CT of the head was performed without the administration of intravenous contrast. Automated exposure control, iterative reconstruction, and/or weight based adjustment of the mA/kV was utilized to reduce the radiation dose to as low as reasonably achievable. COMPARISON: CT of the head dated 08/26/2021. CLINICAL HISTORY: Delirium. FINDINGS: BRAIN AND VENTRICLES: No acute hemorrhage. No evidence of acute infarct. No hydrocephalus. No extra-axial collection. No mass effect or midline shift. ORBITS: No acute abnormality. SINUSES: No acute abnormality. SOFT TISSUES AND SKULL: No acute soft tissue abnormality. No skull fracture. IMPRESSION: 1. No acute intracranial abnormality. Electronically signed by: Evalene Coho MD 04/28/2024 04:56 AM EST RP Workstation: [id number]    Pertinent labs & imaging results that were available during my care of the patient were reviewed by me and considered in my medical decision making (see MDM for details).  Medications Ordered in ED Medications  0.9 %  sodium chloride  infusion ( Intravenous New Bag/Given 04/28/24 0559)  LORazepam  (ATIVAN ) tablet 1-4 mg (has no administration in time range)    Or  LORazepam  (ATIVAN ) injection 1-4 mg (has no administration in time range)  LORazepam  (ATIVAN ) injection 0-4 mg ( Intravenous Not Given 04/28/24 0601)    Followed by  LORazepam  (ATIVAN ) injection 0-4 mg (has no administration in time range)  levETIRAcetam  (KEPPRA ) undiluted injection 1,000 mg (1,000 mg Intravenous Given 04/28/24 0122)  PHENObarbital  (LUMINAL) injection 65 mg (65 mg Intravenous Given 04/28/24 0123)  lactated ringers  bolus 1,000 mL (0 mLs Intravenous Stopped 04/28/24 0601)   thiamine  (VITAMIN B1) injection 100 mg (100 mg Intravenous Given 04/28/24 0123)  folic acid  (FOLVITE ) tablet 1 mg (1 mg Oral Given 04/28/24 0123)                                                                                                                                     Procedures Procedures  (including critical care time)  Medical Decision Making / ED Course   This patient presents to the ED for concern of seizure, this involves an extensive number of treatment options, and is a complaint that carries with it a high risk of complications and morbidity.  The differential diagnosis includes epilepsy, alcohol  withdrawal.  MDM: Patient is a bit of a difficult historian.  Unclear if this is secondary to recent seizure, or possibly some Wernicke Korsakoff.  Patient without any alcohol  today, and with his history of heavy alcohol  abuse I do have concern for alcohol  withdrawal seizures.  Does not appear to be in acute withdrawal at this time.  Will provide him with some phenobarbital .  Patient also does have a history of seizure, has not been compliant with his medications.  Will provide him with some Keppra .  Basic blood work ordered.  Given the patient's alcohol  abuse, will obtain imaging of the patient's head in case there is a recent fall  he does not recall.  Will provide the patient was on folic acid  and thiamine .  Reassessment 5:20 AM-patient's CT head negative per my independent review.  No significant electrolyte abnormalities, renal function is at baseline.  I continue have concerned about the patient possibly having an alcohol  withdrawal seizure.  He responded well to phenobarbital .  Will admit patient to hospitalist service.   Additional history obtained:  -External records from outside source obtained and reviewed including: Chart review including previous notes, labs, imaging, consultation notes   Lab Tests: -I ordered, reviewed, and interpreted labs.   The pertinent results  include:   Labs Reviewed  COMPREHENSIVE METABOLIC PANEL WITH GFR - Abnormal; Notable for the following components:      Result Value   Chloride 94 (*)    Glucose, Bld 123 (*)    Creatinine, Ser 1.27 (*)    Total Protein 8.3 (*)    Albumin 5.1 (*)    AST 202 (*)    ALT 62 (*)    Anion gap 20 (*)    All other components within normal limits  CBC WITH DIFFERENTIAL/PLATELET - Abnormal; Notable for the following components:   WBC 13.7 (*)    RDW 16.0 (*)    Neutro Abs 11.8 (*)    All other components within normal limits  ETHANOL  URINE DRUG SCREEN          Imaging Studies ordered: I ordered imaging studies including CT head I independently visualized and interpreted imaging. I agree with the radiologist interpretation   Medicines ordered and prescription drug management: Meds ordered this encounter  Medications   levETIRAcetam  (KEPPRA ) undiluted injection 1,000 mg   PHENObarbital  (LUMINAL) injection 65 mg   lactated ringers  bolus 1,000 mL   thiamine  (VITAMIN B1) injection 100 mg   folic acid  (FOLVITE ) tablet 1 mg   0.9 %  sodium chloride  infusion   OR Linked Order Group    LORazepam  (ATIVAN ) tablet 1-4 mg     CIWA-AR < 5 =:   0 mg     CIWA-AR 5 -10 =:   1 mg     CIWA-AR 11 -15 =:   2 mg     CIWA-AR 16 -20 =:   3 mg     CIWA-AR 16 -20 =:   Recheck CIWA-AR in 1 hour; if > 20 notify MD     CIWA-AR > 20 =:   4 mg     CIWA-AR > 20 =:   Call Rapid Response    LORazepam  (ATIVAN ) injection 1-4 mg     CIWA-AR < 5 =:   0 mg     CIWA-AR 5 -10 =:   1 mg     CIWA-AR 11 -15 =:   2 mg     CIWA-AR 16 -20 =:   3 mg     CIWA-AR 16 -20 =:   Recheck CIWA-AR in 1 hour; if > 20 notify MD     CIWA-AR > 20 =:   4 mg     CIWA-AR > 20 =:   Call Rapid Response   FOLLOWED BY Linked Order Group    LORazepam  (ATIVAN ) injection 0-4 mg     CIWA-AR < 5 =:   0 mg     CIWA-AR 5 -10 =:   1 mg     CIWA-AR 11 -15 =:   2 mg     CIWA-AR 16 -20 =:   3 mg     CIWA-AR 16 -  20 =:   Recheck CIWA-AR in  1 hour; if > 20 notify MD     CIWA-AR > 20 =:   4 mg     CIWA-AR > 20 =:   Call Rapid Response    LORazepam  (ATIVAN ) injection 0-4 mg     CIWA-AR < 5 =:   0 mg     CIWA-AR 5 -10 =:   1 mg     CIWA-AR 11 -15 =:   2 mg     CIWA-AR 16 -20 =:   3 mg     CIWA-AR 16 -20 =:   Recheck CIWA-AR in 1 hour; if > 20 notify MD     CIWA-AR > 20 =:   4 mg     CIWA-AR > 20 =:   Call Rapid Response    -I have reviewed the patients home medicines and have made adjustments as needed   Cardiac Monitoring: The patient was maintained on a cardiac monitor.  I personally viewed and interpreted the cardiac monitored which showed an underlying rhythm of: Sinus rhythm  Social Determinants of Health:  Factors impacting patients care include: Lack of access to primary care, history of alcohol  abuse   Reevaluation: After the interventions noted above, I reevaluated the patient and found that they have :improved  Co morbidities that complicate the patient evaluation  Past Medical History:  Diagnosis Date   DVT (deep venous thrombosis) (HCC)    Hypertension       Final Clinical Impression(s) / ED Diagnoses Final diagnoses:  Seizure-like activity (HCC)  Alcohol  abuse     @PCDICTATION @     [1]  Social History Tobacco Use   Smoking status: Former    Current packs/day: 0.00    Types: Cigarettes    Quit date: 2018    Years since quitting: 7.9   Smokeless tobacco: Never  Vaping Use   Vaping status: Never Used  Substance Use Topics   Alcohol  use: Yes    Alcohol /week: 42.0 standard drinks of alcohol     Types: 42 Cans of beer per week    Comment: 6 beers a night   Drug use: Yes    Frequency: 1.0 times per week    Types: Marijuana     Mannie Pac T, DO 04/28/24 [phone]  "

## 2024-04-28 NOTE — Progress Notes (Signed)
 Stat EKG taken due to telemetry monitor afib 150s-160s. Vitals taken and recorded. Lavanda Horns NP notified. Patient alert and aware. Says he could feel his heart beat and then denies feeling his heart beat. Indicates pain to left rib area.   NP here to see patient. IV metoprolol  ordered with a 500ml fluid bolus. Will continue to observe. After IV metoprolol  was given HR went from 130s down to 90s-70s normal sinus. B/p still elevated at this time.   Scored 7 with CIWA, protocol followed with ativan  given per MAR.
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
## 2024-04-28 NOTE — Progress Notes (Signed)
" ° ° ° °  Patient Name: Edgar Olson           DOB: [birth year]  MRN: [id number]      Admission Date: 04/28/2024  Attending Provider: Zella Katha HERO, MD  Primary Diagnosis: Seizure [facility])   Level of care: Progressive   OVERNIGHT EVENT  HPI/ Events of Note Edgar Olson, 63 y.o. male, was admitted on 04/28/2024 for Seizure [facility]).  Notified by bedside RN of patient having a change in cardiac rhythm and sustaining HR 130-160's.  EKG being obtained.  Transient Atrial Fibrillation with RVR; HR 130-160's Hemodynamically stable.  Denies dizziness, lightheadedness, chest pain, shortness of breath, nausea, vomiting, abdominal discomfort.  Endorses some palpitations. BP stable, will trial metoprolol for rate control.    Bedside Assessment:  Patient is awake, A/O, with no associated distress.   Respiratory: Bilaterally clear, no wheezing, no crackles. Normal effort. No accessory muscle use.  Cardiovascular: Irregularly irregular rate. Tachycardia.    Plan: EKG --> afib rvr Cardiac Telemetry Metoprolol Fluid bolus, 500 cc Labs --> CMP, Mag, phosp, TSH,    Addendum: HR 80s, rate controlled.  Converted back to NSR.   Edgar Gladwin, DNP, ACNPC- AG [facility]    "

## 2024-04-29 ENCOUNTER — Observation Stay (HOSPITAL_COMMUNITY)

## 2024-04-29 DIAGNOSIS — R569 Unspecified convulsions: Principal | ICD-10-CM

## 2024-04-29 DIAGNOSIS — R55 Syncope and collapse: Secondary | ICD-10-CM

## 2024-04-29 LAB — ECHOCARDIOGRAM COMPLETE
AR max vel: 1.41 cm2
AV Area VTI: 1.5 cm2
AV Area mean vel: 1.47 cm2
AV Mean grad: 12 mmHg
AV Peak grad: 22.3 mmHg
Ao pk vel: 2.36 m/s
Area-P 1/2: 2.97 cm2
Calc EF: 60.6 %
Height: 74 in
P 1/2 time: 559 ms
S' Lateral: 3.4 cm
Single Plane A2C EF: 62.7 %
Single Plane A4C EF: 55.2 %
Weight: 3281.6 [oz_av]

## 2024-04-29 LAB — URINE DRUG SCREEN
Amphetamines: NEGATIVE
Barbiturates: POSITIVE — AB
Benzodiazepines: NEGATIVE
Cocaine: NEGATIVE
Fentanyl: NEGATIVE
Methadone Scn, Ur: NEGATIVE
Opiates: NEGATIVE
Tetrahydrocannabinol: POSITIVE — AB

## 2024-04-29 LAB — CBC
HCT: 46 % (ref 39.0–52.0)
Hemoglobin: 15.2 g/dL (ref 13.0–17.0)
MCH: 28.5 pg (ref 26.0–34.0)
MCHC: 33 g/dL (ref 30.0–36.0)
MCV: 86.3 fL (ref 80.0–100.0)
Platelets: 202 K/uL (ref 150–400)
RBC: 5.33 MIL/uL (ref 4.22–5.81)
RDW: 15.9 % — ABNORMAL HIGH (ref 11.5–15.5)
WBC: 12 K/uL — ABNORMAL HIGH (ref 4.0–10.5)
nRBC: 0 % (ref 0.0–0.2)

## 2024-04-29 LAB — HIV ANTIBODY (ROUTINE TESTING W REFLEX): HIV Screen 4th Generation wRfx: NONREACTIVE

## 2024-04-29 LAB — COMPREHENSIVE METABOLIC PANEL WITH GFR
ALT: 62 U/L — ABNORMAL HIGH (ref 0–44)
AST: 171 U/L — ABNORMAL HIGH (ref 15–41)
Albumin: 4.6 g/dL (ref 3.5–5.0)
Alkaline Phosphatase: 52 U/L (ref 38–126)
Anion gap: 15 (ref 5–15)
BUN: 16 mg/dL (ref 8–23)
CO2: 24 mmol/L (ref 22–32)
Calcium: 9.3 mg/dL (ref 8.9–10.3)
Chloride: 99 mmol/L (ref 98–111)
Creatinine, Ser: 1.01 mg/dL (ref 0.61–1.24)
GFR, Estimated: >60 mL/min
Glucose, Bld: 104 mg/dL — ABNORMAL HIGH (ref 70–99)
Potassium: 4 mmol/L (ref 3.5–5.1)
Sodium: 138 mmol/L (ref 135–145)
Total Bilirubin: 0.7 mg/dL (ref 0.0–1.2)
Total Protein: 7.6 g/dL (ref 6.5–8.1)

## 2024-04-29 LAB — PHOSPHORUS: Phosphorus: 2.3 mg/dL — ABNORMAL LOW (ref 2.5–4.6)

## 2024-04-29 LAB — MAGNESIUM: Magnesium: 2.6 mg/dL — ABNORMAL HIGH (ref 1.7–2.4)

## 2024-04-29 LAB — TSH: TSH: 2.06 u[IU]/mL (ref 0.350–4.500)

## 2024-04-29 MED ORDER — POTASSIUM & SODIUM PHOSPHATES 280-160-250 MG PO PACK
1.0000 | PACK | Freq: Two times a day (BID) | ORAL | Status: DC
  Administered 2024-04-29 – 2024-04-30 (×2): 1 via ORAL
  Filled 2024-04-29 (×4): qty 1

## 2024-04-29 NOTE — Progress Notes (Signed)
 " [facility] Progress Note  Patient: Edgar Olson     FMW:[id number]  DOA: 04/28/2024   PCP: Pcp, No       Brief hospital course: This is a 63 year old male with epilepsy, hypertension, history of DVT PE, alcohol  abuse who presents to the hospital for seizure-like activity.  He admits to not taking his antiepileptic medication. In the ED: Noted to have mildly elevated AST, ALT and a WBC count of 13.7. Urine drug screen positive for barbiturates and THC.  Subjective:  No complaints today.   Assessment and Plan: Principal Problem:   Seizure vs syncope - ordered MRI brain, EEG and ECHO - MRI does not reveal any abnormalities - previously was told by neurology to remain on Keppra  life long but he states someone told him to stop it - it has been resumed  Mild hypophosphatemia - replace and follow  Leukocytosis - no signs of infection - follow   ETOH use Elevated LFTs - drinks 7 beers daily- have discussed cessation, risk factors from chronic alcohol  abuse and the fact that it can decrease the seizure threshold - have discussed the risk for fatty liver/ cirrhosis - f/u LFTs  Marijuana use  - counseled on cessation and risk factors from using it - although barbiturates were also found on his UDS, he does not admit to taking any drugs other than marijauna      Code Status: Full Code Total time on patient care: 35 min DVT prophylaxis:  enoxaparin  (LOVENOX ) injection 40 mg Start: 04/28/24 1000     Objective:   Vitals:   04/28/24 2244 04/29/24 0149 04/29/24 0150 04/29/24 0228  BP: (!) 155/95 (!) 148/101 (!) 148/101 116/74  Pulse: 60 79 79 72  Resp: 18   20  Temp:    98.5 F (36.9 C)  TempSrc:    Oral  SpO2: 100%  100% 98%  Weight:      Height:       Filed Weights   04/28/24 1430  Weight: 93 kg   Exam: General exam: Appears comfortable  HEENT: oral mucosa moist Respiratory system: Clear to auscultation.  Cardiovascular system: S1 & S2 heard   Gastrointestinal system: Abdomen soft, non-tender, nondistended. Normal bowel sounds   Extremities: No cyanosis, clubbing or edema Psychiatry:  Mood & affect appropriate.   CBC: Recent Labs  Lab 04/28/24 0144 04/29/24 0141  WBC 13.7* 12.0*  NEUTROABS 11.8*  --   HGB 16.1 15.2  HCT 48.5 46.0  MCV 86.1 86.3  PLT 222 202   Basic Metabolic Panel: Recent Labs  Lab 04/28/24 0144 04/29/24 0141  NA 136 138  K 4.2 4.0  CL 94* 99  CO2 22 24  GLUCOSE 123* 104*  BUN 14 16  CREATININE 1.27* 1.01  CALCIUM 10.0 9.3  MG  --  2.6*  PHOS  --  2.3*     Scheduled Meds:  enoxaparin  (LOVENOX ) injection  40 mg Subcutaneous Q24H   folic acid   1 mg Oral Daily   levETIRAcetam   500 mg Oral BID   LORazepam   0-4 mg Intravenous Q4H   Followed by   NOREEN ON 04/30/2024] LORazepam   0-4 mg Intravenous Q8H   multivitamin with minerals  1 tablet Oral Daily   thiamine   100 mg Oral Daily   Or   thiamine   100 mg Intravenous Daily    Imaging and lab data personally reviewed   Author: Lamiya Naas  04/29/2024 8:36 AM  To contact [facility]>   Check  the care team in [facility] and look for the attending/consulting [facility] provider listed  Log into [email or web address] and use [facility]'s universal password   Go to> [facility]  and find provider  If you still have difficulty reaching the provider, please page the [facility] (Director on Call) for the Hospitalists listed on amion     "

## 2024-04-29 NOTE — Plan of Care (Signed)

## 2024-04-29 NOTE — TOC Initial Note (Signed)
 Transition of Care [facility]) - Initial/Assessment Note    Patient Details  Name: Edgar Olson MRN: [id number] Date of Birth: 07-24-1960  Transition of Care [facility]) CM/SW Contact:    Sonda Manuella Quill, RN Phone Number: 04/29/2024, 1:58 PM  Clinical Narrative:                 IP CM consult for SA counseling/education; no PCP listed; spoke w/ pt in room; pt said he lives at home; he plans to return w/ family support at d/c; his dtr will provide transportation; he identified POC Ruth Tully, [place] [phone]); insurance verified; pt confirmed he does not have PCP; pt said he has difficulty paying for food, housing, and utilities; he does not have DME, HH services, or home oxygen; pt agreed to receive resources for [facility], [employer], [place], and [facility] PCPs; pt also given list of [facility] dermatologists per his request; pt verbalized understanding he will make his own appt w/ agencies/providers of choice; resources placed in d/c instructions; copy of resources also given to pt.  Expected Discharge Plan: Home/Self Care Barriers to Discharge: Continued Medical Work up   Patient Goals and CMS Choice Patient states their goals for this hospitalization and ongoing recovery are:: home     [place] ownership interest in [facility].provided to:: Patient    Expected Discharge Plan and Services   Discharge Planning Services: CM Consult   Living arrangements for the past 2 months: Apartment                 DME Arranged: N/A DME Agency: NA       HH Arranged: NA HH Agency: NA        Prior Living Arrangements/Services Living arrangements for the past 2 months: Apartment Lives with:: Adult Children Patient language and need for interpreter reviewed:: Yes Do you feel safe going back to the place where you live?: Yes      Need for Family Participation in Patient Care: Yes (Comment) Care giver support system in place?: Yes (comment)   Criminal Activity/Legal  Involvement Pertinent to Current Situation/Hospitalization: No - Comment as needed  Activities of Daily Living   ADL Screening (condition at time of admission) Independently performs ADLs?: Yes (appropriate for developmental age) Is the patient deaf or have difficulty hearing?: No Does the patient have difficulty seeing, even when wearing glasses/contacts?: No Does the patient have difficulty concentrating, remembering, or making decisions?: Yes  Permission Sought/Granted Permission sought to share information with : Case Manager Permission granted to share information with : Yes, Verbal Permission Granted  Share Information with NAME: Case Manager     Permission granted to share info w Relationship: Jairon, Ripberger (son) [phone]     Emotional Assessment Appearance:: Appears stated age Attitude/Demeanor/Rapport: Gracious Affect (typically observed): Accepting Orientation: : Oriented to Self, Oriented to Place, Oriented to  Time, Oriented to Situation Alcohol  / Substance Use: Alcohol  Use Psych Involvement: No (comment)  Admission diagnosis:  Alcohol  abuse [F10.10] Seizure (HCC) [R56.9] Seizure-like activity (HCC) [R56.9] Patient Active Problem List   Diagnosis Date Noted   Seizure (HCC) 04/28/2024   PUD (peptic ulcer disease) 12/17/2022   Multiple gastric ulcers 12/17/2022   Multiple duodenal ulcers 12/17/2022   Normocytic anemia 12/14/2022   Hyponatremia 12/14/2022   Acute renal failure (ARF) 12/13/2022   Systolic murmur 11/27/2020   Skin cancer 11/27/2020   Pulmonary cavitary lesion 11/27/2020   Acute blood loss anemia 11/27/2020   Adrenal nodule 11/27/2020   Regional wall  motion abnormality of heart 11/27/2020   Acute pulmonary embolism (HCC) 11/12/2020   Bilateral leg DVT (deep venous thrombosis) (HCC) 11/12/2020   Essential hypertension 11/12/2020   PCP:  Pcp, No Pharmacy:   [facility] [address] [place], [place] - [address] AT  [address] [place]. [place] [address] Phone: [phone] Fax: [phone]     Social Drivers of Health (SDOH) Social History: SDOH Screenings   Food Insecurity: Food Insecurity Present (04/29/2024)  Housing: High Risk (04/29/2024)  Transportation Needs: No Transportation Needs (04/29/2024)  Utilities: At Risk (04/29/2024)  Tobacco Use: Medium Risk (04/28/2024)   SDOH Interventions: Food Insecurity Interventions: [employer] Provided, Inpatient TOC Housing Interventions: [employer] Provided, Inpatient TOC Transportation Interventions: Intervention Not Indicated, Inpatient TOC Utilities Interventions: Community Resources Provided, Inpatient TOC   Readmission Risk Interventions    12/14/2022    4:39 PM  Readmission Risk Prevention Plan  Post Dischage Appt Complete  Medication Screening Complete  Transportation Screening Complete

## 2024-04-29 NOTE — Plan of Care (Signed)
" °  Problem: Clinical Measurements: Goal: Respiratory complications will improve Outcome: Progressing   Problem: Clinical Measurements: Goal: Cardiovascular complication will be avoided Outcome: Progressing   Problem: Clinical Measurements: Goal: Will remain free from infection Outcome: Progressing   Problem: Pain Managment: Goal: General experience of comfort will improve and/or be controlled Outcome: Progressing   Problem: Safety: Goal: Ability to remain free from injury will improve Outcome: Progressing   "

## 2024-04-30 ENCOUNTER — Other Ambulatory Visit (HOSPITAL_COMMUNITY): Payer: Self-pay

## 2024-04-30 DIAGNOSIS — R569 Unspecified convulsions: Secondary | ICD-10-CM | POA: Diagnosis not present

## 2024-04-30 LAB — COMPREHENSIVE METABOLIC PANEL WITH GFR
ALT: 46 U/L — ABNORMAL HIGH (ref 0–44)
AST: 108 U/L — ABNORMAL HIGH (ref 15–41)
Albumin: 3.8 g/dL (ref 3.5–5.0)
Alkaline Phosphatase: 42 U/L (ref 38–126)
Anion gap: 9 (ref 5–15)
BUN: 23 mg/dL (ref 8–23)
CO2: 26 mmol/L (ref 22–32)
Calcium: 8.9 mg/dL (ref 8.9–10.3)
Chloride: 104 mmol/L (ref 98–111)
Creatinine, Ser: 0.86 mg/dL (ref 0.61–1.24)
GFR, Estimated: >60 mL/min
Glucose, Bld: 99 mg/dL (ref 70–99)
Potassium: 4.5 mmol/L (ref 3.5–5.1)
Sodium: 138 mmol/L (ref 135–145)
Total Bilirubin: 0.6 mg/dL (ref 0.0–1.2)
Total Protein: 6.4 g/dL — ABNORMAL LOW (ref 6.5–8.1)

## 2024-04-30 LAB — CBC
HCT: 38.5 % — ABNORMAL LOW (ref 39.0–52.0)
Hemoglobin: 12.9 g/dL — ABNORMAL LOW (ref 13.0–17.0)
MCH: 28.9 pg (ref 26.0–34.0)
MCHC: 33.5 g/dL (ref 30.0–36.0)
MCV: 86.3 fL (ref 80.0–100.0)
Platelets: 171 K/uL (ref 150–400)
RBC: 4.46 MIL/uL (ref 4.22–5.81)
RDW: 15.6 % — ABNORMAL HIGH (ref 11.5–15.5)
WBC: 6.7 K/uL (ref 4.0–10.5)
nRBC: 0 % (ref 0.0–0.2)

## 2024-04-30 LAB — PHOSPHORUS: Phosphorus: 2.6 mg/dL (ref 2.5–4.6)

## 2024-04-30 MED ORDER — FOLIC ACID 1 MG PO TABS
1.0000 mg | ORAL_TABLET | Freq: Every day | ORAL | 0 refills | Status: AC
  Filled 2024-04-30: qty 30, 30d supply, fill #0

## 2024-04-30 MED ORDER — VITAMIN B-1 100 MG PO TABS
100.0000 mg | ORAL_TABLET | Freq: Every day | ORAL | 0 refills | Status: AC
  Filled 2024-04-30: qty 30, 30d supply, fill #0

## 2024-04-30 MED ORDER — LEVETIRACETAM 500 MG PO TABS
500.0000 mg | ORAL_TABLET | Freq: Two times a day (BID) | ORAL | 1 refills | Status: AC
  Filled 2024-04-30: qty 60, 30d supply, fill #0

## 2024-04-30 NOTE — TOC Transition Note (Signed)
 Transition of Care [facility]) - Discharge Note   Patient Details  Name: Edgar Olson MRN: [id number] Date of Birth: 1961/05/01  Transition of Care [facility]) CM/SW Contact:  Sonda Manuella Quill, RN Phone Number: 04/30/2024, 2:34 PM   Clinical Narrative:    D/C orders received; no IP CM needs.   Final next level of care: Home/Self Care Barriers to Discharge: No Barriers Identified   Patient Goals and CMS Choice Patient states their goals for this hospitalization and ongoing recovery are:: home      ownership interest in [facility].provided to:: Patient    Discharge Placement                       Discharge Plan and Services Additional resources added to the After Visit Summary for     Discharge Planning Services: CM Consult            DME Arranged: N/A DME Agency: NA       HH Arranged: NA HH Agency: NA        Social Drivers of Health (SDOH) Interventions SDOH Screenings   Food Insecurity: Food Insecurity Present (04/29/2024)  Housing: High Risk (04/29/2024)  Transportation Needs: No Transportation Needs (04/29/2024)  Utilities: At Risk (04/29/2024)  Tobacco Use: Medium Risk (04/28/2024)     Readmission Risk Interventions    12/14/2022    4:39 PM  Readmission Risk Prevention Plan  Post Dischage Appt Complete  Medication Screening Complete  Transportation Screening Complete

## 2024-04-30 NOTE — Discharge Summary (Signed)
 Physician Discharge Summary  Edgar Olson FMW:[id number] DOB: [birth year] DOA: 04/28/2024  PCP: Pcp, No  Admit date: 04/28/2024 Discharge date: 04/30/2024 Discharging to: Home Recommendations for Outpatient Follow-up:  We have discussed that he will need to reestablish with a PCP and also follow-up with neurology      Discharge Diagnoses:   Principal Problem:   Seizure vs syncope    Brief hospital course: This is a 63 year old male with epilepsy, hypertension, history of DVT PE, alcohol  abuse who presents to the hospital for seizure-like activity.  He admits to not taking his antiepileptic medication. In the ED: Noted to have mildly elevated AST, ALT and a WBC count of 13.7. Urine drug screen positive for barbiturates and THC.   Subjective:  No complaints today.    Assessment and Plan: Principal Problem:   Seizure vs syncope - ordered MRI brain, EEG and ECHO - MRI does not reveal any abnormalities -2D echo reveals mild aortic stenosis - previously was told by neurology to remain on Keppra  life long but he states someone told him to stop it - it has been resumed - I have recommended that he follow-up with neurology as outpatient and that he stopped driving   Mild hypophosphatemia - replaced a    Leukocytosis - no signs of infection -    ETOH use Elevated LFTs - drinks 7 beers daily- have discussed cessation, risk factors from chronic alcohol  abuse and the fact that it can decrease the seizure threshold - have discussed the risk for fatty liver/ cirrhosis     Marijuana use  - counseled on cessation and risk factors from using it - although barbiturates were also found on his UDS, he does not admit to taking any drugs other than marijauna        Discharge Instructions   Allergies as of 04/30/2024   No Known Allergies      Medication List     STOP taking these medications    amoxicillin -clavulanate 875-125 MG tablet Commonly known as: AUGMENTIN     bacitracin  ointment   pantoprazole  40 MG tablet Commonly known as: PROTONIX        TAKE these medications    folic acid  1 MG tablet Commonly known as: FOLVITE  Take 1 tablet (1 mg total) by mouth daily.   levETIRAcetam  500 MG tablet Commonly known as: KEPPRA  Take 1 tablet (500 mg total) by mouth 2 (two) times daily.   thiamine  100 MG tablet Commonly known as: Vitamin B-1 Take 1 tablet (100 mg total) by mouth daily.            The results of significant diagnostics from this hospitalization (including imaging, microbiology, ancillary and laboratory) are listed below for reference.    ECHOCARDIOGRAM COMPLETE Result Date: 04/29/2024    ECHOCARDIOGRAM REPORT   Patient Name:   Edgar Olson Date of Exam: 04/29/2024 Medical Rec #:  [id number]       Height:       74.0 in Accession #:    [id number]      Weight:       205.1 lb Date of Birth:  Jul 20, 1960       BSA:          2.197 m Patient Age:    63 years        BP:           122/74 mmHg Patient Gender: M               HR:  69 bpm. Exam Location:  Inpatient Procedure: 2D Echo, Cardiac Doppler and Color Doppler (Both Spectral and Color            Flow Doppler were utilized during procedure). Indications:    Syncope R55  History:        Patient has prior history of Echocardiogram examinations, most                 recent 11/13/2020. Signs/Symptoms:Murmur.  Sonographer:    Nathanel Devonshire Referring Phys: [id number] Verge IMPRESSIONS  1. Left ventricular ejection fraction, by estimation, is 60 to 65%. Left ventricular ejection fraction by 2D MOD biplane is 60.6 %. The left ventricle has normal function. The left ventricle has no regional wall motion abnormalities. Left ventricular diastolic function could not be evaluated.  2. Right ventricular systolic function is normal. The right ventricular size is normal.  3. The mitral valve is normal in structure. Mild mitral valve regurgitation. No evidence of mitral stenosis.  4. The aortic valve is  calcified. Aortic valve regurgitation is mild. Mild aortic valve stenosis. Aortic valve area, by VTI measures 1.50 cm. Aortic valve mean gradient measures 12.0 mmHg. Aortic valve Vmax measures 2.36 m/s.  5. The inferior vena cava is normal in size with greater than 50% respiratory variability, suggesting right atrial pressure of 3 mmHg. Comparison(s): Changes from prior study are noted. EF has improved. FINDINGS  Left Ventricle: Left ventricular ejection fraction, by estimation, is 60 to 65%. Left ventricular ejection fraction by 2D MOD biplane is 60.6 %. The left ventricle has normal function. The left ventricle has no regional wall motion abnormalities. The left ventricular internal cavity size was normal in size. There is no left ventricular hypertrophy. Left ventricular diastolic function could not be evaluated. Right Ventricle: The right ventricular size is normal. No increase in right ventricular wall thickness. Right ventricular systolic function is normal. Left Atrium: Left atrial size was normal in size. Right Atrium: Right atrial size was normal in size. Pericardium: There is no evidence of pericardial effusion. Mitral Valve: The mitral valve is normal in structure. Mild mitral valve regurgitation. No evidence of mitral valve stenosis. Tricuspid Valve: The tricuspid valve is normal in structure. Tricuspid valve regurgitation is not demonstrated. No evidence of tricuspid stenosis. Aortic Valve: The aortic valve is calcified. Aortic valve regurgitation is mild. Aortic regurgitation PHT measures 559 msec. Mild aortic stenosis is present. Aortic valve mean gradient measures 12.0 mmHg. Aortic valve peak gradient measures 22.3 mmHg. Aortic valve area, by VTI measures 1.50 cm. Pulmonic Valve: The pulmonic valve was not well visualized. Pulmonic valve regurgitation is not visualized. Aorta: The aortic root and ascending aorta are structurally normal, with no evidence of dilitation. Venous: The inferior vena cava  is normal in size with greater than 50% respiratory variability, suggesting right atrial pressure of 3 mmHg. IAS/Shunts: The interatrial septum was not well visualized.  LEFT VENTRICLE PLAX 2D                        Biplane EF (MOD) LVIDd:         4.90 cm         LV Biplane EF:   Left LVIDs:         3.40 cm                          ventricular LV PW:         1.10 cm  ejection LV IVS:        1.00 cm                          fraction by LVOT diam:     2.10 cm                          2D MOD LV SV:         67                               biplane is LV SV Index:   30                               60.6 %. LVOT Area:     3.46 cm LV IVRT:       56 msec  LV Volumes (MOD) LV vol d, MOD    127.0 ml A2C: LV vol d, MOD    117.0 ml A4C: LV vol s, MOD    47.4 ml A2C: LV vol s, MOD    52.4 ml A4C: LV SV MOD A2C:   79.6 ml LV SV MOD A4C:   117.0 ml LV SV MOD BP:    76.8 ml RIGHT VENTRICLE            IVC RV Basal diam:  2.80 cm    IVC diam: 1.70 cm RV S prime:     9.36 cm/s TAPSE (M-mode): 1.7 cm     PULMONARY VEINS                            Diastolic Velocity: 30.00 cm/s                            S/D Velocity:       2.00                            Systolic Velocity:  59.10 cm/s LEFT ATRIUM             Index        RIGHT ATRIUM           Index LA diam:        3.00 cm 1.37 cm/m   RA Area:     11.10 cm LA Vol (A2C):   66.0 ml 30.04 ml/m  RA Volume:   22.60 ml  10.29 ml/m LA Vol (A4C):   57.6 ml 26.22 ml/m LA Biplane Vol: 66.3 ml 30.18 ml/m  AORTIC VALVE AV Area (Vmax):    1.41 cm AV Area (Vmean):   1.47 cm AV Area (VTI):     1.50 cm AV Vmax:           236.00 cm/s AV Vmean:          159.000 cm/s AV VTI:            0.446 m AV Peak Grad:      22.3 mmHg AV Mean Grad:      12.0 mmHg LVOT Vmax:         96.00 cm/s LVOT Vmean:        67.300 cm/s LVOT VTI:  0.193 m LVOT/AV VTI ratio: 0.43 AI PHT:            559 msec  AORTA Ao Root diam: 3.80 cm Ao Asc diam:  3.60 cm MITRAL VALVE MV Area (PHT): 2.97  cm    SHUNTS MV E velocity: 48.40 cm/s  Systemic VTI:  0.19 m MV A velocity: 74.10 cm/s  Systemic Diam: 2.10 cm MV E/A ratio:  0.65 Franck Azobou Tonleu Electronically signed by Joelle Cedars Tonleu Signature Date/Time: 04/29/2024/4:21:53 PM    Final    MR BRAIN WO CONTRAST Result Date: 04/29/2024 EXAM: MRI BRAIN WITHOUT CONTRAST 04/29/2024 12:37:25 PM TECHNIQUE: Multiplanar multisequence MRI of the head/brain was performed without the administration of intravenous contrast. COMPARISON: CT of the head dated 04/28/2024 and MRI of the head dated 03/20/2021. CLINICAL HISTORY: Seizure disorder, clinical change. FINDINGS: BRAIN AND VENTRICLES: No acute infarct. No intracranial hemorrhage. No mass. No midline shift. No hydrocephalus. Mild cerebral white matter disease. The hippocampi are symmetric in size and normal in signal intensity. There is no evidence of heterotopia or cortical dysplasia. There is no finding to explain seizure disorder. The sella is unremarkable. Normal flow voids. ORBITS: No acute abnormality. SINUSES AND MASTOIDS: No acute abnormality. BONES AND SOFT TISSUES: Normal marrow signal. There is a subcutaneous cyst present in the right posterior neck measuring approximately 4.4 x 3.7 x 4.6 cm. IMPRESSION: 1. No acute intracranial abnormality or epileptogenic focus identified to explain seizure disorder; symmetric hippocampi with normal signal, and no evidence of heterotopia or cortical dysplasia. 2. Mild cerebral white matter disease. 3. Subcutaneous cyst in the right posterior neck measuring approximately 4.4 x 3.7 x 4.6 cm. Electronically signed by: Evalene Coho MD 04/29/2024 12:42 PM EST RP Workstation: [id number]   CT Head Wo Contrast Result Date: 04/28/2024 EXAM: CT HEAD WITHOUT CONTRAST 04/28/2024 04:47:16 AM TECHNIQUE: CT of the head was performed without the administration of intravenous contrast. Automated exposure control, iterative reconstruction, and/or weight based adjustment  of the mA/kV was utilized to reduce the radiation dose to as low as reasonably achievable. COMPARISON: CT of the head dated 08/26/2021. CLINICAL HISTORY: Delirium. FINDINGS: BRAIN AND VENTRICLES: No acute hemorrhage. No evidence of acute infarct. No hydrocephalus. No extra-axial collection. No mass effect or midline shift. ORBITS: No acute abnormality. SINUSES: No acute abnormality. SOFT TISSUES AND SKULL: No acute soft tissue abnormality. No skull fracture. IMPRESSION: 1. No acute intracranial abnormality. Electronically signed by: Evalene Coho MD 04/28/2024 04:56 AM EST RP Workstation: [id number]   Labs:   Basic Metabolic Panel: Recent Labs  Lab 04/28/24 0144 04/29/24 0141 04/30/24 0432  NA 136 138 138  K 4.2 4.0 4.5  CL 94* 99 104  CO2 22 24 26   GLUCOSE 123* 104* 99  BUN 14 16 23   CREATININE 1.27* 1.01 0.86  CALCIUM 10.0 9.3 8.9  MG  --  2.6*  --   PHOS  --  2.3* 2.6     CBC: Recent Labs  Lab 04/28/24 0144 04/29/24 0141 04/30/24 0432  WBC 13.7* 12.0* 6.7  NEUTROABS 11.8*  --   --   HGB 16.1 15.2 12.9*  HCT 48.5 46.0 38.5*  MCV 86.1 86.3 86.3  PLT 222 202 171         SIGNED:   True Atlas, MD  [facility] 04/30/2024, 9:51 AM Time taking on discharge: 50 minutes

## 2024-05-01 ENCOUNTER — Other Ambulatory Visit (HOSPITAL_COMMUNITY): Payer: Self-pay
